# Patient Record
Sex: Female | Born: 1965
Health system: Southern US, Community
[De-identification: ages and names within clinical notes are randomized; demographics above are authoritative.]

## PROBLEM LIST (undated history)

## (undated) DIAGNOSIS — F419 Anxiety disorder, unspecified: Secondary | ICD-10-CM

## (undated) DIAGNOSIS — M7070 Other bursitis of hip, unspecified hip: Secondary | ICD-10-CM

## (undated) DIAGNOSIS — R112 Nausea with vomiting, unspecified: Secondary | ICD-10-CM

## (undated) DIAGNOSIS — R7303 Prediabetes: Secondary | ICD-10-CM

## (undated) DIAGNOSIS — Z973 Presence of spectacles and contact lenses: Secondary | ICD-10-CM

## (undated) DIAGNOSIS — M1990 Unspecified osteoarthritis, unspecified site: Secondary | ICD-10-CM

## (undated) DIAGNOSIS — R87629 Unspecified abnormal cytological findings in specimens from vagina: Secondary | ICD-10-CM

## (undated) DIAGNOSIS — N950 Postmenopausal bleeding: Secondary | ICD-10-CM

## (undated) DIAGNOSIS — Z9889 Other specified postprocedural states: Secondary | ICD-10-CM

## (undated) DIAGNOSIS — F32A Depression, unspecified: Secondary | ICD-10-CM

## (undated) DIAGNOSIS — I8390 Asymptomatic varicose veins of unspecified lower extremity: Secondary | ICD-10-CM

## (undated) DIAGNOSIS — M5441 Lumbago with sciatica, right side: Secondary | ICD-10-CM

## (undated) DIAGNOSIS — F3281 Premenstrual dysphoric disorder: Secondary | ICD-10-CM

## (undated) DIAGNOSIS — R51 Headache: Secondary | ICD-10-CM

## (undated) DIAGNOSIS — Z8742 Personal history of other diseases of the female genital tract: Secondary | ICD-10-CM

## (undated) DIAGNOSIS — Z87442 Personal history of urinary calculi: Secondary | ICD-10-CM

## (undated) DIAGNOSIS — E785 Hyperlipidemia, unspecified: Secondary | ICD-10-CM

## (undated) DIAGNOSIS — M5442 Lumbago with sciatica, left side: Secondary | ICD-10-CM

## (undated) DIAGNOSIS — F329 Major depressive disorder, single episode, unspecified: Secondary | ICD-10-CM

## (undated) HISTORY — DX: Unspecified abnormal cytological findings in specimens from vagina: R87.629

## (undated) HISTORY — PX: BREAST CYST EXCISION: SHX579

## (undated) HISTORY — DX: Premenstrual dysphoric disorder: F32.81

## (undated) HISTORY — PX: INCISE AND DRAIN ABCESS: PRO64

## (undated) HISTORY — DX: Hyperlipidemia, unspecified: E78.5

## (undated) HISTORY — PX: BLADDER SURGERY: SHX569

## (undated) HISTORY — DX: Major depressive disorder, single episode, unspecified: F32.9

## (undated) HISTORY — DX: Unspecified osteoarthritis, unspecified site: M19.90

## (undated) HISTORY — DX: Depression, unspecified: F32.A

## (undated) HISTORY — DX: Anxiety disorder, unspecified: F41.9

---

## 2001-03-18 ENCOUNTER — Encounter: Admission: RE | Admit: 2001-03-18 | Discharge: 2001-03-18 | Payer: Self-pay | Admitting: Obstetrics & Gynecology

## 2001-11-29 ENCOUNTER — Encounter: Admission: RE | Admit: 2001-11-29 | Discharge: 2001-11-29 | Payer: Self-pay | Admitting: *Deleted

## 2002-07-04 ENCOUNTER — Encounter: Admission: RE | Admit: 2002-07-04 | Discharge: 2002-07-04 | Payer: Self-pay | Admitting: *Deleted

## 2002-07-13 HISTORY — PX: TUBAL LIGATION: SHX77

## 2002-08-03 ENCOUNTER — Inpatient Hospital Stay (HOSPITAL_COMMUNITY): Admission: AD | Admit: 2002-08-03 | Discharge: 2002-08-03 | Payer: Self-pay | Admitting: *Deleted

## 2002-10-12 DIAGNOSIS — Z86718 Personal history of other venous thrombosis and embolism: Secondary | ICD-10-CM

## 2002-10-12 HISTORY — DX: Personal history of other venous thrombosis and embolism: Z86.718

## 2002-10-24 ENCOUNTER — Emergency Department (HOSPITAL_COMMUNITY): Admission: EM | Admit: 2002-10-24 | Discharge: 2002-10-24 | Payer: Self-pay

## 2002-10-26 ENCOUNTER — Inpatient Hospital Stay (HOSPITAL_COMMUNITY): Admission: EM | Admit: 2002-10-26 | Discharge: 2002-10-27 | Payer: Self-pay | Admitting: Emergency Medicine

## 2002-11-01 ENCOUNTER — Encounter: Admission: RE | Admit: 2002-11-01 | Discharge: 2002-11-01 | Payer: Self-pay | Admitting: *Deleted

## 2002-11-02 ENCOUNTER — Encounter: Admission: RE | Admit: 2002-11-02 | Discharge: 2002-11-02 | Payer: Self-pay | Admitting: Obstetrics and Gynecology

## 2002-11-07 ENCOUNTER — Encounter: Payer: Self-pay | Admitting: *Deleted

## 2002-11-07 ENCOUNTER — Encounter: Admission: RE | Admit: 2002-11-07 | Discharge: 2002-11-07 | Payer: Self-pay | Admitting: *Deleted

## 2002-11-07 ENCOUNTER — Ambulatory Visit (HOSPITAL_COMMUNITY): Admission: RE | Admit: 2002-11-07 | Discharge: 2002-11-07 | Payer: Self-pay | Admitting: *Deleted

## 2002-11-20 ENCOUNTER — Encounter: Admission: RE | Admit: 2002-11-20 | Discharge: 2002-11-20 | Payer: Self-pay | Admitting: *Deleted

## 2002-11-22 ENCOUNTER — Encounter: Admission: RE | Admit: 2002-11-22 | Discharge: 2002-11-22 | Payer: Self-pay | Admitting: *Deleted

## 2002-11-29 ENCOUNTER — Encounter: Admission: RE | Admit: 2002-11-29 | Discharge: 2002-11-29 | Payer: Self-pay | Admitting: *Deleted

## 2002-12-06 ENCOUNTER — Encounter: Admission: RE | Admit: 2002-12-06 | Discharge: 2002-12-06 | Payer: Self-pay | Admitting: *Deleted

## 2002-12-13 ENCOUNTER — Encounter: Admission: RE | Admit: 2002-12-13 | Discharge: 2002-12-13 | Payer: Self-pay | Admitting: *Deleted

## 2002-12-27 ENCOUNTER — Encounter: Admission: RE | Admit: 2002-12-27 | Discharge: 2002-12-27 | Payer: Self-pay | Admitting: *Deleted

## 2003-01-03 ENCOUNTER — Ambulatory Visit (HOSPITAL_COMMUNITY): Admission: RE | Admit: 2003-01-03 | Discharge: 2003-01-03 | Payer: Self-pay | Admitting: *Deleted

## 2003-01-10 ENCOUNTER — Encounter: Admission: RE | Admit: 2003-01-10 | Discharge: 2003-01-10 | Payer: Self-pay | Admitting: *Deleted

## 2003-01-17 ENCOUNTER — Encounter: Admission: RE | Admit: 2003-01-17 | Discharge: 2003-01-17 | Payer: Self-pay | Admitting: *Deleted

## 2003-01-21 ENCOUNTER — Inpatient Hospital Stay (HOSPITAL_COMMUNITY): Admission: AD | Admit: 2003-01-21 | Discharge: 2003-01-21 | Payer: Self-pay | Admitting: *Deleted

## 2003-01-25 ENCOUNTER — Encounter: Admission: RE | Admit: 2003-01-25 | Discharge: 2003-01-25 | Payer: Self-pay | Admitting: Family Medicine

## 2003-02-01 ENCOUNTER — Encounter: Admission: RE | Admit: 2003-02-01 | Discharge: 2003-02-01 | Payer: Self-pay | Admitting: Family Medicine

## 2003-02-06 ENCOUNTER — Inpatient Hospital Stay (HOSPITAL_COMMUNITY): Admission: AD | Admit: 2003-02-06 | Discharge: 2003-02-06 | Payer: Self-pay | Admitting: Obstetrics and Gynecology

## 2003-02-08 ENCOUNTER — Encounter: Admission: RE | Admit: 2003-02-08 | Discharge: 2003-02-08 | Payer: Self-pay | Admitting: Family Medicine

## 2003-02-09 ENCOUNTER — Ambulatory Visit (HOSPITAL_COMMUNITY): Admission: RE | Admit: 2003-02-09 | Discharge: 2003-02-09 | Payer: Self-pay | Admitting: *Deleted

## 2003-02-15 ENCOUNTER — Encounter: Admission: RE | Admit: 2003-02-15 | Discharge: 2003-02-15 | Payer: Self-pay | Admitting: *Deleted

## 2003-03-01 ENCOUNTER — Encounter: Admission: RE | Admit: 2003-03-01 | Discharge: 2003-03-01 | Payer: Self-pay | Admitting: Family Medicine

## 2003-03-08 ENCOUNTER — Encounter: Admission: RE | Admit: 2003-03-08 | Discharge: 2003-03-08 | Payer: Self-pay | Admitting: *Deleted

## 2003-03-15 ENCOUNTER — Encounter: Admission: RE | Admit: 2003-03-15 | Discharge: 2003-03-15 | Payer: Self-pay | Admitting: Family Medicine

## 2003-03-20 ENCOUNTER — Ambulatory Visit (HOSPITAL_COMMUNITY): Admission: RE | Admit: 2003-03-20 | Discharge: 2003-03-20 | Payer: Self-pay | Admitting: *Deleted

## 2003-03-22 ENCOUNTER — Encounter: Admission: RE | Admit: 2003-03-22 | Discharge: 2003-03-22 | Payer: Self-pay | Admitting: Family Medicine

## 2003-03-29 ENCOUNTER — Encounter: Admission: RE | Admit: 2003-03-29 | Discharge: 2003-03-29 | Payer: Self-pay | Admitting: *Deleted

## 2003-03-31 ENCOUNTER — Inpatient Hospital Stay (HOSPITAL_COMMUNITY): Admission: AD | Admit: 2003-03-31 | Discharge: 2003-03-31 | Payer: Self-pay | Admitting: Obstetrics & Gynecology

## 2003-04-01 ENCOUNTER — Inpatient Hospital Stay (HOSPITAL_COMMUNITY): Admission: AD | Admit: 2003-04-01 | Discharge: 2003-04-05 | Payer: Self-pay

## 2003-04-02 ENCOUNTER — Encounter (INDEPENDENT_AMBULATORY_CARE_PROVIDER_SITE_OTHER): Payer: Self-pay | Admitting: Specialist

## 2003-04-09 ENCOUNTER — Inpatient Hospital Stay (HOSPITAL_COMMUNITY): Admission: AD | Admit: 2003-04-09 | Discharge: 2003-04-09 | Payer: Self-pay | Admitting: Obstetrics and Gynecology

## 2003-04-11 ENCOUNTER — Inpatient Hospital Stay (HOSPITAL_COMMUNITY): Admission: AD | Admit: 2003-04-11 | Discharge: 2003-04-11 | Payer: Self-pay | Admitting: Obstetrics and Gynecology

## 2003-04-12 ENCOUNTER — Encounter: Admission: RE | Admit: 2003-04-12 | Discharge: 2003-04-12 | Payer: Self-pay | Admitting: Internal Medicine

## 2003-04-16 ENCOUNTER — Encounter: Admission: RE | Admit: 2003-04-16 | Discharge: 2003-04-16 | Payer: Self-pay | Admitting: Internal Medicine

## 2003-04-23 ENCOUNTER — Encounter: Admission: RE | Admit: 2003-04-23 | Discharge: 2003-04-23 | Payer: Self-pay | Admitting: Internal Medicine

## 2003-04-30 ENCOUNTER — Encounter: Admission: RE | Admit: 2003-04-30 | Discharge: 2003-04-30 | Payer: Self-pay | Admitting: Internal Medicine

## 2003-05-04 ENCOUNTER — Inpatient Hospital Stay (HOSPITAL_COMMUNITY): Admission: AD | Admit: 2003-05-04 | Discharge: 2003-05-04 | Payer: Self-pay | Admitting: *Deleted

## 2003-05-14 ENCOUNTER — Encounter: Admission: RE | Admit: 2003-05-14 | Discharge: 2003-05-14 | Payer: Self-pay | Admitting: Internal Medicine

## 2003-05-24 ENCOUNTER — Encounter: Admission: RE | Admit: 2003-05-24 | Discharge: 2003-05-24 | Payer: Self-pay | Admitting: Obstetrics and Gynecology

## 2003-05-24 ENCOUNTER — Encounter: Admission: RE | Admit: 2003-05-24 | Discharge: 2003-05-24 | Payer: Self-pay | Admitting: Internal Medicine

## 2003-06-04 ENCOUNTER — Encounter: Admission: RE | Admit: 2003-06-04 | Discharge: 2003-06-04 | Payer: Self-pay | Admitting: Internal Medicine

## 2003-06-14 ENCOUNTER — Ambulatory Visit (HOSPITAL_COMMUNITY): Admission: RE | Admit: 2003-06-14 | Discharge: 2003-06-14 | Payer: Self-pay | Admitting: Internal Medicine

## 2003-06-14 ENCOUNTER — Encounter: Admission: RE | Admit: 2003-06-14 | Discharge: 2003-06-14 | Payer: Self-pay | Admitting: Internal Medicine

## 2003-06-15 ENCOUNTER — Encounter: Admission: RE | Admit: 2003-06-15 | Discharge: 2003-06-15 | Payer: Self-pay | Admitting: Internal Medicine

## 2003-06-18 ENCOUNTER — Ambulatory Visit (HOSPITAL_COMMUNITY): Admission: RE | Admit: 2003-06-18 | Discharge: 2003-06-18 | Payer: Self-pay | Admitting: Internal Medicine

## 2003-06-22 ENCOUNTER — Encounter: Admission: RE | Admit: 2003-06-22 | Discharge: 2003-06-22 | Payer: Self-pay | Admitting: Internal Medicine

## 2003-06-25 ENCOUNTER — Encounter: Admission: RE | Admit: 2003-06-25 | Discharge: 2003-06-25 | Payer: Self-pay | Admitting: Internal Medicine

## 2003-06-29 ENCOUNTER — Encounter: Admission: RE | Admit: 2003-06-29 | Discharge: 2003-06-29 | Payer: Self-pay | Admitting: Internal Medicine

## 2003-08-20 ENCOUNTER — Encounter: Admission: RE | Admit: 2003-08-20 | Discharge: 2003-08-20 | Payer: Self-pay | Admitting: Internal Medicine

## 2003-10-19 ENCOUNTER — Ambulatory Visit (HOSPITAL_COMMUNITY): Admission: RE | Admit: 2003-10-19 | Discharge: 2003-10-19 | Payer: Self-pay | Admitting: Internal Medicine

## 2003-10-19 ENCOUNTER — Encounter: Admission: RE | Admit: 2003-10-19 | Discharge: 2003-10-19 | Payer: Self-pay | Admitting: Internal Medicine

## 2003-11-23 ENCOUNTER — Encounter: Admission: RE | Admit: 2003-11-23 | Discharge: 2003-11-23 | Payer: Self-pay | Admitting: Internal Medicine

## 2003-12-04 ENCOUNTER — Encounter: Admission: RE | Admit: 2003-12-04 | Discharge: 2003-12-04 | Payer: Self-pay | Admitting: Internal Medicine

## 2003-12-05 ENCOUNTER — Emergency Department (HOSPITAL_COMMUNITY): Admission: EM | Admit: 2003-12-05 | Discharge: 2003-12-05 | Payer: Self-pay | Admitting: Family Medicine

## 2003-12-13 ENCOUNTER — Encounter: Admission: RE | Admit: 2003-12-13 | Discharge: 2003-12-13 | Payer: Self-pay | Admitting: Internal Medicine

## 2003-12-20 ENCOUNTER — Encounter: Admission: RE | Admit: 2003-12-20 | Discharge: 2003-12-20 | Payer: Self-pay | Admitting: Internal Medicine

## 2003-12-27 ENCOUNTER — Encounter: Admission: RE | Admit: 2003-12-27 | Discharge: 2003-12-27 | Payer: Self-pay | Admitting: Internal Medicine

## 2004-01-07 ENCOUNTER — Encounter: Admission: RE | Admit: 2004-01-07 | Discharge: 2004-01-07 | Payer: Self-pay | Admitting: Internal Medicine

## 2004-01-28 ENCOUNTER — Encounter: Admission: RE | Admit: 2004-01-28 | Discharge: 2004-01-28 | Payer: Self-pay | Admitting: Internal Medicine

## 2004-02-11 ENCOUNTER — Encounter: Admission: RE | Admit: 2004-02-11 | Discharge: 2004-02-11 | Payer: Self-pay | Admitting: Internal Medicine

## 2004-02-20 ENCOUNTER — Encounter: Admission: RE | Admit: 2004-02-20 | Discharge: 2004-02-20 | Payer: Self-pay | Admitting: Internal Medicine

## 2004-03-10 ENCOUNTER — Encounter: Admission: RE | Admit: 2004-03-10 | Discharge: 2004-03-10 | Payer: Self-pay | Admitting: Internal Medicine

## 2004-03-31 ENCOUNTER — Ambulatory Visit: Payer: Self-pay | Admitting: Internal Medicine

## 2004-04-21 ENCOUNTER — Ambulatory Visit: Payer: Self-pay | Admitting: Internal Medicine

## 2004-05-19 ENCOUNTER — Ambulatory Visit: Payer: Self-pay | Admitting: Internal Medicine

## 2004-05-30 ENCOUNTER — Ambulatory Visit (HOSPITAL_COMMUNITY): Admission: RE | Admit: 2004-05-30 | Discharge: 2004-05-30 | Payer: Self-pay | Admitting: Internal Medicine

## 2004-05-30 ENCOUNTER — Ambulatory Visit: Payer: Self-pay | Admitting: Internal Medicine

## 2004-05-30 ENCOUNTER — Encounter: Payer: Self-pay | Admitting: Family Medicine

## 2004-06-02 ENCOUNTER — Ambulatory Visit: Payer: Self-pay | Admitting: Internal Medicine

## 2004-06-30 ENCOUNTER — Ambulatory Visit: Payer: Self-pay | Admitting: Internal Medicine

## 2004-07-28 ENCOUNTER — Ambulatory Visit: Payer: Self-pay | Admitting: Internal Medicine

## 2004-08-25 ENCOUNTER — Ambulatory Visit: Payer: Self-pay | Admitting: Internal Medicine

## 2004-08-27 ENCOUNTER — Ambulatory Visit: Payer: Self-pay | Admitting: Internal Medicine

## 2004-08-28 ENCOUNTER — Ambulatory Visit: Payer: Self-pay | Admitting: Internal Medicine

## 2004-09-03 ENCOUNTER — Ambulatory Visit: Payer: Self-pay | Admitting: Internal Medicine

## 2004-09-08 ENCOUNTER — Ambulatory Visit: Payer: Self-pay | Admitting: Internal Medicine

## 2004-09-10 ENCOUNTER — Ambulatory Visit (HOSPITAL_COMMUNITY): Admission: RE | Admit: 2004-09-10 | Discharge: 2004-09-10 | Payer: Self-pay | Admitting: Internal Medicine

## 2004-10-06 ENCOUNTER — Ambulatory Visit: Payer: Self-pay | Admitting: Internal Medicine

## 2004-11-03 ENCOUNTER — Ambulatory Visit: Payer: Self-pay | Admitting: Internal Medicine

## 2004-11-11 ENCOUNTER — Ambulatory Visit: Payer: Self-pay | Admitting: Internal Medicine

## 2004-12-01 ENCOUNTER — Ambulatory Visit: Payer: Self-pay | Admitting: Internal Medicine

## 2004-12-02 ENCOUNTER — Ambulatory Visit: Payer: Self-pay | Admitting: Internal Medicine

## 2004-12-09 ENCOUNTER — Other Ambulatory Visit: Admission: RE | Admit: 2004-12-09 | Discharge: 2004-12-09 | Payer: Self-pay | Admitting: Internal Medicine

## 2004-12-09 ENCOUNTER — Encounter (INDEPENDENT_AMBULATORY_CARE_PROVIDER_SITE_OTHER): Payer: Self-pay | Admitting: *Deleted

## 2004-12-09 ENCOUNTER — Ambulatory Visit: Payer: Self-pay | Admitting: Internal Medicine

## 2004-12-11 ENCOUNTER — Ambulatory Visit: Payer: Self-pay | Admitting: Internal Medicine

## 2004-12-15 ENCOUNTER — Ambulatory Visit: Payer: Self-pay | Admitting: Internal Medicine

## 2004-12-16 ENCOUNTER — Ambulatory Visit: Payer: Self-pay | Admitting: Internal Medicine

## 2005-01-05 ENCOUNTER — Ambulatory Visit: Payer: Self-pay | Admitting: Internal Medicine

## 2005-02-02 ENCOUNTER — Ambulatory Visit: Payer: Self-pay | Admitting: Internal Medicine

## 2005-03-02 ENCOUNTER — Inpatient Hospital Stay (HOSPITAL_COMMUNITY): Admission: EM | Admit: 2005-03-02 | Discharge: 2005-03-04 | Payer: Self-pay | Admitting: Emergency Medicine

## 2005-03-02 ENCOUNTER — Ambulatory Visit: Payer: Self-pay | Admitting: Obstetrics & Gynecology

## 2005-03-02 ENCOUNTER — Encounter: Payer: Self-pay | Admitting: Obstetrics and Gynecology

## 2005-03-04 ENCOUNTER — Ambulatory Visit: Payer: Self-pay | Admitting: Oncology

## 2005-03-19 ENCOUNTER — Ambulatory Visit: Payer: Self-pay | Admitting: Family Medicine

## 2005-03-24 ENCOUNTER — Ambulatory Visit (HOSPITAL_COMMUNITY): Admission: RE | Admit: 2005-03-24 | Discharge: 2005-03-24 | Payer: Self-pay | Admitting: Family Medicine

## 2005-03-26 ENCOUNTER — Ambulatory Visit: Payer: Self-pay | Admitting: Internal Medicine

## 2005-04-09 ENCOUNTER — Ambulatory Visit: Payer: Self-pay | Admitting: Internal Medicine

## 2005-10-20 ENCOUNTER — Ambulatory Visit: Payer: Self-pay | Admitting: Hospitalist

## 2005-10-21 ENCOUNTER — Ambulatory Visit: Payer: Self-pay | Admitting: Internal Medicine

## 2006-01-14 ENCOUNTER — Encounter (INDEPENDENT_AMBULATORY_CARE_PROVIDER_SITE_OTHER): Payer: Self-pay | Admitting: Internal Medicine

## 2006-01-14 ENCOUNTER — Ambulatory Visit: Payer: Self-pay | Admitting: Internal Medicine

## 2006-01-21 ENCOUNTER — Encounter: Admission: RE | Admit: 2006-01-21 | Discharge: 2006-01-21 | Payer: Self-pay | Admitting: Internal Medicine

## 2006-01-21 ENCOUNTER — Encounter (INDEPENDENT_AMBULATORY_CARE_PROVIDER_SITE_OTHER): Payer: Self-pay | Admitting: Internal Medicine

## 2006-04-28 DIAGNOSIS — R6882 Decreased libido: Secondary | ICD-10-CM | POA: Insufficient documentation

## 2006-04-28 DIAGNOSIS — F329 Major depressive disorder, single episode, unspecified: Secondary | ICD-10-CM | POA: Insufficient documentation

## 2006-04-28 DIAGNOSIS — N760 Acute vaginitis: Secondary | ICD-10-CM | POA: Insufficient documentation

## 2006-04-28 DIAGNOSIS — N643 Galactorrhea not associated with childbirth: Secondary | ICD-10-CM | POA: Insufficient documentation

## 2006-04-28 DIAGNOSIS — F3289 Other specified depressive episodes: Secondary | ICD-10-CM | POA: Insufficient documentation

## 2008-06-05 ENCOUNTER — Ambulatory Visit: Payer: Self-pay | Admitting: Internal Medicine

## 2008-06-05 ENCOUNTER — Encounter (INDEPENDENT_AMBULATORY_CARE_PROVIDER_SITE_OTHER): Payer: Self-pay | Admitting: Internal Medicine

## 2008-06-05 DIAGNOSIS — M25569 Pain in unspecified knee: Secondary | ICD-10-CM | POA: Insufficient documentation

## 2008-06-06 LAB — CONVERTED CEMR LAB
ALT: 12 units/L (ref 0–35)
AST: 14 units/L (ref 0–37)
Albumin: 3.9 g/dL (ref 3.5–5.2)
Alkaline Phosphatase: 61 units/L (ref 39–117)
BUN: 17 mg/dL (ref 6–23)
CO2: 19 meq/L (ref 19–32)
Calcium: 8.3 mg/dL — ABNORMAL LOW (ref 8.4–10.5)
Chloride: 106 meq/L (ref 96–112)
Cholesterol: 213 mg/dL — ABNORMAL HIGH (ref 0–200)
Creatinine, Ser: 0.7 mg/dL (ref 0.40–1.20)
Free T4: 1.21 ng/dL (ref 0.89–1.80)
Glucose, Bld: 92 mg/dL (ref 70–99)
HCT: 42.9 % (ref 36.0–46.0)
HDL: 45 mg/dL (ref >39)
Hemoglobin: 14.2 g/dL (ref 12.0–15.0)
LDL Cholesterol: 143 mg/dL — ABNORMAL HIGH (ref 0–99)
MCHC: 33.1 g/dL (ref 30.0–36.0)
MCV: 94.3 fL (ref 78.0–100.0)
Platelets: 296 10*3/uL (ref 150–400)
Potassium: 4.4 meq/L (ref 3.5–5.3)
RBC: 4.55 M/uL (ref 3.87–5.11)
RDW: 13.9 % (ref 11.5–15.5)
Sodium: 138 meq/L (ref 135–145)
TSH: 0.488 microintl units/mL (ref 0.350–4.50)
Total Bilirubin: 0.4 mg/dL (ref 0.3–1.2)
Total CHOL/HDL Ratio: 4.7
Total Protein: 6.6 g/dL (ref 6.0–8.3)
Triglycerides: 127 mg/dL (ref <150)
VLDL: 25 mg/dL (ref 0–40)
WBC: 4.3 10*3/uL (ref 4.0–10.5)

## 2008-07-17 ENCOUNTER — Encounter: Payer: Self-pay | Admitting: Gynecology

## 2008-07-17 ENCOUNTER — Other Ambulatory Visit: Admission: RE | Admit: 2008-07-17 | Discharge: 2008-07-17 | Payer: Self-pay | Admitting: Gynecology

## 2008-07-17 ENCOUNTER — Encounter (INDEPENDENT_AMBULATORY_CARE_PROVIDER_SITE_OTHER): Payer: Self-pay | Admitting: Internal Medicine

## 2008-07-17 ENCOUNTER — Ambulatory Visit: Payer: Self-pay | Admitting: Gynecology

## 2008-07-18 ENCOUNTER — Encounter: Admission: RE | Admit: 2008-07-18 | Discharge: 2008-07-18 | Payer: Self-pay | Admitting: Gynecology

## 2008-08-08 ENCOUNTER — Ambulatory Visit: Payer: Self-pay | Admitting: Gynecology

## 2008-08-15 ENCOUNTER — Ambulatory Visit: Payer: Self-pay | Admitting: Gynecology

## 2008-08-29 ENCOUNTER — Encounter: Admission: RE | Admit: 2008-08-29 | Discharge: 2008-08-29 | Payer: Self-pay | Admitting: Family Medicine

## 2009-08-27 ENCOUNTER — Ambulatory Visit: Payer: Self-pay | Admitting: Gynecology

## 2009-08-27 ENCOUNTER — Other Ambulatory Visit: Admission: RE | Admit: 2009-08-27 | Discharge: 2009-08-27 | Payer: Self-pay | Admitting: Gynecology

## 2009-08-28 ENCOUNTER — Encounter: Admission: RE | Admit: 2009-08-28 | Discharge: 2009-08-28 | Payer: Self-pay | Admitting: Gynecology

## 2009-08-29 ENCOUNTER — Ambulatory Visit: Payer: Self-pay | Admitting: Gynecology

## 2009-11-11 ENCOUNTER — Ambulatory Visit: Payer: Self-pay | Admitting: Gynecology

## 2009-12-10 ENCOUNTER — Ambulatory Visit: Payer: Self-pay | Admitting: Gynecology

## 2010-08-03 ENCOUNTER — Encounter: Payer: Self-pay | Admitting: Family Medicine

## 2010-08-12 NOTE — Assessment & Plan Note (Signed)
Summary: RA/COMPL PHYSICAL/NOT SEEN SINCE 07/VS   Vital Signs:  Patient Profile:   45 Years Old Female Height:     61 inches (154.94 cm) Weight:      162.05 pounds (73.66 kg) BMI:     30.73 Temp:     96.8 degrees F (36 degrees C) oral Pulse rate:   53 / minute BP sitting:   94 / 57  (right arm)  Pt. in pain?   no  Vitals Entered By: Angelina Ok RN (June 05, 2008 8:35 AM)              Is Patient Diabetic? No Nutritional Status BMI of > 30 = obese  Have you ever been in a relationship where you felt threatened, hurt or afraid?No   Does patient need assistance? Functional Status Self care Ambulation Normal     Visit Type:  new visit  PCP:  Carlus Pavlov MD  Chief Complaint:  Check up Standing 86/56 P 58.  History of Present Illness: This is a 45 y/o woman with PMH of  VAGINITIS (ICD-616.10) DECREASED LIBIDO (ICD-799.81) TUBAL LIGATION, HX OF (ICD-V26.51) GALACTORRHEA (ICD-611.6) DVT (ICD-451.19) DEPRESSION (ICD-311) lost for f/u since EMR (2007), now presenting for a complete physical.  She was on coumadin for a DVT that she had while she was pregnant in 2004. She is not on it anymore. No more DVTs.  No recent hospitalizations. No more vaginal problems. She still feels depressed but much better. She has been started on a med for depression in the past by [place], but does not remember what med. She is now taking Remotiv (hypericum perforatum, [place], likely [place] John's Wort) from [place]. She started taking them 1 month ago and they work, she says. She has a lot of stress at work.  She also takes Meloxicam for right knee pain. She seen DR. John Rendall (ortho). She is on an exercise program by him.     Depression History:      The patient is having a depressed mood most of the day but denies diminished interest in her usual daily activities.        The patient denies that she feels like life is not worth living, denies that she wishes that she were  dead, and denies that she has thought about ending her life.        Comments:  Feeling better since on medications.  Thinking about  her child.  .     Prior Medications Reviewed Using: Medication Bottles  Updated Prior Medication List: ST JOHNS WORT 300 MG TABS (ST JOHNS WORT) Take 1 tablet by mouth two times a day MELOXICAM 7.5 MG TABS (MELOXICAM) Take 1 tablet by mouth once a day as needed for pain * DIACEREINE-MELOXICAM 50-15 MG take 1 tablet by mouth as needed for pain  Current Allergies: No known allergies    Family History:    Mother: DM2, diagnosed 01/2008    Grandmother: DM2    Father: killed long time ago    Children: 3, healthy: 4,17 and 29 y/o  Social History:    Married, 3 children    Works in a [employer] on [employer]    Never Smoked    Alcohol use-no    Drug use-no    Regular exercise-no    Lives in [place]   Risk Factors:  Tobacco use:  never Drug use:  no Alcohol use:  no Exercise:  no   Review of Systems  She had sore throat 2 weeks ago. She had the flu shot.  No F/C/N/V/D/C/CP/SOB/AP/dysuria/vaginal discharge/blood in stool or urine/black stools She has leg swelling especially when she stands a lot (works in [employer]).   Physical Exam  General:     alert and overweight-appearing.   Eyes:     vision grossly intact, pupils equal, pupils round, and pupils reactive to light.   Mouth:     pharynx pink and moist.   Lungs:     normal respiratory effort, no crackles, and no wheezes.   Heart:     normal rate, regular rhythm, no murmur, and no gallop.   Abdomen:     soft, non-tender, normal bowel sounds, no distention, no guarding, and no rigidity.   Extremities:     no edema varices left calf Psych:     pleasant, normally interactive, nicely dressed    Impression & Recommendations:  Problem # 1:  DEPRESSION (ICD-311) Assessment: Improved Her depression seems to be triggered by the stress at work. St John's  Wort is working well for her and I encouraged her to continue since there are data showing that it is moderately effective in depression and no SEs.  Orders: T-TSH [id number]) T-T4, Free [phone])   Problem # 2:  Preventive Health Care (ICD-V70.0) Assessment: Comment Only Mammogram: had a mammogram last year. She goes to [facility] and gets letters in mail to get them.  PAP: last year in January. She has her period now but agrees to have them done by [place].   Problem # 3:  KNEE PAIN, RIGHT, CHRONIC (ICD-719.46) Assessment: Unchanged Pt has OA with chronic knee pain and sees an orthopedist. He prescribed Meloxicam 7.5 mg daily but pt is also taking a combination tablet that contains meloxicam 15 mg and she takes it two times a day. That mounts to 37.5 mg Meloxicam a day! Max dose is 15! Explained to her that she can take 7.5 two times a day or 15 daily but not more than that. She agrees.  Her updated medication list for this problem includes:    Meloxicam 7.5 Mg Tabs (Meloxicam) .Marland Kitchen... Take 1 tablet by mouth once a day as needed for pain   Complete Medication List: 1)  St Johns Wort 300 Mg Tabs ([address] johns wort) .... Take 1 tablet by mouth two times a day 2)  Meloxicam 7.5 Mg Tabs (Meloxicam) .... Take 1 tablet by mouth once a day as needed for pain 3)  Diacereine-meloxicam 50-15 Mg  .... Take 1 tablet by mouth as needed for pain  Other Orders: T-Comprehensive Metabolic Panel [id number]) T-Lipid Profile ([id number]) T-CBC No Diff ([id number])   Patient Instructions: 1)  Please schedule a follow-up appointment in 3 months for a PAP smear and general check-up.   ]

## 2010-08-13 ENCOUNTER — Other Ambulatory Visit: Payer: Self-pay | Admitting: Gynecology

## 2010-08-13 DIAGNOSIS — Z1239 Encounter for other screening for malignant neoplasm of breast: Secondary | ICD-10-CM

## 2010-09-01 ENCOUNTER — Other Ambulatory Visit (HOSPITAL_COMMUNITY)
Admission: RE | Admit: 2010-09-01 | Discharge: 2010-09-01 | Disposition: A | Discharge status: Home / Self Care | Payer: BC Managed Care – PPO | Source: Ambulatory Visit | Attending: Gynecology | Admitting: Gynecology

## 2010-09-01 ENCOUNTER — Other Ambulatory Visit: Payer: Self-pay | Admitting: Gynecology

## 2010-09-01 ENCOUNTER — Encounter (INDEPENDENT_AMBULATORY_CARE_PROVIDER_SITE_OTHER): Payer: BC Managed Care – PPO | Admitting: Gynecology

## 2010-09-01 DIAGNOSIS — Z833 Family history of diabetes mellitus: Secondary | ICD-10-CM

## 2010-09-01 DIAGNOSIS — B373 Candidiasis of vulva and vagina: Secondary | ICD-10-CM

## 2010-09-01 DIAGNOSIS — Z13220 Encounter for screening for lipoid disorders: Secondary | ICD-10-CM

## 2010-09-01 DIAGNOSIS — Z01419 Encounter for gynecological examination (general) (routine) without abnormal findings: Secondary | ICD-10-CM

## 2010-09-01 DIAGNOSIS — Z124 Encounter for screening for malignant neoplasm of cervix: Secondary | ICD-10-CM | POA: Insufficient documentation

## 2010-09-01 DIAGNOSIS — R635 Abnormal weight gain: Secondary | ICD-10-CM

## 2010-09-05 ENCOUNTER — Institutional Professional Consult (permissible substitution) (INDEPENDENT_AMBULATORY_CARE_PROVIDER_SITE_OTHER): Payer: BC Managed Care – PPO | Admitting: Gynecology

## 2010-09-05 DIAGNOSIS — E7800 Pure hypercholesterolemia, unspecified: Secondary | ICD-10-CM

## 2010-10-06 ENCOUNTER — Ambulatory Visit
Admission: RE | Admit: 2010-10-06 | Discharge: 2010-10-06 | Disposition: A | Discharge status: Home / Self Care | Payer: BC Managed Care – PPO | Source: Ambulatory Visit | Attending: Gynecology | Admitting: Gynecology

## 2010-10-06 DIAGNOSIS — Z1239 Encounter for other screening for malignant neoplasm of breast: Secondary | ICD-10-CM

## 2010-11-28 NOTE — Consult Note (Signed)
NAME:  Taylor Parker     ACCOUNT NO.:  [id number]   MEDICAL RECORD NO.:  [id number]          PATIENT TYPE:  INP   LOCATION:  1621                         FACILITY:  [facility]   PHYSICIAN:  Leighton Roach. Truett Perna, M.D. DATE OF BIRTH:  1966-06-06   DATE OF CONSULTATION:  03/02/2005  DATE OF DISCHARGE:                                   CONSULTATION   REFERRED BY:  Velora Heckler, MD.   PATIENT IDENTIFICATION:  [name] is a 45 year old admitted for evaluation  of possible appendicitis. She is maintained on Coumadin and the PTT is  supratherapeutic.   HISTORY OF PRESENT ILLNESS:  [name] was diagnosed with a deep vein  thrombosis of the left leg while pregnant in April 2004. She was treated  with subcutaneous heparin throughout the pregnancy and started Coumadin  after delivery in September 2004.   She has been maintained on Coumadin for the past 2 years. She is followed at  the [facility].   [name] presented today with a 5-day history of right abdominal pain. She  had no associated symptoms. She was evaluated at [facility] and a CT  scan of the abdomen and pelvis revealed changes consistent with appendicitis  and a hemorrhagic ovarian cyst. She was referred to the [facility]  Emergency Room for further evaluation.   PAST MEDICAL HISTORY:  1.  G3, P3, last menstrual cycle was February 01, 2005.  2.  Deep vein thrombosis 2004 while pregnant.   PAST SURGICAL HISTORY:  1.  Status post bilateral tubal ligation.  2.  Status post cesarean section.   ALLERGIES:  DILAUDID causes shortness of breath.   CURRENT MEDICATIONS:  Coumadin 5 mg daily.   FAMILY HISTORY:  No family history of venous thromboembolic disease.   SOCIAL HISTORY:  She does not use tobacco or alcohol. She does not speak  [place].   REVIEW OF SYSTEMS:  Negative for bleeding, fever and nausea/vomiting.   PHYSICAL EXAMINATION:  VITAL SIGNS:  Blood pressure 93/56, pulse 58,  temperature 97, oxygen saturation 99% on room air.  LUNGS:  Clear.  CARDIAC:  Regular rhythm.  ABDOMEN:  Soft. No hepatosplenomegaly. No mass. Mild tenderness with deep  palpation in the right low abdomen.  EXTREMITIES:  No edema. Varicosities at the left leg below the knee.   LABS:  Hemoglobin 11.7, MCV 80.1, white count 7, platelets 262,000, ANC 4.2,  BUN 8, creatinine 0.7, PTT 65 seconds, PT 43.1 seconds. (PT/INR 4.6).   IMPRESSION:  1.  Abdominal pain -? appendicitis.  2.  Hemorrhagic ovarian cyst.  3.  Elevated PT and PTT.  4.  History of left lower extremity deep vein thrombosis, 2004, maintained      on chronic Coumadin anticoagulation.  5.  Microcytic anemia.   [name] is maintained on long-term Coumadin. She presents with right  lower quadrant pain and CT findings suggestive of appendicitis. We will give  vitamin K, mini-dose , to reverse the Coumadin over the next 12-24 hours.  We can give FFP if urgent surgery is needed for treatment of appendicitis or  the ovarian cyst.   We will contact the  medicine clinic to clarify the indication for long-term  anticoagulation in her case. The increased PT and PTT are both likely due to  an effect of Coumadin.   RECOMMENDATIONS:  1.  Vitamin K.  2.  FFP if urgent reversal of Coumadin is needed for surgery.  3.  Check ferritin level.  4.  Repeat PT/PTT in a.m. August 22.  5.  Early ambulation/Lovenox following surgery with the plan to resume      Coumadin based on a review of records from the medicine clinic.   Thank you for this consultation. We will follow [name] with the surgical  service while in the hospital.           ______________________________  Leighton Roach. Truett Perna, M.D.     GBS/MEDQ  D:  03/02/2005  T:  03/03/2005  Job:  [id number]   cc:   Velora Heckler, MD  [address]  [place]  [place] [address]   Lesly Dukes, M.D.   [facility]

## 2010-11-28 NOTE — Op Note (Signed)
NAME:  Taylor Parker, Taylor Parker                 ACCOUNT NO.:  [id number]   MEDICAL RECORD NO.:  [id number]                   PATIENT TYPE:  INP   LOCATION:  9119                                 FACILITY:  [facility]   PHYSICIAN:  Phil D. Okey Dupre, M.D.                  DATE OF BIRTH:  Oct 11, 1965   DATE OF PROCEDURE:  04/02/2003  DATE OF DISCHARGE:                                 OPERATIVE REPORT   PREOPERATIVE DIAGNOSES:  1. Repeat cesarean section.  2. Voluntary sterilization.   POSTOPERATIVE DIAGNOSES:  1. Repeat cesarean section.  2. Voluntary sterilization.   PROCEDURE:  Low flap cesarean section and bilateral tubal ligation, modified  [place].   SURGEON:  Javier Glazier. Rose, M.D.   ESTIMATED BLOOD LOSS:  700 mL.   ANESTHESIA:  Spinal anesthesia.   DESCRIPTION OF PROCEDURE:  Under satisfactory spinal anesthesia with the  patient in the dorsal supine position, Foley catheter in urinary bladder,  the abdomen was prepped and draped in the usual sterile manner and entered  through a midline incision through a previous scar situated just underneath  the umbilicus and extended to the symphysis pubis.  The abdomen was entered  by layers.  After entering the peritoneal cavity, the visceral peritoneum  and the anterior surface of the uterus opened transversely by sharp  dissection.  Bladder pushed away from the lower uterine segment which was  entered by sharp and blunt dissection and from an LOT presentation.  The  baby was easily delivered.  The cord doubly clamped, divided, baby handed to  pediatrician after mouth had been suctioned with a suction bulb.  Samples of  blood taken from the cord for analysis. The placenta spontaneously removed.  The uterus explored and closed with a continuous running locked 0 Vicryl and  atraumatic needle.  Each fallopian tube was then grasped in the mid portion  with Babcock clamp and an opening made in the avascular portion of meso  beneath the tube with a  hemostat through which was drawn a #1 plain catgut  suture tied around the distal and proximal sides in the tube to form a loop  of 2 cm above the tie.  Then the section of tube above the tie was removed  after the second suture was placed beneath the aforementioned and the ends  of the tube coagulated with hot cautery.  The fascia was then closed with a  double strand PDS 0 with a loop and a running suture.  Skin edges  approximated with skin staples.  Dry sterile dressing applied.  There was a  700 mL blood loss.  The Foley catheter draining clear amber urine at the end  of the procedure.  The patient tolerated the procedure well and was  transferred to the recovery room in satisfactory condition.  Phil D. Okey Dupre, M.D.    PDR/MEDQ  D:  04/02/2003  T:  04/02/2003  Job:  [id number]

## 2010-11-28 NOTE — Discharge Summary (Signed)
NAME:  Taylor Parker, Taylor Parker     ACCOUNT NO.:  [id number]   MEDICAL RECORD NO.:  [id number]          PATIENT TYPE:  INP   LOCATION:  1621                         FACILITY:  [facility]   PHYSICIAN:  Velora Heckler, MD      DATE OF BIRTH:  [birth year]   DATE OF ADMISSION:  03/02/2005  DATE OF DISCHARGE:                                 DISCHARGE SUMMARY   REASON FOR ADMISSION:  Abdominal pain, coagulopathy.   HISTORY OF PRESENT ILLNESS:  The patient is 45 year old Hispanic female who  presented to the emergency department [facility] with 5-day history of  lower abdominal pain. The patient was referred to general surgery at [facility]. The patient had been on Coumadin for distant history deep  venous thrombosis. She was found to be coagulopathic with an INR of 4.6.  General surgery was called to evaluate the patient for possible  appendicitis, hemorrhagic ovarian cyst, and coagulopathy.   HOSPITAL COURSE:  The patient was admitted on the general surgical service.  She was seen in consultation by Dr. Mancel Bale from hematology.  Anticoagulation was stopped. Vitamin K was administered. The patient's  coagulopathy gradually resolved. Clinically the patient demonstrated no  signs or symptoms of acute appendicitis, despite CT scan findings. White  blood cell count was normal. Vital signs were perfectly stable. Abdominal  exam was completely benign. The patient had no fever, no chills. The patient  tolerated clear liquid diet was advanced to a regular diet. She was seen in  follow-up by gynecology. She was followed by Dr. Mancel Bale. Review of  her medical records failed to document conclusive evidence of protein S  deficiency. Therefore a decision was made to stop her Coumadin therapy. The  patient was prepared for discharge home on the third hospital day.   DISCHARGE/PLAN:  The patient is discharged home today August 23,2006, good  condition, tolerating a regular diet,  ambulating independently. Discharge  medications include iron sulfate 324 milligrams twice daily for 1 month. The  patient will be seen back in the gynecology clinic in 2 weeks. The patient  will be seen at the [facility] outpatient medicine clinic in 2 weeks.   FINAL DIAGNOSIS:  1.  Abdominal pain, resolved.  2.  Ovarian cyst.  3.  Coagulopathy.   CONDITION ON DISCHARGE:  Improved.      Velora Heckler, MD  Electronically Signed     TMG/MEDQ  D:  03/04/2005  T:  03/04/2005  Job:  [id number]   cc:   Lesly Dukes, M.D.   Leighton Roach. Truett Perna, M.D.  [address] [facility]  [place]  [place] [address]  Fax: [phone]

## 2010-11-28 NOTE — H&P (Signed)
NAME:  Taylor Parker, Taylor Parker     ACCOUNT NO.:  [id number]   MEDICAL RECORD NO.:  [id number]          PATIENT TYPE:  INP   LOCATION:  0105                         FACILITY:  [facility]   PHYSICIAN:  Velora Heckler, MD      DATE OF BIRTH:  04/21/1966   DATE OF ADMISSION:  03/02/2005  DATE OF DISCHARGE:                                HISTORY & PHYSICAL   REFERRING PHYSICIAN:  Lesly Dukes, M.D.   CHIEF COMPLAINT:  Five day history of abdominal pain.   HISTORY OF PRESENT ILLNESS:  Patient is [name] who  presents to the emergency department at [facility] with a five day  history of lower abdominal pain.  Patient denies nausea or vomiting.  She  denies fevers or chills.  She notes normal bowel movements.  Patient was  seen and evaluated in the emergency department.  She was noted to be  coagulopathic on Coumadin with an INR of 4.6 and an elevated PTT of 65.  She  had been on Coumadin for two years since a deep venous thrombosis was  diagnosed during her last pregnancy.  Patient underwent a CT scan of the  abdomen and pelvis, which documented an apparent ovarian cyst with  hemorrhage.  Appendicitis was also felt to be represented on CT scan.  General surgery was called by gynecology, and the patient was transferred  from [facility] to the [facility] emergency department for  general surgical assessment.  Patient is also being seen by Dr. Mancel Bale due to her coagulopathy and possible history of protein S  deficiency.   HISTORY OF PRESENT ILLNESS:  1.  Cesarean section x3.  2.  History of deep venous thrombosis during her third pregnancy in 2004.   MEDICATIONS:  Coumadin.   ALLERGIES:  DILAUDID, causing shortness of breath.   FAMILY HISTORY:  Unremarkable.   SOCIAL HISTORY:  Patient lives in [place].  She has three children.  She  denies tobacco use.  She denies alcohol use.  She speaks limited [place].   REVIEW OF SYSTEMS:  The  15-system review discussed through an interpreter  without significant other positives.   PHYSICAL EXAMINATION:  VITAL SIGNS:  Temp 99.2, pulse 69, respirations 18,  blood pressure 83/47.  GENERAL:  A 45 year old well-developed and well-nourished Hispanic female in  no acute distress on a stretcher in the emergency department.  HEENT:  Normocephalic and atraumatic.  Sclerae are clear.  Conjunctivae are  clear.  Pupils are equal and reactive.  Dentition fair.  Moist mucous  membranes.  Voice is normal.  NECK:  Supple without mass.  Thyroid normal without nodularity.  There is no  tenderness.  There is no lymphadenopathy.  Supraclavicular fossae are  without mass.  LUNGS:  Clear to auscultation bilaterally without rales, rhonchi or wheezes.  HEART:  Regular rate and rhythm without murmur.  Peripheral pulses are full.  ABDOMEN:  Soft without distention.  There is a well-healed lower midline  surgical wound.  There is no sign of hernia.  There is no  hepatosplenomegaly.  There are no palpable masses.  There is no guarding.  There is no rebound.  EXTREMITIES:  Nontender without edema.  NEUROLOGIC:  Patient is alert and oriented without focal deficit.   LABORATORY STUDIES:  White count 7, hemoglobin 11.7, platelet count 262,000.  Differential is normal.  Electrolytes are normal.  PTT is elevated at 65.  INR is elevated at 4.6.  Urinalysis is benign.   RADIOGRAPHIC STUDIES:  CT scan of the abdomen and pelvis by report showing  hemorrhagic ovarian cyst and possible appendicitis.   IMPRESSION:  1.  Coagulopathy secondary to Coumadin therapy.  2.  Hemorrhagic ovarian cyst.  3.  Rule out appendicitis.   PLAN:  The patient has already been seen by Dr. Mancel Bale from  hematology.  He will be reversing her over-anticoagulation.  He will also  make a determination as to whether she has some kind of underlying genetic  coagulopathy or if her anticoagulation therapy may be discontinued safely  at  the present time.  The patient's CT scan of the abdomen and pelvis will be  reviewed this evening with the radiologist.  Clinically, the patient  exhibits no signs of acute appendicitis.  At this point, no operative  intervention is planned.  Coagulopathy will be reversed, and the patient  will be observed carefully here as an inpatient at [facility].  I will notify the gynecologist of this plan.      Velora Heckler, MD  Electronically Signed     TMG/MEDQ  D:  03/02/2005  T:  03/02/2005  Job:  [id number]   cc:   Lesly Dukes, M.D.   Leighton Roach. Truett Perna, M.D.  [address] [facility]  [place]  [place] [address]  Fax: [phone]

## 2010-11-28 NOTE — Consult Note (Signed)
[place]  Patient:    Taylor Parker, Taylor Parker Visit Number: [id number] MRN: [id number]          Service Type: Attending:  Elinor Dodge, M.D. Dictated by:   Elinor Dodge, M.D.                            Consultation Report  CHIEF COMPLAINT:  Abdominal pain for four days.  HISTORY OF PRESENT ILLNESS:  This is [name], last menstrual period on the 10th of May with a 28-30 day cycle who has para 2-0-0-2, a 24- and a 45 year old by cesarean section, used an IUD between the two children, has used no contraception for the past 10 years and has not gotten pregnant. She has this abdominal pain which makes her feel bloated in midcycle each month lasting for about four days, and bothering her more than the abdominal pain is that of low abdominal pain when she tries to sit. She denies urinary or bowel problems.  PHYSICAL EXAMINATION:  ABDOMEN:  Soft, flat, nontender, no masses, no organomegaly, no guarding, no rebound.  PELVIC:  BUS was within normal limits, the vulva is normal, the vagina is clean and well rugated, the cervix is nulliparous, and the uterus is in first-degree retroversion of normal size, shape, consistency, with normal adnexa, not significantly tender. The cul-de-sac is free and the rectal is confirmatory.  IMPRESSION:  A patient with a normal pelvis, intermittent pelvic pain, probable Mittelschmerz. Would suggest a trial on oral contraceptives to see if this did not relieve the problem. The patient had a wet smear and KOH which was negative and a DNA done. Dictated by:   Elinor Dodge, M.D. Attending:  Elinor Dodge, M.D. DD:  11/29/01 TD:  11/30/01 Job: [id number]

## 2010-11-28 NOTE — Discharge Summary (Signed)
NAME:  Taylor Parker, Taylor Parker                 ACCOUNT NO.:  [id number]   MEDICAL RECORD NO.:  [id number]                   PATIENT TYPE:  INP   LOCATION:  9119                                 [facility]   PHYSICIAN:  Lesly Dukes, M.D.              DATE OF BIRTH:  09/12/1965   DATE OF ADMISSION:  04/01/2003  DATE OF DISCHARGE:  04/05/2003                                 DISCHARGE SUMMARY   ADMISSION DIAGNOSES:  1. Term intrauterine pregnancy.  2. Multiparity desirous of sterilization.  3. History of previous cesarean sections desires a trial of labor.  4. Deep vein thrombosis in early pregnancy.   DISCHARGE DIAGNOSES:  1. Repeat low transverse cesarean section.  2. Bilateral tubal ligation.  3. Viable female infant.  4. Deep vein thrombosis with adequate anticoagulation.  5. Severe anemia.   PRESENT HISTORY:  The patient is a 45 year old G3 P2-0-0-2 presented at 76-  3/7 weeks by good dating criteria.  She was having contractions and bleeding  which she reported as red blood noted in the toilet after voiding.  Her  contractions were increasingly painful over the past day.  She had been seen  12 hours prior to admission for a labor check.  She was also reporting right  upper quadrant and right-sided pain since March 29, 2003.  Her source of  care was at the high risk clinic with onset at 18 weeks.  She had a DVT in  her early pregnancy and was hospitalized at [facility] and fully  anticoagulated on heparin.  She was also diagnosed with H. pylori.  Of note  she was advanced maternal age, she declined an amnio, she had two previous C-  sections and she had baseline anemia with an admission hemoglobin of 8.9.   MEDICATIONS:  Heparin 13,000 units subcu q.8h. (her last dose had been about  7 hours prior to admission).  She was not taking her prenatal vitamins  consistently and was not iron.   ALLERGIES:  She had no allergies.   OBSTETRICAL HISTORY:  She had two  C-sections in [place], one was for  nonreassuring fetal heart rate, the second was a scheduled repeat.  Weights  were both AGA.   GYNECOLOGICAL HISTORY:  Negative for STDs.   MEDICAL HISTORY:  DVT as described above.   SURGICAL HISTORY:  C-section x2 with spinal anesthesia both times.  She also  had orthopedic surgery on her knees.   FAMILY HISTORY:  Mother with diabetes.   HABITS:  Negative for tobacco, alcohol, or drugs.   PRENATAL LABORATORIES:  B positive blood type, antibody screen negative.  Hemoglobin 13.4, platelets 167,000.  Rubella immune.  Hepatitis negative.  Syphilis negative.  HIV nonreactive.  GC negative.  Chlamydia negative.  GBS  negative.  She had a negative ANA and also had PIH labs done as a baseline  that were normal.  Her random glucose was 91.   ADMISSION PHYSICAL EXAMINATION:  VITAL  SIGNS:  Temperature was 98.5, pulse  58, respirations 22, BP 101/39, 110/52.  GENERAL:  She looked somewhat uncomfortable.  HEART:  Regular rate and rhythm with a soft flow murmur.  LUNGS:  Clear to auscultation.  ABDOMEN:  Soft and nontender between contractions, estimated fetal weight  was about 8 pounds.  EXTREMITIES:  Without edema, 1+ DTRs.  She had areas of bruising on lower  extremities and trunk.  CERVIX:  External fingertip, 2 cm long, -3, cephalic.  There was a large  clot at the os which was manually removed.  Baseline fetal heart rate was  128 to 125, moderate variability, reactivity was present, she had no  decelerations, contractions were every 5-6 minutes, mild to moderate.   HOSPITAL COURSE:  The patient was admitted for observation, rest, and IV  fluids and the decision was made to stop her heparin.  Dr. Okey Dupre discussed  with her trial of labor.  On April 02, 2003 the patient did decide that  since she had had contractions and no cervical change that she would like to  have a repeat cesarean section and a tubal ligation.  On April 02, 2003  she did  have low transverse cesarean section and BTL, spinal anesthesia, EBL  was 700, infant's Apgars were 9 and 9, pH was 7.33.  Postoperatively she was  seen in conjunction with the pharmacist who had her on Lovenox and Coumadin  and checked her PT and INR's.  She was to continue her Lovenox for 2 days  with a therapeutic overlap with Coumadin.  On postoperative day #3 she was  doing well, having mild after pain, lochia was tapering, she was tolerating  regular food, ambulatory without orthostatic symptoms, and had no bleeding  of significance.  Her BP was 100/50, pulse 78, and she remained afebrile;  lungs were clear; heart regular rate and rhythm without murmur; abdomen was  soft with appropriate incisional tenderness at the vertical scar site,  staples were intact, incision was clean, dry, and intact; her skin was  indurated; fundus was firm; ________ -1 fingerbreadth; she had trace  dependent edema.  PT was 29.5, INR 3.5, hemoglobin had been 6.2 at its nadir  and on day of discharge it was 7.1.  She was supertherapeutic with her 2-day  overlap of Lovenox and warfarin.  Due to her severe anemia and her  anticoagulation she was offered transfusion but she declined to have it.  She was discharged home to have her staples removed in 4 days in the  maternity admissions unit.  She was given prescriptions for iron 325 mg one  p.o. b.i.d., prenatal vitamin one p.o. once daily, Percocet or Tylenol for  pain.  She was not given ibuprofen.  She was discharged on Coumadin 1 mg  p.o. once daily and her followup anticoagulation was to be per  J. __________, pharmacist at [facility].  She was to report to him for an  appointment Monday, April 09, 2003, and she was also as well as having  her staple removal to report to [facility] for her 6-week postpartum  check.     Deirdre Christy Gentles, C.N.M.                       Lesly Dukes, M.D.   DP/MEDQ  D:  05/14/2003  T:  05/15/2003  Job:  [id number]

## 2010-11-28 NOTE — Group Therapy Note (Signed)
NAME:  Taylor Parker, HADLOCK NO.:  [id number]   MEDICAL RECORD NO.:  [id number]          PATIENT TYPE:  WOC   LOCATION:  [facility]                   FACILITY[facility]   PHYSICIAN:  Tinnie Gens, MD        DATE OF BIRTH:  Mar 16, 1966   DATE OF SERVICE:  03/19/2005                                    CLINIC NOTE   CHIEF COMPLAINT:  Hospital follow-up.   The patient is a 45 year old gravida 3 para 3 who was recently hospitalized  with an abnormal INR and bleeding into her left ovary. At that time it was  thought she maybe had acute appendicitis which she did not have. Her INR was  corrected. It was found that she has been on Coumadin since her last  delivery in September 2004 when she underwent a C-section and tubal  ligation. It is not clear why the patient was on Coumadin for that long. She  is followed up at the outpatient clinic. Her Coumadin has been stopped. Her  pain has subsided on her left ovary and she is feeling much better today.   EXAMINATION:  VITAL SIGNS:  As noted in the chart.  PELVIC:  She has normal external female genitalia. The vagina is pink and  rugated. The cervix is without lesion. The uterus is anteverted and small.  Adnexa:  There is some tenderness on the patient's left side with deep  palpation but otherwise feels relatively normal. There is some fullness on  that side as well.   IMPRESSION:  Hemorrhagic ovarian cyst, probably resolving.   PLAN:  1.  Repeat ultrasound in 4-6 weeks to be sure that has completely resolved      and follow up at the outpatient clinic for her Coumadin and question of      protein S deficiency.  2.  The patient also should discuss her recurrent anxiety and constant worry      with her physician over there. She does report decreased sex drive which      she thinks is related to this.           ______________________________  Tinnie Gens, MD     TP/MEDQ  D:  03/19/2005  T:  03/20/2005  Job:  [id number]   cc:    Outpatient Clinic

## 2010-11-28 NOTE — Group Therapy Note (Signed)
NAME:  Taylor Parker, HAYMAKER NO.:  [id number]   MEDICAL RECORD NO.:  [id number]                   PATIENT TYPE:  OUT   LOCATION:  [facility]                           [facility]  [facility]   PHYSICIAN:  Tinnie Gens, MD                     DATE OF BIRTH:  January 06, 1966   DATE OF SERVICE:  05/24/2003                                    CLINIC NOTE   CHIEF COMPLAINT:  Postpartum check.   HISTORY OF PRESENT ILLNESS:  The patient is a 45 year old gravida 3, para 3-  0-0-3 who is six weeks postpartum from a repeat cesarean section of a baby  that was 9 pounds 12 ounces.  Apparently, she was ________  because she had  a history of DVT and was on heparin intravenously and she is now on  Coumadin.  She reports that she is feeling well.  She has no signs or  symptoms of depression.  She continues to breast-feed.   PHYSICAL EXAMINATION:  VITAL SIGNS:  Blood pressure 95/45, pulse 66, weight  137.8.  GENERAL:  She is a well-developed, well-nourished Hispanic female in no  acute distress.  ABDOMEN:  Soft, nontender, nondistended.  GENITOURINARY:  She has normal external female genitalia.  The vagina is  atrophic.  The uterus is small and involuted.   IMPRESSION:  1. Postpartum check, doing well.  2. History of deep venous thrombosis on Coumadin prophylaxis.   PLAN:  Pap smear obtained today.  Advised about atrophic vaginitis and  treatment for that.  She will follow up as needed.                                               Tinnie Gens, MD    TP/MEDQ  D:  06/14/2003  T:  06/15/2003  Job:  [id number]

## 2010-11-28 NOTE — Discharge Summary (Signed)
NAME:  Taylor Parker, Taylor Parker                 ACCOUNT NO.:  [id number]   MEDICAL RECORD NO.:  [id number]                   PATIENT TYPE:  INP   LOCATION:  5030                                 FACILITY:   PHYSICIAN:  Deirdre Peer. Polite, M.D.              DATE OF BIRTH:  December 28, 1965   DATE OF ADMISSION:  10/25/2002  DATE OF DISCHARGE:  10/27/2002                                 DISCHARGE SUMMARY   DISCHARGE MEDICATIONS:  1. Heparin subcu 500 units/kg/day divided in three doses.  2. Prenatal vitamin one daily.   ALLERGIES:  No known drug allergies.   PROCEDURE:  None.   HISTORY OF PRESENT ILLNESS:  A non-English speaking woman is four months  pregnant presents to the ER with increasing calf pain for the past month.  The patient is able to ambulate, but has increased pain with movement.  She  noticed a prominent vein in her left leg recently and denies chest pain or  shortness of breath.  Doppler ultrasound of her lower extremities revealed  nonocclusive popliteal DVT in the left leg.  Right leg is within normal  limits.  The patient is admitted for treatment of  nonocclusive DVT.   HOSPITAL COURSE:  The patient is admitted to medical bed and placed on  Lovenox for treatment of DVT.  On day of discharge, the patient is free of  leg pain, chest pain, shortness of breath or any other complaints.  Her  vital signs include temperature of 98.5, blood pressure 92/52, pulse 64,  respirations 20.  Room air saturations are 95%.   Because the patient is indigent and is not a legal resident of the [place], high risk OB clinic was contacted regarding the possibility of  obtaining medications for continued treatment of the patient's vascular  condition.  Case was discussed at length with Dr. Gavin Potters by both myself and  Dr. Nehemiah Settle and the best course of action felt to be to place the patient on  subcutaneous heparin three times a day at the dosage noted above and she  will follow up with  Dr. Gavin Potters in the high risk OB clinic on Wednesday 4/21  at 9:45 a.m.  Other health care needs should be addressed at [facility]  once she resumes nonpregnant status.  The patient was provided with teaching  regarding administration of her heparin as well as having a substantial  amount of her heparin prescription  filled through the indigent fund at  [facility] via the care management team.   DISCHARGE LABS:  Sodium 136, potassium 3.4, glucose 90, BUN 13, creatinine  0.6, white blood cell count 5.6, hemoglobin 11.1, hematocrit 32.2, platelet  count 191.  Initial coagulations were PT 12.9, INR 0.9, PTT 28.   CONSULTATIONS:  Dr. Gavin Potters of high risk OB clinic.   CONDITION ON DISCHARGE:  Good.    DISPOSITION:  Discharge to home.   FOLLOW UP:  Dr. Gavin Potters in high risk OB  clinic on 11/01/02 at 9:45 a.m. and  [facility] thereafter.      Ellender Hose. Virl Son. Polite, M.D.    SMD/MEDQ  D:  10/27/2002  T:  10/27/2002  Job:  [id number]   cc:   Conni Elliot, M.D.  [address]  [place]  [place] [address]  Fax: [phone]

## 2011-02-09 ENCOUNTER — Encounter: Payer: Self-pay | Admitting: Anesthesiology

## 2011-02-09 ENCOUNTER — Encounter: Payer: Self-pay | Admitting: *Deleted

## 2011-02-09 ENCOUNTER — Other Ambulatory Visit: Payer: Self-pay | Admitting: Gynecology

## 2011-02-09 ENCOUNTER — Telehealth: Payer: Self-pay | Admitting: Anesthesiology

## 2011-02-09 ENCOUNTER — Encounter: Payer: Self-pay | Admitting: Gynecology

## 2011-02-09 ENCOUNTER — Ambulatory Visit (INDEPENDENT_AMBULATORY_CARE_PROVIDER_SITE_OTHER): Payer: BC Managed Care – PPO | Admitting: Gynecology

## 2011-02-09 ENCOUNTER — Telehealth: Payer: Self-pay | Admitting: *Deleted

## 2011-02-09 DIAGNOSIS — N63 Unspecified lump in unspecified breast: Secondary | ICD-10-CM

## 2011-02-09 DIAGNOSIS — N61 Mastitis without abscess: Secondary | ICD-10-CM | POA: Insufficient documentation

## 2011-02-09 MED ORDER — DICLOXACILLIN SODIUM 250 MG PO CAPS: 500.0000 mg | ORAL_CAPSULE | Freq: Four times a day (QID) | ORAL | Status: AC

## 2011-02-09 MED ORDER — OXYCODONE-ACETAMINOPHEN 5-500 MG PO CAPS: 1.0000 | ORAL_CAPSULE | ORAL | Status: AC | PRN

## 2011-02-09 NOTE — Progress Notes (Signed)
Dr. Lily Peer asked [name] to schedule an appointment for a diagnostic mammogram for Taylor Parker garcia.. And I helped [name] with telling the patient all the information regarding her appointment since the patient is spanish speaking only..I Informed the patient while she was here in the office that her mammogram appt.  will be at the [facility] on august 8th at 8:10am.., and to arrive 15 minutes early.Marland Kitchen

## 2011-02-09 NOTE — Telephone Encounter (Signed)
APPOINTMENT MADE FOR 02/18/11 AT THE BREAST CENTER FOR THE BELOW NOTE. WILL HAVE BLANCA CALL PT TO INFORM OF APPOINTMENT TIME

## 2011-02-09 NOTE — Progress Notes (Signed)
45 year old patient presented to the office today complaining of right breast pain for the past 2 days and worsening last p.m. she had some fever or chills and body aches. She had very minimal nipple discharge and she stated that her temperature was elevated at home although she did not take her temperature with a thermometer. In the office her temperature was 101.2 her blood pressure is 102/68 she had a normal mammogram in February 2011. She denies any breast trauma or injury to her breast recently. Breast exam: Both breasts were symmetrical in appearance there was no skin discoloration of nipple inversion no skin retraction Left breast with no palpable masses or tenderness no supraclavicular or axillary lymphadenopathy. Right breast tender area at the 9 to 10:00 periareolar region with a 1-1/2-2 cm that was exquisitely tender. Assessment: Right breast mastitis possible breast abscess cannot rule out malignancy. All the patient's temperature elevation would make one highly suspicious of an infectious process. She'll be placed on dicloxacillin 500 mg 4 times a day for 10 days. She was also given a prescription for Tylox to take one to 2 tablets every 4-6 hours when necessary pain and to apply a moist warm compresses to the breast. We'll schedule an ultrasound of the right breast and possible diagnostic mammogram within the next 48 hours. Patient with her report to me in 10 days for followup reexamination or sooner depending on the imaging study results. If she does not resolve with antibiotic treatment and there is no evidence of any suspicious malignancy we'll need to consider referral to a general surgeon for an incision and drainage of abrupt possible breast mass. All this was explained to the patient in Spanish in detail and we'll follow accordingly. Of note patient had history of hypercholesterolemia had been placed on Lipitor 10 mg daily but only took it for one month because of a muscle aches. Once this  episode of her breast is resolved we'll repeat her fasting lipid profile and manage accordingly referred to internist.

## 2011-02-09 NOTE — Patient Instructions (Addendum)
Tomat el antibiotico y medicamento para Chief Technology Officer. Adine Madura a llamar con las instrucciones y cita con radiologo hoy o Careers information officer. Haga cita para ver al Dr. Lily Peer en 10 dias

## 2011-02-09 NOTE — Telephone Encounter (Signed)
Message copied by Aura Camps on Mon Feb 09, 2011 12:56 PM ------      Message from: Ok Edwards      Created: Mon Feb 09, 2011 12:31 PM       Taylor Parker please schedule a diagnostic ultrasound of the right breast possible mammogram. Patient with right breast mass at the 9:00 position the periurethral region tender and with fever. She was started on antibiotics today and pain medication. The patient speaks very little [place] and we'll need [place] or [place] to translate.

## 2011-02-09 NOTE — Telephone Encounter (Signed)
Message copied by Toma Aran on Mon Feb 09, 2011  2:12 PM ------      Message from: Aura Camps      Created: Mon Feb 09, 2011 12:48 PM       PLEASE CALL PT AND TELL HER APPOINTMENT TIME & DATE AT THE [facility]AST [facility] FOR THE BELOW IS [facility] 02/22/11 8:00A.M. THANKS.      ----- Message -----         From: Ok Edwards, MD         Sent: 02/09/2011  12:31 PM           To: Eusebio Friendly please schedule a diagnostic ultrasound of the right breast possible mammogram. Patient with right breast mass at the 9:00 position the periurethral region tender and with fever. She was started on antibiotics today and pain medication. The patient speaks very little [place] and we'll need [place] or [place] to translate.

## 2011-02-11 HISTORY — PX: BREAST SURGERY: SHX581

## 2011-02-12 ENCOUNTER — Ambulatory Visit (INDEPENDENT_AMBULATORY_CARE_PROVIDER_SITE_OTHER): Payer: BC Managed Care – PPO | Admitting: Gynecology

## 2011-02-12 VITALS — BP 106/68

## 2011-02-12 DIAGNOSIS — N611 Abscess of the breast and nipple: Secondary | ICD-10-CM

## 2011-02-12 DIAGNOSIS — N61 Mastitis without abscess: Secondary | ICD-10-CM

## 2011-02-12 NOTE — Progress Notes (Signed)
Patient presented to the office for followup as previously instructed. She was seen in the office approximate 4 days ago for suspected right mastitis. She was started on dicloxacillin 500 mg 4 times a day for a 10 day course. She is scheduled for ultrasound for/diagnostic mammogram for next week. Patient returned to the office today with erythematous area from the 9 to 11:00 position although less tender than previous exam a few days ago and she is not report any fever at home. I would refer to the marginal surgery colleagues for consideration of incision and drainage of suspected to be a right breast abscess. There was no supraclavicular or axillary lymphadenopathy on the right breast in the contralateral breast was otherwise unremarkable. She had a slight right nipple discharge which was wiped upon compressing the breast more than likely it's physiological.

## 2011-02-13 ENCOUNTER — Encounter (INDEPENDENT_AMBULATORY_CARE_PROVIDER_SITE_OTHER): Payer: Self-pay | Admitting: General Surgery

## 2011-02-13 ENCOUNTER — Ambulatory Visit (INDEPENDENT_AMBULATORY_CARE_PROVIDER_SITE_OTHER): Payer: BC Managed Care – PPO | Admitting: General Surgery

## 2011-02-13 VITALS — BP 102/68 | HR 80 | Temp 97.4°F

## 2011-02-13 DIAGNOSIS — N63 Unspecified lump in unspecified breast: Secondary | ICD-10-CM | POA: Insufficient documentation

## 2011-02-13 MED ORDER — DOXYCYCLINE HYCLATE 100 MG PO TABS: 100.0000 mg | ORAL_TABLET | Freq: Two times a day (BID) | ORAL | Status: AC

## 2011-02-13 NOTE — Patient Instructions (Signed)
Needs R breast u/s and possible bx or aspiration Continue doxycycline F/U in 1 week

## 2011-02-16 ENCOUNTER — Other Ambulatory Visit: Payer: Self-pay | Admitting: Gynecology

## 2011-02-16 ENCOUNTER — Other Ambulatory Visit: Payer: Self-pay | Admitting: *Deleted

## 2011-02-16 ENCOUNTER — Ambulatory Visit
Admission: RE | Admit: 2011-02-16 | Discharge: 2011-02-16 | Disposition: A | Discharge status: Home / Self Care | Payer: BC Managed Care – PPO | Source: Ambulatory Visit | Attending: Gynecology | Admitting: Gynecology

## 2011-02-16 ENCOUNTER — Other Ambulatory Visit (INDEPENDENT_AMBULATORY_CARE_PROVIDER_SITE_OTHER): Payer: Self-pay | Admitting: General Surgery

## 2011-02-16 DIAGNOSIS — N63 Unspecified lump in unspecified breast: Secondary | ICD-10-CM

## 2011-02-17 NOTE — Progress Notes (Signed)
Subjective:     Patient ID: Taylor Parker, female   DOB: 08-02-1965, 45 y.o.   MRN: [id number]  HPI We are asked to see the patient in consultation by Dr. Lily Peer to evaluate her for a right breast abscess. The patient is [name] who has been having pain in the right breast for about the last 4 months. Pain has been worse over the last 8 days. She's not had any drainage from the area. She denies any fevers or chills at home. She was started on dicloxacillin on Monday. She has not improved since starting antibiotics.  Review of Systems  Constitutional: Negative.   HENT: Negative.   Eyes: Negative.   Respiratory: Negative.   Cardiovascular: Negative.   Gastrointestinal: Negative.   Genitourinary: Negative.   Musculoskeletal: Negative.   Neurological: Negative.   Hematological: Negative.   Psychiatric/Behavioral: Negative.        Objective:   Physical Exam  Constitutional: She is oriented to person, place, and time. She appears well-developed and well-nourished.  HENT:  Head: Normocephalic and atraumatic.  Eyes: Conjunctivae and EOM are normal. Pupils are equal, round, and reactive to light.  Neck: Normal range of motion. Neck supple.  Cardiovascular: Normal rate, regular rhythm and normal heart sounds.   Pulmonary/Chest: Effort normal and breath sounds normal.       The patient has a large mass centrally and laterally in the right breast. It is quite tender. There is actually minimal redness to the skin of the breast. No palpable right axillary adenopathy.  Abdominal: Soft. Bowel sounds are normal.  Musculoskeletal: Normal range of motion.  Neurological: She is alert and oriented to person, place, and time.  Skin: Skin is warm and dry.  Psychiatric: She has a normal mood and affect. Her behavior is normal.       Assessment:     Large painful right breast mass. Although this could be an abscess I would've expected more redness to the breast.  Because of this we will plan to have the breast ultrasounded and possibly biopsied or aspirated by the breast center. A written her a new prescription for doxycycline that I would like her to take.    Plan:     She will continue antibiotics and we will plan to see her back in about a week and review the results of the ultrasound of her breast.

## 2011-02-18 ENCOUNTER — Other Ambulatory Visit: Payer: BC Managed Care – PPO

## 2011-02-19 ENCOUNTER — Ambulatory Visit: Payer: BC Managed Care – PPO | Admitting: Gynecology

## 2011-02-20 ENCOUNTER — Other Ambulatory Visit: Payer: Self-pay | Admitting: Gynecology

## 2011-02-20 ENCOUNTER — Ambulatory Visit
Admission: RE | Admit: 2011-02-20 | Discharge: 2011-02-20 | Disposition: A | Discharge status: Home / Self Care | Payer: BC Managed Care – PPO | Source: Ambulatory Visit | Attending: Gynecology | Admitting: Gynecology

## 2011-02-20 DIAGNOSIS — N63 Unspecified lump in unspecified breast: Secondary | ICD-10-CM

## 2011-02-23 ENCOUNTER — Other Ambulatory Visit: Payer: Self-pay | Admitting: Gynecology

## 2011-02-23 ENCOUNTER — Other Ambulatory Visit: Payer: BC Managed Care – PPO

## 2011-02-23 ENCOUNTER — Ambulatory Visit
Admission: RE | Admit: 2011-02-23 | Discharge: 2011-02-23 | Disposition: A | Discharge status: Home / Self Care | Payer: BC Managed Care – PPO | Source: Ambulatory Visit | Attending: Gynecology | Admitting: Gynecology

## 2011-02-23 DIAGNOSIS — N611 Abscess of the breast and nipple: Secondary | ICD-10-CM

## 2011-02-23 DIAGNOSIS — N63 Unspecified lump in unspecified breast: Secondary | ICD-10-CM

## 2011-03-04 ENCOUNTER — Encounter (INDEPENDENT_AMBULATORY_CARE_PROVIDER_SITE_OTHER): Payer: Self-pay | Admitting: General Surgery

## 2011-03-04 ENCOUNTER — Ambulatory Visit (INDEPENDENT_AMBULATORY_CARE_PROVIDER_SITE_OTHER): Payer: BC Managed Care – PPO | Admitting: General Surgery

## 2011-03-04 ENCOUNTER — Encounter: Payer: Self-pay | Admitting: Gynecology

## 2011-03-04 ENCOUNTER — Ambulatory Visit (INDEPENDENT_AMBULATORY_CARE_PROVIDER_SITE_OTHER): Payer: BC Managed Care – PPO | Admitting: Gynecology

## 2011-03-04 VITALS — BP 92/64 | HR 68 | Temp 97.1°F | Ht 61.0 in | Wt 155.6 lb

## 2011-03-04 VITALS — BP 110/70

## 2011-03-04 DIAGNOSIS — N61 Mastitis without abscess: Secondary | ICD-10-CM

## 2011-03-04 DIAGNOSIS — N611 Abscess of the breast and nipple: Secondary | ICD-10-CM

## 2011-03-04 NOTE — Progress Notes (Signed)
The patient is a 45 year old who was seen in the office on August 6 for a painful right breast a suspected breast abscess was the working diagnosis she was placed on dicloxacillin 500 mg twice a day for 10 day course she was sent for diagnostic mammogram no discernible mass or abnormal calcifications were noted an ultrasound demonstrated a hypoechoic area 1.8 x 2.4 x 3.0 cm and was recommended to have a core biopsy the mass was located at the 9:00 position of the right breast approximately 3 cm in size. She was referred to the general surgeon Dr.Toth who placed her on doxycycline 100 mg twice a day for 2 weeks. She returns to the office today stating that her breasts still tender and red and warm to and her nipple is now inverted and redness on the skin noted. The pathology report from the ultrasound guided core biopsy demonstrated acute and chronic abscess formation. They had recommended a followup ultrasound September 3.   breast exam: Both breasts were examined in sitting and supine position Left breast: No palpable masses or tenderness no nipple inversion no supraclavicular or axillary lymphadenopathy Right breast: Erythematous area noted at the 9:00 position. A 3-1/2 cm tender mass noted at the 9:00 position periareolar region extending laterally. Confirm non-mobile. No supraclavicular or axillary lymphadenopathy slight nipple discharge was noted.  Assessment: Right breast abscess unresponsive to antibiotic we'll referred to my colleague Dr. Carolynne Edouard general surgeon who had seen patient before for consideration of incision and drainage. Patient was scheduled to see him this afternoon. Instructions were provided to the patient Spanish and she'll follow accordingly.

## 2011-03-04 NOTE — Progress Notes (Signed)
Chief Complaint  Patient presents with  . Other    Recurrent breast abscess    HPI Taylor Parker is a 45 y.o. female.   HPI This is a 45 year old female who first was seen on 6 August with a right breast abscess. She was treated with dicloxacillin. She underwent a mammogram and ultrasound which I reviewed with Dr. Jean Rosenthal at this point. Her mammogram shows dense tissue in her ultrasound shows a 1.8 x 2.4 x 3 cm area that appears to be a chronic abscess cavity. This was biopsied and showed acute and chronic abscess formation. She then was seen by Dr. Carolynne Edouard who put her on doxycycline. She really has no improvement over the last month essentially. She was seen again by Dr. Lily Peer and referred back for really nonimprovement of this area. She reports tenderness, warmth, and nipple discharge. She reports no fevers right now although she had some at the beginning.Past Medical History  Diagnosis Date  . Depression   . PMDD (premenstrual dysphoric disorder)   . Hyperlipidemia   . Breast pain     right  . Breast infection     right    Past Surgical History  Procedure Date  . Cesarean section     X 3  . Tubal ligation 2004    No family history on file.  Social History History  Substance Use Topics  . Smoking status: Never Smoker   . Smokeless tobacco: Never Used  . Alcohol Use: No    No Known Allergies  No current outpatient prescriptions on file.    Review of Systems Review of Systems  Constitutional: Positive for fever.  HENT: Negative.   Eyes: Negative.   Respiratory: Negative.   Cardiovascular: Negative.   Gastrointestinal: Negative.   Genitourinary: Negative.   Musculoskeletal: Negative.   Skin: Negative.   Neurological: Negative.   Endo/Heme/Allergies: Negative.   Psychiatric/Behavioral: Negative.     Blood pressure 92/64, pulse 68, temperature 97.1 F (36.2 C), temperature source Temporal, height 5\' 1"  (1.549 m), weight 155 lb 9.6 oz (70.58 kg), last  menstrual period 01/22/2011.  Physical Exam Physical Exam  Constitutional: She appears well-developed and well-nourished.  Neck: Neck supple.  Cardiovascular: Normal rate, regular rhythm and normal heart sounds.   Respiratory: Effort normal and breath sounds normal. She has no wheezes. She has no rales. Right breast exhibits mass, nipple discharge, skin change (erythema) and tenderness. Right breast exhibits no inverted nipple. Left breast exhibits no inverted nipple, no mass, no nipple discharge, no skin change and no tenderness. Breasts are symmetrical.    Lymphadenopathy:    She has no cervical adenopathy.       Assessment    Right breast abscess    Plan    I think this is a chronic right breast abscess. I've reviewed the case with Dr. Jean Rosenthal in radiology. I think she is a failure of medical therapy at this point I don't think there is an area to aspirate would drain at this point either. I think she needs an incision and drainage in the operating room. We discussed that as well as the fact that she'll have an open wound. There is still a small chance there could be a cancer associated with this but I think clinically and radiologically this appears to be an abscess that is not responsive to treatment. I would try to do this tomorrow.       [name] 03/04/2011, 4:04 PM

## 2011-03-05 ENCOUNTER — Other Ambulatory Visit (INDEPENDENT_AMBULATORY_CARE_PROVIDER_SITE_OTHER): Payer: Self-pay | Admitting: General Surgery

## 2011-03-05 ENCOUNTER — Ambulatory Visit (HOSPITAL_BASED_OUTPATIENT_CLINIC_OR_DEPARTMENT_OTHER)
Admission: RE | Admit: 2011-03-05 | Discharge: 2011-03-05 | Disposition: A | Discharge status: Home / Self Care | Payer: BC Managed Care – PPO | Source: Ambulatory Visit | Attending: General Surgery | Admitting: General Surgery

## 2011-03-05 DIAGNOSIS — N61 Mastitis without abscess: Secondary | ICD-10-CM

## 2011-03-05 DIAGNOSIS — Z01812 Encounter for preprocedural laboratory examination: Secondary | ICD-10-CM | POA: Insufficient documentation

## 2011-03-05 HISTORY — PX: INCISE AND DRAIN ABCESS: PRO64

## 2011-03-05 LAB — POCT HEMOGLOBIN-HEMACUE: Hemoglobin: 12.7 g/dL (ref 12.0–15.0)

## 2011-03-08 LAB — CULTURE, ROUTINE-ABSCESS: Culture: NO GROWTH

## 2011-03-09 ENCOUNTER — Encounter (INDEPENDENT_AMBULATORY_CARE_PROVIDER_SITE_OTHER): Payer: BC Managed Care – PPO

## 2011-03-09 NOTE — Op Note (Signed)
NAME:  MACKENZEE, BECVAR NO.:  [id number]  MEDICAL RECORD NO.:  [id number]  LOCATION:                                 FACILITY:  PHYSICIAN:  Juanetta Gosling, MDDATE OF BIRTH:  1965-12-01  DATE OF PROCEDURE:  03/05/2011 DATE OF DISCHARGE:                              OPERATIVE REPORT   PREOPERATIVE DIAGNOSIS:  Chronic right breast abscess.  POSTOPERATIVE DIAGNOSIS:  Chronic right breast abscess.  PROCEDURE: 1. Right breast chronic abscess, incision and drainage. 2. Incisional biopsy, right breast.  SURGEON:  Juanetta Gosling, MD  ASSISTANT:  None.  ANESTHESIA:  General.  SPECIMEN: 1. Cultures to microbiology. 2. Right breast tissue to pathology.  ESTIMATED BLOOD LOSS:  Minimal.  COMPLICATIONS:  None.  DRAINS:  None.  DISPOSITION:  To recovery room in stable condition.  INDICATIONS:  This is a 45 year old female first seen on August 6 with a right breast abscess.  She had been treated with dicloxacillin as well as doxycycline.  She had been seen by Dr. Carolynne Edouard in our practice and was maintained on antibiotics.  She underwent a mammogram ultrasound that showed a 1.8 x 2.4 x 3.3 cm that appeared to be a chronic cavity.  I reviewed this with Dr. Anselmo Pickler.  She had a biopsy showed acute and chronic abscess formation.  She was seen by Dr. Lily Peer 2 days ago now with no really no improvement in this area.  I saw her in Urgent office and scheduled her for the following day for an incision and drainage as this does appear to be a chronic abscess both clinically and radiologically that is not resolving.  PROCEDURE:  After informed consent was obtained, the patient was taken to the operating room.  She was administered 1 gram of intravenous cefazolin.  Sequential compression devices were placed on lower extremities prior to induction with anesthesia.  She was then placed under general anesthesia without complication.  Her right breast  was prepped and draped in standard sterile surgical fashion.  A surgical time-out was then performed.  I made a radial incision overlying the mass at the 9 o'clock position of her breast.  I carried this down to a very hard thickened area.  I entered this and this was a chronic multiloculated abscess cavity with purulent fluid in multiple different cavities.  I cleaned this out as much as to make sure that it was all evacuated.  I did take cultures of some of the purulence.  Hemostasis was obtained.  I did then irrigate copiously.  I removed 2 small portions of very thickened what appeared to be just cavity wall but I wanted to make sure that there is not a breast cancer associated with this, although it certainly does appear that it is a chronic abscess.  She even had purulent drainage from her nipple associated with this as well.  This was then irrigated.  I packed this Iodoform and placed a dressing overlying this.  She tolerated this well, was extubated in the operating room, transferred to recovery room in stable condition.  I am going to have her leave this dressing in place and have her come back on Monday for change.     Juanetta Gosling, MD  MCW/MEDQ  D:  03/05/2011  T:  03/05/2011  Job:  [id number]  cc:   Gaetano Hawthorne. Lily Peer, M.D.  Electronically Signed by Emelia Loron MD on 03/09/2011 08:32:55 AM

## 2011-03-10 LAB — ANAEROBIC CULTURE

## 2011-03-12 ENCOUNTER — Ambulatory Visit (INDEPENDENT_AMBULATORY_CARE_PROVIDER_SITE_OTHER): Payer: BC Managed Care – PPO | Admitting: General Surgery

## 2011-03-12 DIAGNOSIS — Z09 Encounter for follow-up examination after completed treatment for conditions other than malignant neoplasm: Secondary | ICD-10-CM

## 2011-03-12 NOTE — Progress Notes (Signed)
Subjective:     Patient ID: Taylor Parker, female   DOB: [birth year], 45 y.o.   MRN: [id number]  HPI This is a 45 year old female who I saw recently for a chronic right breast abscess. I took her to the operating room recently and drained this as well as did an incisional biopsy. This is chronic abscess there is no cancer involved with this. She presents today for another dressing change. She's doing well without any complaints. She denies any fevers.  Review of Systems     Objective:   Physical Exam Open right breast incision with some serous drainage, no erythema    Assessment:     Chronic right breast abscess s/p drainage    Plan:        This is likely going to take several weeks to heal. She does not need any more antibiotics when she completes the current course. She is however going to need dressing changes 2-3 times a week for the foreseeable future.

## 2011-03-17 ENCOUNTER — Ambulatory Visit (INDEPENDENT_AMBULATORY_CARE_PROVIDER_SITE_OTHER): Payer: BC Managed Care – PPO

## 2011-03-17 ENCOUNTER — Other Ambulatory Visit: Payer: BC Managed Care – PPO

## 2011-03-17 DIAGNOSIS — Z5189 Encounter for other specified aftercare: Secondary | ICD-10-CM

## 2011-03-17 NOTE — Progress Notes (Signed)
Pt came in for dressing change to her rt br abscess. The packing was removed and repacked with 1/4 in. Iodoform packing. The area was redressed. The pt has questions about next appt,how long to keep packing it,when est. Time to return to work,and when can she get her vein surgery rescheduled. I advised pt that I would discuss all this with him at our next office tomorrow./ [facility]

## 2011-03-19 ENCOUNTER — Ambulatory Visit (INDEPENDENT_AMBULATORY_CARE_PROVIDER_SITE_OTHER): Payer: BC Managed Care – PPO

## 2011-03-19 DIAGNOSIS — Z5189 Encounter for other specified aftercare: Secondary | ICD-10-CM

## 2011-03-19 NOTE — Progress Notes (Signed)
Pt came in for wound breast check to have packing removed. The area looked great and packing removed. I repacked the breast wound and showed the pt how to repack area herself. I advised the pt to change the packing every other day per Dr Dwain Sarna. I made an appt for pt to be rechecked by Dr Dwain Sarna on 04-02-11. I advised the pt if she has any problems with packing the wound to call Elane Fritz b/c pt speaks spanish to get her back in for nurse only visits.Hulda Humphrey

## 2011-03-30 ENCOUNTER — Telehealth (INDEPENDENT_AMBULATORY_CARE_PROVIDER_SITE_OTHER): Payer: Self-pay

## 2011-03-30 NOTE — Telephone Encounter (Signed)
Olegario Messier calling from [facility] to see about if pt will need her repeat breast ultrasound that they recommend 10-14 days after they saw her last. The pt had a ultrasound scheduled for 03-17-11 but the pt canceled it b/c she told the [facility] that you said it was ok to cancel the ultrasound. The [facility] is just following up on this so they can chart a note in her chart if you are definitely ok with pt not repeating the ultrasound. Just let me know so I can call Olegario Messier at the [facility]./ [facility]

## 2011-03-30 NOTE — Telephone Encounter (Signed)
Yes can be cancelled for now.  She may need to at some point get redone but not now.

## 2011-03-30 NOTE — Telephone Encounter (Signed)
LMOM for Taylor Parker to notify her that Dr Dwain Sarna still agrees with the pt waiting on getting a repeat breast ultrasound for now. We will contact the [facility] when it is time for the ultrasound.Hulda Humphrey

## 2011-04-02 ENCOUNTER — Ambulatory Visit (INDEPENDENT_AMBULATORY_CARE_PROVIDER_SITE_OTHER): Payer: BC Managed Care – PPO | Admitting: General Surgery

## 2011-04-02 VITALS — BP 100/60 | HR 72 | Temp 98.6°F | Resp 16 | Ht 64.0 in | Wt 157.0 lb

## 2011-04-02 DIAGNOSIS — N61 Mastitis without abscess: Secondary | ICD-10-CM

## 2011-04-02 DIAGNOSIS — N611 Abscess of the breast and nipple: Secondary | ICD-10-CM

## 2011-04-02 NOTE — Progress Notes (Signed)
Subjective:     Patient ID: Taylor Parker, female   DOB: [birth year], 45 y.o.   MRN: [id number]  HPI This is a 45 year old female who I took to the operating room for a chronic right breast abscess. This has been healing now by secondary intention. Her pathology from an incisional biopsy showed this was an abscess. It is now nearly healed. She's been packing it but really can't get much packing it all anymore. The drainage is decreased to minimal amount. She has no fevers and is not tender.  Review of Systems     Objective:   Physical Exam Right breast with small opening with no real drainage, difficult to pack, no infection, scar tissue present    Assessment:     S/p right breast abscess    Plan:     Place band-aid over wound No need for packing anymore Will see back in 3 weeks May return to work and no restrictions

## 2011-04-10 ENCOUNTER — Telehealth (INDEPENDENT_AMBULATORY_CARE_PROVIDER_SITE_OTHER): Payer: Self-pay

## 2011-04-10 NOTE — Telephone Encounter (Signed)
Pt called and states she scratched the area that was operated on and it bled a little and some pus drained.  I asked her to make sure she is showering and keeping it clean and dry and cover with a clean gauze.  She will call us Monday and let us know if no better.

## 2011-04-13 ENCOUNTER — Ambulatory Visit (INDEPENDENT_AMBULATORY_CARE_PROVIDER_SITE_OTHER): Payer: BC Managed Care – PPO | Admitting: General Surgery

## 2011-04-13 VITALS — BP 110/72 | HR 70 | Temp 97.7°F | Resp 16

## 2011-04-13 DIAGNOSIS — Z09 Encounter for follow-up examination after completed treatment for conditions other than malignant neoplasm: Secondary | ICD-10-CM

## 2011-04-13 NOTE — Progress Notes (Signed)
Subjective:     Patient ID: Taylor Parker, female   DOB: [birth year], 45 y.o.   MRN: [id number]  HPI This is a 45 year old  female who had a very significant right breast abscess that was chronic in nature. We debrided this and I did a biopsy that was consistent with that as well. This is slowly healed and she's been doing very well until this weekend she was she began developing some pain as well as a pins and needles sensation and some swelling in the right breast. She denies any fevers but she did notice a little bit of drainage from what is only a partially open wound at this point. She wanted this evaluated today just to ensure there was no other infection present. Review of Systems     Objective:   Physical Exam Right breast incision nearly healed, it is still partially open, no erythema, hardened scar tissue present but I do not identify any infection today    Assessment:     S/p right breast abscess I and D    Plan:     I think most of this is just postoperative in nature I don't think she has a recurrence of her abscess. I however am going to send her for a repeat ultrasound of this area and will have her followup after that.

## 2011-04-14 ENCOUNTER — Telehealth (INDEPENDENT_AMBULATORY_CARE_PROVIDER_SITE_OTHER): Payer: Self-pay

## 2011-04-14 NOTE — Telephone Encounter (Signed)
Called Kara @ [facility] to see about them just doing a breast ultrasound instead of ordering another diagnostic mgm. Diannia Ruder said she could problaly just order the ultrasound since we were just rechecking the same area. Diannia Ruder will call the pt./[facility]HS

## 2011-04-15 ENCOUNTER — Ambulatory Visit
Admission: RE | Admit: 2011-04-15 | Discharge: 2011-04-15 | Disposition: A | Discharge status: Home / Self Care | Payer: BC Managed Care – PPO | Source: Ambulatory Visit | Attending: General Surgery | Admitting: General Surgery

## 2011-04-15 ENCOUNTER — Other Ambulatory Visit (INDEPENDENT_AMBULATORY_CARE_PROVIDER_SITE_OTHER): Payer: Self-pay | Admitting: General Surgery

## 2011-04-15 DIAGNOSIS — Z872 Personal history of diseases of the skin and subcutaneous tissue: Secondary | ICD-10-CM

## 2011-04-15 DIAGNOSIS — Z09 Encounter for follow-up examination after completed treatment for conditions other than malignant neoplasm: Secondary | ICD-10-CM

## 2011-04-15 DIAGNOSIS — N611 Abscess of the breast and nipple: Secondary | ICD-10-CM

## 2011-04-15 MED ORDER — DOXYCYCLINE HYCLATE 100 MG PO TABS: 100.0000 mg | ORAL_TABLET | Freq: Two times a day (BID) | ORAL | Status: AC

## 2011-04-21 ENCOUNTER — Encounter (INDEPENDENT_AMBULATORY_CARE_PROVIDER_SITE_OTHER): Payer: Self-pay | Admitting: General Surgery

## 2011-04-21 ENCOUNTER — Ambulatory Visit (INDEPENDENT_AMBULATORY_CARE_PROVIDER_SITE_OTHER): Payer: BC Managed Care – PPO | Admitting: General Surgery

## 2011-04-21 VITALS — BP 108/78 | HR 72 | Temp 97.8°F | Resp 20 | Ht 63.0 in | Wt 153.1 lb

## 2011-04-21 DIAGNOSIS — Z09 Encounter for follow-up examination after completed treatment for conditions other than malignant neoplasm: Secondary | ICD-10-CM

## 2011-04-21 NOTE — Progress Notes (Signed)
Subjective:     Patient ID: Taylor Parker, female   DOB: 05/31/1966, 45 y.o.   MRN: [id number]  HPI This is a 45 year old female who had a very significant right breast abscess that was chronic in nature. We debrided this and I did a biopsy that was consistent with that as well. This is slowly healed and she's been doing very well.  She has repeat u/s without any further abscess. She is going to complete her antibiotics and come see me in six weeks as she is without any problems now  Review of Systems     Objective:   Physical Exam Right breast incision nearly healed with small amount granulation tissue    Assessment:     Right breast abscess s/p i and d    Plan:     Return in 6 weeks.  I think this is nearly healed now.

## 2011-05-21 ENCOUNTER — Ambulatory Visit (INDEPENDENT_AMBULATORY_CARE_PROVIDER_SITE_OTHER): Payer: BC Managed Care – PPO | Admitting: General Surgery

## 2011-05-21 ENCOUNTER — Encounter (INDEPENDENT_AMBULATORY_CARE_PROVIDER_SITE_OTHER): Payer: Self-pay | Admitting: General Surgery

## 2011-05-21 VITALS — BP 112/74 | HR 60 | Temp 98.1°F | Resp 12 | Ht 62.0 in | Wt 148.0 lb

## 2011-05-21 DIAGNOSIS — N61 Mastitis without abscess: Secondary | ICD-10-CM

## 2011-05-21 DIAGNOSIS — N611 Abscess of the breast and nipple: Secondary | ICD-10-CM

## 2011-05-21 MED ORDER — DOXYCYCLINE HYCLATE 100 MG PO TABS: 100.0000 mg | ORAL_TABLET | Freq: Two times a day (BID) | ORAL | Status: DC

## 2011-05-21 NOTE — Progress Notes (Signed)
Subjective:     Patient ID: Taylor Parker, female   DOB: January 05, 1966, 45 y.o.   MRN: [id number]  HPI Patient is a history of right breast abscess on the lateral aspect of her area n the past. This was incised and drained. Recently she developed a small red area at the medial aspect of her right breast area left. She's had no drainage from the skin she does not localize pain Review of Systems     Objective:   Physical Exam Right breast reveals a well-healed incision and drainage site on the lateral aspect of the areola. The medial aspect however there is a 1 cm indurated erythematous region consistent with a tiny localized abscess. There is no drainage. Procedure note: The area was prepped in the sterile fashion it was anesthetized with 1% lidocaine. A small radial incision was made. A small material material was obtained there was no large amount of pus there was a small pocket inside that was evacuated a dressing was applied. Patient tolerated this well.    Assessment:     Small right breast abscess    Plan:     Placement doxycycline. This was incised and drained as above. Wound care instructions were given and discussed in Spanish. Check her back in the office.

## 2011-05-21 NOTE — Patient Instructions (Signed)
The antibiotic is at your pharmacy. Keep area covered until it closes

## 2011-05-26 ENCOUNTER — Ambulatory Visit (INDEPENDENT_AMBULATORY_CARE_PROVIDER_SITE_OTHER): Payer: BC Managed Care – PPO | Admitting: General Surgery

## 2011-05-26 ENCOUNTER — Encounter (INDEPENDENT_AMBULATORY_CARE_PROVIDER_SITE_OTHER): Payer: Self-pay | Admitting: General Surgery

## 2011-05-26 VITALS — BP 112/70 | HR 56 | Temp 98.4°F | Resp 12 | Ht 63.0 in | Wt 149.2 lb

## 2011-05-26 DIAGNOSIS — N61 Mastitis without abscess: Secondary | ICD-10-CM

## 2011-05-26 DIAGNOSIS — N611 Abscess of the breast and nipple: Secondary | ICD-10-CM

## 2011-05-26 MED ORDER — DOXYCYCLINE HYCLATE 100 MG PO TABS: 100.0000 mg | ORAL_TABLET | Freq: Two times a day (BID) | ORAL | Status: AC

## 2011-05-26 NOTE — Progress Notes (Signed)
Subjective:     Patient ID: Taylor Parker, female   DOB: 01-19-1966, 45 y.o.   MRN: [id number]  HPI  This is a 45 year old female who I know well after having a very large right breast abscess. This area been attempted to be treated medically for I saw her. This clearly had not been successful and I took her to the operating room shortly thereafter. She had a very large cavity of chronic infection. I did biopsy a couple of areas that have all been consistent with an abscess. This area to heal by secondary intention. Since then she has been doing fairly well although she was seen last week by one of my partners and had a small area on the other side of her areola that was drained and a small amount of fluid was removed from there. There was a small pocket but no real pus was identified. She has a followup ultrasound does not show real any other abnormalities at all. She comes back in today on doxycycline with continued complaints of some redness around her nipple. I have biopsied as before and do not think currently that this is a tumor that is the source of this. Review of Systems     Objective:   Physical Exam  Pulmonary/Chest: Right breast exhibits no inverted nipple, no mass, no nipple discharge and no tenderness.         Assessment:     Chronic right breast infection s/p drainage    Plan:     I don't think this is related to her cancer. If this erythema persists I will talk to her about doing a punch biopsy of her skin. I think done everything I can at this point to rule out a cancer. I would plan on treating her with doxycycline for another couple weeks and then following up. If this gets worse she will come back sooner. I do think that this is cellulitis and hopefully should be amenable to antibiotics.

## 2011-06-02 ENCOUNTER — Other Ambulatory Visit (INDEPENDENT_AMBULATORY_CARE_PROVIDER_SITE_OTHER): Payer: Self-pay | Admitting: Surgery

## 2011-06-02 ENCOUNTER — Ambulatory Visit (INDEPENDENT_AMBULATORY_CARE_PROVIDER_SITE_OTHER): Payer: BC Managed Care – PPO | Admitting: Surgery

## 2011-06-02 ENCOUNTER — Encounter (INDEPENDENT_AMBULATORY_CARE_PROVIDER_SITE_OTHER): Payer: Self-pay | Admitting: Surgery

## 2011-06-02 VITALS — BP 102/74 | HR 60 | Temp 98.4°F | Resp 16 | Ht 62.0 in | Wt 148.1 lb

## 2011-06-02 DIAGNOSIS — N61 Mastitis without abscess: Secondary | ICD-10-CM

## 2011-06-02 DIAGNOSIS — N611 Abscess of the breast and nipple: Secondary | ICD-10-CM

## 2011-06-02 NOTE — Progress Notes (Signed)
[facility]  Ovidio Kin, MD,  FACS [address]  [address] [place], [place]    [address] Phone:  [phone] FAX:  [phone]   Re:   Taylor Parker DOB:   September 12, 1965 MRN:   [id number]  Urgent Office  ASSESSMENT AND PLAN: 1.  Chronic right breast infection s/p drainage  Original I&D by Dr. Dwain Sarna - 03/05/2011 - This was at the 9 o'clock position of right breast.  Last seen by Dwain Sarna - 05/26/2011.  This appears healed.  2.  Right breast abscess at 3 o'clock position.  I&D in office.  Approx 20 cc of pus.  Cultures obtained.  She'll start local wound care with 2-3 dressing changes per day.  Given Vicodin - #30 - 1 refill.  She has Doxycycline.  She has appointment with Dwain Sarna on 06/17/2011.  To call earlier if problem.  HISTORY OF PRESENT ILLNESS: Chief Complaint  Patient presents with  . Routine Post Op    recheck right breast abscess pt is having swelling and redness and feels like there is a knot     Taylor Parker is a 45 y.o. (DOB: 1966-06-27)  hispanic female who is a patient of Ok Edwards, MD, MD and comes to me today for a recurrent right breast infection/abscess.    The patient is accompanied with daughter, Taylor Parker (sp). Her daughter acted as Nurse, learning disability.  She has had recurrent problems with her right breast and recurrent infections.  She was seen by Dr. Dwain Sarna just last week, but now has increasing right breast pain and swelling and comes to urgent clinic.  She had an excision of a chronic right breast infection at the 9 o'clock postion on 03/05/2011.  She now has pain, swelling, and redness at the 3 o'clock position at the edge of the areola.  She said this has been going on about 1 week.  PHYSICAL EXAM: BP 102/74  Pulse 60  Temp(Src) 98.4 F (36.9 C) (Temporal)  Resp 16  Ht 5\' 2"  (1.575 m)  Wt 148 lb 2 oz (67.189 kg)  BMI 27.09 kg/m2  Breasts:  Left breast - neg  Right breast - scar at 9  o'clock - okay, but has new abscess at 3 o'clock position at edge of areola.  Approx 3 cm in diameter.  Procedure:  Right breast prepped with betadine.  Infiltrated with 8 cc of 1% xylocaine.  I did I&D and got out about 20 cc of pus.  Because of recurrent nature of this abscess, cultures were obtained.  (last culture in computer I can find was 03/05/2011)  The wound was packed with saline gauze.    DATA REVIEWED: Old office/hospital records   Ovidio Kin, MD, FACS Office:  [phone]

## 2011-06-02 NOTE — Patient Instructions (Signed)
1.  Continue local wound care.  Soak two to three times per day.  Pack with saline gauze.  2.  You have an appointment to see Dr. Dwain Sarna.

## 2011-06-05 LAB — WOUND CULTURE
Gram Stain: NONE SEEN
Gram Stain: NONE SEEN
Organism ID, Bacteria: NO GROWTH

## 2011-06-17 ENCOUNTER — Ambulatory Visit (INDEPENDENT_AMBULATORY_CARE_PROVIDER_SITE_OTHER): Payer: BC Managed Care – PPO | Admitting: General Surgery

## 2011-06-17 ENCOUNTER — Encounter (INDEPENDENT_AMBULATORY_CARE_PROVIDER_SITE_OTHER): Payer: Self-pay | Admitting: General Surgery

## 2011-06-17 VITALS — BP 104/70 | HR 60 | Temp 97.6°F | Resp 16 | Ht 63.0 in | Wt 149.4 lb

## 2011-06-17 DIAGNOSIS — N61 Mastitis without abscess: Secondary | ICD-10-CM

## 2011-06-17 DIAGNOSIS — N611 Abscess of the breast and nipple: Secondary | ICD-10-CM

## 2011-06-17 NOTE — Progress Notes (Signed)
Patient ID: Taylor Parker, female   DOB: 12/29/1965, 45 y.o.   MRN: [id number]  Chief Complaint  Patient presents with  . Routine Post Op    recheck right breast abscess it is draining and pt feels a small knot     HPI Taylor Parker is a 45 y.o. female.   HPI This is a 45-year-old female being presented to me after being seen Y. limb up orders previously with a worsening chronic right breast abscess. She underwent an extensive I&D as well as a debridement of this year in August 23. At that time I also did a biopsy of a fair amount of this tissue that was consistent with an abscess. In followup she healed by secondary intention. This side has drained some purulence that is mostly resolved but the other side on the left of her nipple has begun to drain as well. This has been seen in our office by Dr. Newman with a recurrent infection requiring drainage. She still continues to have some drainage from both of these primarily the left side at this point. She comes back in a discussed her options. She is still having swelling, redness and feels like there is a knot at the left side of her nipple. Her culture from last excision is no growth.  Past Medical History  Diagnosis Date  . Depression   . PMDD (premenstrual dysphoric disorder)   . Hyperlipidemia   . Breast abscess     right side    Past Surgical History  Procedure Date  . Cesarean section     X 3  . Tubal ligation 2004  . Incise and drain abcess     right breast    History reviewed. No pertinent family history.  Social History History  Substance Use Topics  . Smoking status: Never Smoker   . Smokeless tobacco: Never Used  . Alcohol Use: No    No Known Allergies  No current outpatient prescriptions on file.    Review of Systems Review of Systems  Constitutional: Negative for fever, chills and unexpected weight change.  HENT: Negative for hearing loss, congestion, sore throat, trouble swallowing and  voice change.   Eyes: Negative for visual disturbance.  Respiratory: Negative for cough and wheezing.   Cardiovascular: Negative for chest pain, palpitations and leg swelling.  Gastrointestinal: Negative for nausea, vomiting, abdominal pain, diarrhea, constipation, blood in stool, abdominal distention and anal bleeding.  Genitourinary: Negative for hematuria, vaginal bleeding and difficulty urinating.  Musculoskeletal: Negative for arthralgias.  Skin: Negative for rash and wound.  Neurological: Negative for seizures, syncope and headaches.  Hematological: Negative for adenopathy. Does not bruise/bleed easily.  Psychiatric/Behavioral: Negative for confusion.    Blood pressure 104/70, pulse 60, temperature 97.6 F (36.4 C), temperature source Temporal, resp. rate 16, height 5' 3" (1.6 m), weight 149 lb 6 oz (67.756 kg).  Physical Exam Physical Exam  Constitutional: She appears well-developed and well-nourished.  Neck: Neck supple.  Cardiovascular: Normal rate, regular rhythm and normal heart sounds.   Pulmonary/Chest: Effort normal and breath sounds normal. She has no wheezes. She has no rales. Right breast exhibits skin change. Right breast exhibits no inverted nipple, no mass, no nipple discharge and no tenderness.    Lymphadenopathy:    She has no cervical adenopathy.    Data Reviewed Culture ngtd last i and d  Assessment    Chronic breast abscess    Plan   I think when I first saw her the abscess was very   significant going on for a long time. I debrided this as much as possible at that time and try to leave it open but she continues to have problems. She is at another area shoulder and the other side now. I think we've reached the extent of conservative therapy with drainage and antibiotics at this point. I discussed with her today going back to the operating room and it really excising all retroareolar ductal system that includes these abscess cavities. We discussed the risks  of recurrence, open wound, changing the cosmetic appearance of her breast. She is agreeable to this and I will try to do this this week.       Taylor Parker 06/17/2011, 11:42 AM    

## 2011-06-18 ENCOUNTER — Encounter (HOSPITAL_BASED_OUTPATIENT_CLINIC_OR_DEPARTMENT_OTHER): Payer: Self-pay | Admitting: *Deleted

## 2011-06-19 ENCOUNTER — Ambulatory Visit (HOSPITAL_BASED_OUTPATIENT_CLINIC_OR_DEPARTMENT_OTHER): Payer: BC Managed Care – PPO | Admitting: Certified Registered Nurse Anesthetist

## 2011-06-19 ENCOUNTER — Other Ambulatory Visit (INDEPENDENT_AMBULATORY_CARE_PROVIDER_SITE_OTHER): Payer: Self-pay | Admitting: General Surgery

## 2011-06-19 ENCOUNTER — Encounter (HOSPITAL_BASED_OUTPATIENT_CLINIC_OR_DEPARTMENT_OTHER): Payer: Self-pay | Admitting: Certified Registered Nurse Anesthetist

## 2011-06-19 ENCOUNTER — Encounter (INDEPENDENT_AMBULATORY_CARE_PROVIDER_SITE_OTHER): Payer: Self-pay | Admitting: General Surgery

## 2011-06-19 ENCOUNTER — Ambulatory Visit (HOSPITAL_BASED_OUTPATIENT_CLINIC_OR_DEPARTMENT_OTHER)
Admission: RE | Admit: 2011-06-19 | Discharge: 2011-06-19 | Disposition: A | Discharge status: Home / Self Care | Payer: BC Managed Care – PPO | Source: Ambulatory Visit | Attending: General Surgery | Admitting: General Surgery

## 2011-06-19 ENCOUNTER — Encounter (HOSPITAL_BASED_OUTPATIENT_CLINIC_OR_DEPARTMENT_OTHER): Payer: Self-pay | Admitting: *Deleted

## 2011-06-19 ENCOUNTER — Encounter (HOSPITAL_BASED_OUTPATIENT_CLINIC_OR_DEPARTMENT_OTHER)
Admission: RE | Disposition: A | Discharge status: Home / Self Care | Payer: Self-pay | Source: Ambulatory Visit | Attending: General Surgery

## 2011-06-19 DIAGNOSIS — N61 Mastitis without abscess: Secondary | ICD-10-CM

## 2011-06-19 DIAGNOSIS — D249 Benign neoplasm of unspecified breast: Secondary | ICD-10-CM

## 2011-06-19 HISTORY — PX: BREAST BIOPSY: SHX20

## 2011-06-19 LAB — POCT HEMOGLOBIN-HEMACUE: Hemoglobin: 13 g/dL (ref 12.0–15.0)

## 2011-06-19 SURGERY — BREAST BIOPSY
Anesthesia: General | Site: Breast | Laterality: Right | Wound class: Dirty or Infected

## 2011-06-19 MED ORDER — FENTANYL CITRATE 0.05 MG/ML IJ SOLN
INTRAMUSCULAR | Status: DC | PRN
  Administered 2011-06-19: 50 ug via INTRAVENOUS
  Administered 2011-06-19: 25 ug via INTRAVENOUS
  Administered 2011-06-19 (×2): 50 ug via INTRAVENOUS

## 2011-06-19 MED ORDER — BUPIVACAINE HCL (PF) 0.25 % IJ SOLN
INTRAMUSCULAR | Status: DC | PRN
  Administered 2011-06-19: 10 mL

## 2011-06-19 MED ORDER — CEFAZOLIN SODIUM 1-5 GM-% IV SOLN
1.0000 g | INTRAVENOUS | Status: AC
  Administered 2011-06-19: 1 g via INTRAVENOUS

## 2011-06-19 MED ORDER — HYDROMORPHONE HCL PF 1 MG/ML IJ SOLN
0.2500 mg | INTRAMUSCULAR | Status: DC | PRN
  Administered 2011-06-19 (×2): 0.25 mg via INTRAVENOUS

## 2011-06-19 MED ORDER — ONDANSETRON HCL 4 MG/2ML IJ SOLN
INTRAMUSCULAR | Status: DC | PRN
  Administered 2011-06-19: 4 mg via INTRAVENOUS

## 2011-06-19 MED ORDER — DEXAMETHASONE SODIUM PHOSPHATE 4 MG/ML IJ SOLN
INTRAMUSCULAR | Status: DC | PRN
  Administered 2011-06-19: 10 mg via INTRAVENOUS

## 2011-06-19 MED ORDER — MIDAZOLAM HCL 5 MG/5ML IJ SOLN
INTRAMUSCULAR | Status: DC | PRN
  Administered 2011-06-19: 2 mg via INTRAVENOUS

## 2011-06-19 MED ORDER — LACTATED RINGERS IV SOLN
INTRAVENOUS | Status: DC
  Administered 2011-06-19: 20 mL/h via INTRAVENOUS
  Administered 2011-06-19 (×2): via INTRAVENOUS

## 2011-06-19 MED ORDER — PROMETHAZINE HCL 25 MG/ML IJ SOLN: 6.2500 mg | INTRAMUSCULAR | Status: DC | PRN

## 2011-06-19 MED ORDER — LIDOCAINE HCL (CARDIAC) 20 MG/ML IV SOLN
INTRAVENOUS | Status: DC | PRN
  Administered 2011-06-19: 40 mg via INTRAVENOUS

## 2011-06-19 MED ORDER — OXYCODONE-ACETAMINOPHEN 5-325 MG PO TABS: 1.0000 | ORAL_TABLET | Freq: Four times a day (QID) | ORAL | Status: DC | PRN

## 2011-06-19 MED ORDER — PROPOFOL 10 MG/ML IV EMUL
INTRAVENOUS | Status: DC | PRN
  Administered 2011-06-19: 180 mg via INTRAVENOUS

## 2011-06-19 SURGICAL SUPPLY — 55 items
ADH SKN CLS APL DERMABOND .7 (GAUZE/BANDAGES/DRESSINGS)
APL SKNCLS STERI-STRIP NONHPOA (GAUZE/BANDAGES/DRESSINGS) ×1
APPLIER CLIP 9.375 MED OPEN (MISCELLANEOUS)
APR CLP MED 9.3 20 MLT OPN (MISCELLANEOUS)
BENZOIN TINCTURE PRP APPL 2/3 (GAUZE/BANDAGES/DRESSINGS) ×2 IMPLANT
BINDER BREAST LRG (GAUZE/BANDAGES/DRESSINGS) ×1 IMPLANT
BINDER BREAST MEDIUM (GAUZE/BANDAGES/DRESSINGS) IMPLANT
BINDER BREAST XLRG (GAUZE/BANDAGES/DRESSINGS) IMPLANT
BINDER BREAST XXLRG (GAUZE/BANDAGES/DRESSINGS) IMPLANT
BLADE SURG 15 STRL LF DISP TIS (BLADE) ×1 IMPLANT
BLADE SURG 15 STRL SS (BLADE) ×2
CANISTER SUCTION 1200CC (MISCELLANEOUS) ×1 IMPLANT
CHLORAPREP W/TINT 26ML (MISCELLANEOUS) ×2 IMPLANT
CLIP APPLIE 9.375 MED OPEN (MISCELLANEOUS) IMPLANT
CLOTH BEACON ORANGE TIMEOUT ST (SAFETY) ×2 IMPLANT
COVER MAYO STAND STRL (DRAPES) ×2 IMPLANT
COVER TABLE BACK 60X90 (DRAPES) ×2 IMPLANT
DECANTER SPIKE VIAL GLASS SM (MISCELLANEOUS) IMPLANT
DERMABOND ADVANCED (GAUZE/BANDAGES/DRESSINGS)
DERMABOND ADVANCED .7 DNX12 (GAUZE/BANDAGES/DRESSINGS) IMPLANT
DEVICE DUBIN W/COMP PLATE 8390 (MISCELLANEOUS) IMPLANT
DRAPE PED LAPAROTOMY (DRAPES) ×2 IMPLANT
DRSG TEGADERM 4X4.75 (GAUZE/BANDAGES/DRESSINGS) ×3 IMPLANT
ELECT COATED BLADE 2.86 ST (ELECTRODE) ×2 IMPLANT
ELECT REM PT RETURN 9FT ADLT (ELECTROSURGICAL) ×2
ELECTRODE REM PT RTRN 9FT ADLT (ELECTROSURGICAL) ×1 IMPLANT
GAUZE PACKING IODOFORM 1/2 (PACKING) ×1 IMPLANT
GAUZE SPONGE 4X4 12PLY STRL LF (GAUZE/BANDAGES/DRESSINGS) ×2 IMPLANT
GLOVE BIO SURGEON STRL SZ7 (GLOVE) ×2 IMPLANT
GLOVE BIOGEL PI IND STRL 7.5 (GLOVE) ×1 IMPLANT
GLOVE BIOGEL PI INDICATOR 7.5 (GLOVE) ×1
GLOVE ECLIPSE 6.5 STRL STRAW (GLOVE) ×1 IMPLANT
GOWN PREVENTION PLUS XLARGE (GOWN DISPOSABLE) ×3 IMPLANT
NDL HYPO 25X1 1.5 SAFETY (NEEDLE) ×1 IMPLANT
NEEDLE HYPO 25X1 1.5 SAFETY (NEEDLE) ×2 IMPLANT
NS IRRIG 1000ML POUR BTL (IV SOLUTION) ×1 IMPLANT
PACK BASIN DAY SURGERY FS (CUSTOM PROCEDURE TRAY) ×2 IMPLANT
PENCIL BUTTON HOLSTER BLD 10FT (ELECTRODE) ×2 IMPLANT
SLEEVE SCD COMPRESS KNEE MED (MISCELLANEOUS) ×2 IMPLANT
SPONGE GAUZE 4X4 12PLY (GAUZE/BANDAGES/DRESSINGS) ×1 IMPLANT
SPONGE LAP 4X18 X RAY DECT (DISPOSABLE) ×2 IMPLANT
STRIP CLOSURE SKIN 1/2X4 (GAUZE/BANDAGES/DRESSINGS) ×2 IMPLANT
SUT MNCRL AB 4-0 PS2 18 (SUTURE) ×2 IMPLANT
SUT SILK 2 0 SH (SUTURE) ×2 IMPLANT
SUT VIC AB 2-0 SH 27 (SUTURE) ×2
SUT VIC AB 2-0 SH 27XBRD (SUTURE) ×1 IMPLANT
SUT VIC AB 3-0 SH 27 (SUTURE) ×2
SUT VIC AB 3-0 SH 27X BRD (SUTURE) ×1 IMPLANT
SUT VICRYL AB 3 0 TIES (SUTURE) IMPLANT
SYR CONTROL 10ML LL (SYRINGE) ×2 IMPLANT
TOWEL OR 17X24 6PK STRL BLUE (TOWEL DISPOSABLE) ×2 IMPLANT
TOWEL OR NON WOVEN STRL DISP B (DISPOSABLE) ×2 IMPLANT
TUBE CONNECTING 20X1/4 (TUBING) ×1 IMPLANT
WATER STERILE IRR 1000ML POUR (IV SOLUTION) ×1 IMPLANT
YANKAUER SUCT BULB TIP NO VENT (SUCTIONS) ×1 IMPLANT

## 2011-06-19 NOTE — Transfer of Care (Signed)
Immediate Anesthesia Transfer of Care Note  Patient: Taylor Parker  Procedure(s) Performed:  BREAST BIOPSY - Right breast chronic abscess drainage and debridement  Patient Location: PACU  Anesthesia Type: General  Level of Consciousness: awake, alert , oriented and patient cooperative  Airway & Oxygen Therapy: Patient Spontanous Breathing and Patient connected to face mask oxygen  Post-op Assessment: Report given to PACU RN and Post -op Vital signs reviewed and stable  Post vital signs: Reviewed and stable  Complications: No apparent anesthesia complications

## 2011-06-19 NOTE — Anesthesia Postprocedure Evaluation (Signed)
  Anesthesia Post-op Note  Patient: Taylor Parker  Procedure(s) Performed:  BREAST BIOPSY - Right breast chronic abscess drainage and debridement  Patient Location: PACU  Anesthesia Type: General  Level of Consciousness: awake and alert   Airway and Oxygen Therapy: Patient Spontanous Breathing  Post-op Pain: mild  Post-op Assessment: Post-op Vital signs reviewed, Patient's Cardiovascular Status Stable, Respiratory Function Stable, Patent Airway, No signs of Nausea or vomiting and Pain level controlled  Post-op Vital Signs: Reviewed and stable  Complications: No apparent anesthesia complications

## 2011-06-19 NOTE — Op Note (Signed)
Preoperative diagnosis: Chronic right breast abscess, recurrent Postoperative diagnosis: Same as above  Procedure: Excision and drainage of chronic right breast abscess with central duct excision Surgeon: Dr. Harden Mo Anesthesia: Gen. With LMA Estimated blood loss: Minimal Drains: Wound left open and packed with iodoform gauze Complications: None Specimens: Right breast tissue marked short stitch superior, long stitch lateral, double stitch deep Disposition the patient to recovery room in stable condition Sponge and needle count was correct x2 at completion of operation  Indications: This is a 45 year old female is otherwise healthy who had a chronic right breast abscess when seen her previously. Her to the operating room at that point incised and drained a very large right breast abscess. I took a biopsy at that point of what appeared to be chronic abscess cavity in the pathology was consistent with fat. There is been no evidence of any gross cancer associated with this. The both clinically and pathologically it appeared to be an abscess. She's continue to have some problems the only other side of her nipple with an abscess. This has been attempted to be treated conservatively but this has not been successful. I discussed with her returning to the operating room and excising her central ducts as well as been draining and debriding this chronic abscess cavity.  Procedure: After informed consent was obtained the patient was taken to the operating room. She was administered 1 g of intravenous cefazolin. Sequential compression devices were placed on the legs prior to beginning the operation. She was in place a general anesthesia with an LMA. Her right breast was prepped and draped in the standard sterile surgical fashion. A surgical timeout was performed.  I made a periareolar incision on the left side of her nipple areolar complex her right breast. I carried this down to the central ductal  system this was all scarred in and there was a fair amount of chronic infection in this region. I excised this in its entirety. Once I had done this I noted her old incision as well on the right side of her nipple areolar complex I entered into this and drained a small amount of purulent fluid as well and widely opened this. This was then irrigated copiously. There was no further evidence of any abscess cavity or infection upon completion. I loosely approximated the subcutaneous tissue my incision. I then packed both wounds with iodoform gauze. She tolerated this well was extubated and transferred to the recovery room in stable condition.

## 2011-06-19 NOTE — Anesthesia Preprocedure Evaluation (Addendum)
Anesthesia Evaluation  Patient identified by MRN, date of birth, ID band Patient awake    Reviewed: Allergy & Precautions, H&P , NPO status , Patient's Chart, lab work & pertinent test results  Airway Mallampati: I TM Distance: >3 FB Neck ROM: Full    Dental No notable dental hx. (+) Teeth Intact and Dental Advisory Given   Pulmonary neg pulmonary ROS,  clear to auscultation  Pulmonary exam normal       Cardiovascular neg cardio ROS Regular Normal    Neuro/Psych Negative Neurological ROS     GI/Hepatic negative GI ROS, Neg liver ROS,   Endo/Other  Negative Endocrine ROS  Renal/GU negative Renal ROS  Genitourinary negative   Musculoskeletal   Abdominal   Peds  Hematology negative hematology ROS (+)   Anesthesia Other Findings   Reproductive/Obstetrics LMP 3 weeks ago                          Anesthesia Physical Anesthesia Plan  ASA: I  Anesthesia Plan: General   Post-op Pain Management:    Induction: Intravenous  Airway Management Planned: LMA  Additional Equipment:   Intra-op Plan:   Post-operative Plan:   Informed Consent: I have reviewed the patients History and Physical, chart, labs and discussed the procedure including the risks, benefits and alternatives for the proposed anesthesia with the patient or authorized representative who has indicated his/her understanding and acceptance.   Dental advisory given  Plan Discussed with: CRNA and Surgeon  Anesthesia Plan Comments: (Plan routine monitors GA- LMA OK)        Anesthesia Quick Evaluation

## 2011-06-19 NOTE — Progress Notes (Signed)
Pt dressing changed, placed new guaze and tegaderm to site.  Paper tape roll given to pt.  Since she states regular tape bothers her skin thru her interpeter at bedside.  Pink breast binder in place on discharge

## 2011-06-19 NOTE — H&P (View-Only) (Signed)
Patient ID: Taylor Parker, female   DOB: 1966/02/09, 45 y.o.   MRN: [id number]  Chief Complaint  Patient presents with  . Routine Post Op    recheck right breast abscess it is draining and pt feels a small knot     HPI Taylor Parker is a 45 y.o. female.   HPI This is a 45 year old female being presented to me after being seen Y. limb up orders previously with a worsening chronic right breast abscess. She underwent an extensive I&D as well as a debridement of this year in August 23. At that time I also did a biopsy of a fair amount of this tissue that was consistent with an abscess. In followup she healed by secondary intention. This side has drained some purulence that is mostly resolved but the other side on the left of her nipple has begun to drain as well. This has been seen in our office by Dr. Ezzard Parker with a recurrent infection requiring drainage. She still continues to have some drainage from both of these primarily the left side at this point. She comes back in a discussed her options. She is still having swelling, redness and feels like there is a knot at the left side of her nipple. Her culture from last excision is no growth.  Past Medical History  Diagnosis Date  . Depression   . PMDD (premenstrual dysphoric disorder)   . Hyperlipidemia   . Breast abscess     right side    Past Surgical History  Procedure Date  . Cesarean section     X 3  . Tubal ligation 2004  . Incise and drain abcess     right breast    History reviewed. No pertinent family history.  Social History History  Substance Use Topics  . Smoking status: Never Smoker   . Smokeless tobacco: Never Used  . Alcohol Use: No    No Known Allergies  No current outpatient prescriptions on file.    Review of Systems Review of Systems  Constitutional: Negative for fever, chills and unexpected weight change.  HENT: Negative for hearing loss, congestion, sore throat, trouble swallowing and  voice change.   Eyes: Negative for visual disturbance.  Respiratory: Negative for cough and wheezing.   Cardiovascular: Negative for chest pain, palpitations and leg swelling.  Gastrointestinal: Negative for nausea, vomiting, abdominal pain, diarrhea, constipation, blood in stool, abdominal distention and anal bleeding.  Genitourinary: Negative for hematuria, vaginal bleeding and difficulty urinating.  Musculoskeletal: Negative for arthralgias.  Skin: Negative for rash and wound.  Neurological: Negative for seizures, syncope and headaches.  Hematological: Negative for adenopathy. Does not bruise/bleed easily.  Psychiatric/Behavioral: Negative for confusion.    Blood pressure 104/70, pulse 60, temperature 97.6 F (36.4 C), temperature source Temporal, resp. rate 16, height 5\' 3"  (1.6 m), weight 149 lb 6 oz (67.756 kg).  Physical Exam Physical Exam  Constitutional: She appears well-developed and well-nourished.  Neck: Neck supple.  Cardiovascular: Normal rate, regular rhythm and normal heart sounds.   Pulmonary/Chest: Effort normal and breath sounds normal. She has no wheezes. She has no rales. Right breast exhibits skin change. Right breast exhibits no inverted nipple, no mass, no nipple discharge and no tenderness.    Lymphadenopathy:    She has no cervical adenopathy.    Data Reviewed Culture ngtd last i and d  Assessment    Chronic breast abscess    Plan   I think when I first saw her the abscess was very  significant going on for a long time. I debrided this as much as possible at that time and try to leave it open but she continues to have problems. She is at another area shoulder and the other side now. I think we've reached the extent of conservative therapy with drainage and antibiotics at this point. I discussed with her today going back to the operating room and it really excising all retroareolar ductal system that includes these abscess cavities. We discussed the risks  of recurrence, open wound, changing the cosmetic appearance of her breast. She is agreeable to this and I will try to do this this week.       Taylor Parker 06/17/2011, 11:42 AM

## 2011-06-19 NOTE — Anesthesia Procedure Notes (Signed)
Procedure Name: LMA Insertion Date/Time: 06/19/2011 7:36 AM Performed by: Lehua Flores D Pre-anesthesia Checklist: Patient identified, Emergency Drugs available, Suction available and Patient being monitored Patient Re-evaluated:Patient Re-evaluated prior to inductionOxygen Delivery Method: Circle System Utilized Preoxygenation: Pre-oxygenation with 100% oxygen Intubation Type: IV induction Ventilation: Mask ventilation without difficulty LMA: LMA inserted LMA Size: 4.0 Number of attempts: 1 Placement Confirmation: positive ETCO2 Tube secured with: Tape Dental Injury: Teeth and Oropharynx as per pre-operative assessment

## 2011-06-19 NOTE — Interval H&P Note (Signed)
History and Physical Interval Note:  06/19/2011 7:17 AM  Taylor Parker  has presented today for surgery, with the diagnosis of Right breast chronic abscess  The various methods of treatment have been discussed with the patient and family. After consideration of risks, benefits and other options for treatment, the patient has consented to  Procedure(s): Right breast duct excision and abscess drainage as a surgical intervention .  The patients' history has been reviewed, patient examined, no change in status, stable for surgery.  I have reviewed the patients' chart and labs.  Questions were answered to the patient's satisfaction.     [name]

## 2011-06-22 ENCOUNTER — Telehealth (INDEPENDENT_AMBULATORY_CARE_PROVIDER_SITE_OTHER): Payer: Self-pay

## 2011-06-22 NOTE — Telephone Encounter (Signed)
Pt notified that her breast tissue that was removed last week shows no cancer per Dr Rolan Bucco

## 2011-06-23 ENCOUNTER — Ambulatory Visit (INDEPENDENT_AMBULATORY_CARE_PROVIDER_SITE_OTHER): Payer: BC Managed Care – PPO | Admitting: General Surgery

## 2011-06-23 ENCOUNTER — Encounter (INDEPENDENT_AMBULATORY_CARE_PROVIDER_SITE_OTHER): Payer: Self-pay | Admitting: General Surgery

## 2011-06-23 VITALS — BP 110/72 | HR 60 | Temp 97.2°F | Resp 16 | Ht 62.0 in | Wt 146.1 lb

## 2011-06-23 DIAGNOSIS — Z09 Encounter for follow-up examination after completed treatment for conditions other than malignant neoplasm: Secondary | ICD-10-CM

## 2011-06-23 NOTE — Progress Notes (Signed)
Subjective:     Patient ID: Taylor Parker, female   DOB: August 25, 1965, 45 y.o.   MRN: [id number]  HPI This is a 45 year old female with a chronic right breast abscess. I took her back to the operating room and did a right breast abscess I&D. I did another biopsy this was an intraductal papilloma with an abscess. There is still no evidence of malignancy. I basically removed all of her central ducts at this point as I think that's where this was coming from. She returns today doing well without complaints. She is a pack and placed on the operating room.  Review of Systems     Objective:   Physical Exam Right breast with 2 open wounds, no infection now, packing in place    Assessment:     Chronic right breast abscess    Plan:     She is going to do daily dressing changes on these now. I told her she can begin to shower. She can work. Hopefully this will take care of the problem and prevent any other further abscess from forming. I see her back in one week.

## 2011-06-23 NOTE — Patient Instructions (Signed)
Change dressing once daily.  May shower

## 2011-06-30 ENCOUNTER — Ambulatory Visit (INDEPENDENT_AMBULATORY_CARE_PROVIDER_SITE_OTHER): Payer: BC Managed Care – PPO | Admitting: General Surgery

## 2011-06-30 ENCOUNTER — Encounter (INDEPENDENT_AMBULATORY_CARE_PROVIDER_SITE_OTHER): Payer: Self-pay | Admitting: General Surgery

## 2011-06-30 VITALS — BP 98/62 | HR 68 | Temp 97.6°F | Resp 16 | Ht 62.0 in | Wt 145.4 lb

## 2011-06-30 DIAGNOSIS — Z09 Encounter for follow-up examination after completed treatment for conditions other than malignant neoplasm: Secondary | ICD-10-CM

## 2011-06-30 NOTE — Progress Notes (Signed)
Subjective:     Patient ID: Taylor Parker, female   DOB: [birth year], 45 y.o.   MRN: [id number]  HPI This is a 45 year old female returns today for a wound check after drainage of a chronic right breast abscess. She is doing well. She do her dressing changes. She has no complaints.  Review of Systems     Objective:   Physical Exam    right breast with lateral wound healed, medial wound with pink base repacked today Assessment:     Chronic right breast abscess s/p i and d    Plan:     Continue packing Will see again 2 weeks.

## 2011-07-14 HISTORY — PX: VARICOSE VEIN SURGERY: SHX832

## 2011-07-16 ENCOUNTER — Ambulatory Visit (INDEPENDENT_AMBULATORY_CARE_PROVIDER_SITE_OTHER): Payer: BC Managed Care – PPO | Admitting: General Surgery

## 2011-07-16 ENCOUNTER — Encounter (INDEPENDENT_AMBULATORY_CARE_PROVIDER_SITE_OTHER): Payer: Self-pay | Admitting: General Surgery

## 2011-07-16 VITALS — BP 98/72 | HR 64 | Temp 98.2°F | Resp 12 | Ht 62.0 in | Wt 149.0 lb

## 2011-07-16 DIAGNOSIS — Z09 Encounter for follow-up examination after completed treatment for conditions other than malignant neoplasm: Secondary | ICD-10-CM

## 2011-07-16 NOTE — Progress Notes (Signed)
Subjective:     Patient ID: Taylor Parker, female   DOB: December 12, 1965, 46 y.o.   MRN: [id number]  HPI This is a 46 year old female with a chronic right breast abscess. I took her back to the operating room and did a right breast abscess I&D. I did another biopsy this was an intraductal papilloma with an abscess. She returns today without complaint.  The wounds have healed.   Review of Systems     Objective:   Physical Exam Healed right breast wounds with no evidence of infection    Assessment:     Chronic right breast abscess    Plan:     Return to full activity Will return in six months for re-exam MMG in March

## 2011-07-28 ENCOUNTER — Encounter (INDEPENDENT_AMBULATORY_CARE_PROVIDER_SITE_OTHER): Payer: Self-pay | Admitting: General Surgery

## 2011-07-28 ENCOUNTER — Ambulatory Visit (INDEPENDENT_AMBULATORY_CARE_PROVIDER_SITE_OTHER): Payer: BC Managed Care – PPO | Admitting: General Surgery

## 2011-07-28 VITALS — BP 100/64 | HR 68 | Temp 98.2°F | Resp 16 | Ht 62.0 in | Wt 147.0 lb

## 2011-07-28 DIAGNOSIS — Z09 Encounter for follow-up examination after completed treatment for conditions other than malignant neoplasm: Secondary | ICD-10-CM

## 2011-07-28 NOTE — Progress Notes (Signed)
Subjective:     Patient ID: Taylor Parker, female   DOB: 1965/12/31, 46 y.o.   MRN: [id number]  HPI This is a 46 year old female under well from a chronic right breast abscess. To the operating room for a second time and basically did a retroareolar duct excision. This is healed now. She has some pain at the lateral portion of her old incision as well as a stitch coming out from the medial incision. I clipped the stitch today. She denies any fevers. She denies any drainage. I think she is still concern that there may be a breast cancer as a source of this and she still feels a fair amount of abnormality that I think is mostly her chronic breast abscess. I have done a biopsy of this that is consistent with the clinical appearance of a chronic breast abscess also.  Review of Systems     Objective:   Physical Exam Healed medial right breast incision with small knot protruding that I clipped Lateral incision mildly tender but no evidence of infection today    Assessment:     Chronic right breast abscess    Plan:     I reassured her today the abdomen everything possible to try to prove that she does not have breast cancer. She is due to get a repeat mammogram coming up soon in March. I will plan on seeing her after that. I really identify any infection. I am concerned however given her history and asked her  to call if she notes any other changes.

## 2011-08-10 ENCOUNTER — Encounter (INDEPENDENT_AMBULATORY_CARE_PROVIDER_SITE_OTHER): Payer: Self-pay | Admitting: General Surgery

## 2011-08-10 ENCOUNTER — Ambulatory Visit (INDEPENDENT_AMBULATORY_CARE_PROVIDER_SITE_OTHER): Payer: BC Managed Care – PPO | Admitting: General Surgery

## 2011-08-10 VITALS — BP 116/62 | HR 80 | Temp 97.4°F | Resp 16 | Ht 62.0 in | Wt 145.4 lb

## 2011-08-10 DIAGNOSIS — N61 Mastitis without abscess: Secondary | ICD-10-CM

## 2011-08-10 DIAGNOSIS — N611 Abscess of the breast and nipple: Secondary | ICD-10-CM

## 2011-08-10 MED ORDER — DOXYCYCLINE HYCLATE 100 MG PO TABS: 100.0000 mg | ORAL_TABLET | Freq: Two times a day (BID) | ORAL | Status: DC

## 2011-08-10 NOTE — Progress Notes (Signed)
Subjective:     Patient ID: Taylor Parker, female   DOB: 1965/09/11, 46 y.o.   MRN: [id number]  HPI This is a 46 year old female that I know well from a large chronic right breast abscess. I've seen her a number of times and today she comes back in with the area on the lateral portion of her right breast near her nipple that has been increasingly painful and has been draining. She reports no fevers and no other complaints.  Review of Systems     Objective:   Physical Exam  Pulmonary/Chest:         Assessment:     Recurrent right breast abscess    Plan:     Clinically there is a small recurrent abscess at the lateral portion of her NAC. At the medial aspect where I did the larger abscess drainage as well as a biopsy this is all normal. I discussed an I&D of this area with her. I cleansed this area. I infiltrated local anesthetic and  I made an incision with return of fluid. I spread this pocket open and packed this with iodoform gauze. She's want to come back on Thursday I will remove this packing.

## 2011-08-13 ENCOUNTER — Telehealth (INDEPENDENT_AMBULATORY_CARE_PROVIDER_SITE_OTHER): Payer: Self-pay

## 2011-08-13 ENCOUNTER — Encounter (INDEPENDENT_AMBULATORY_CARE_PROVIDER_SITE_OTHER): Payer: Self-pay | Admitting: General Surgery

## 2011-08-13 ENCOUNTER — Ambulatory Visit (INDEPENDENT_AMBULATORY_CARE_PROVIDER_SITE_OTHER): Payer: BC Managed Care – PPO | Admitting: General Surgery

## 2011-08-13 VITALS — BP 98/62 | HR 70 | Resp 16 | Ht 62.0 in | Wt 145.0 lb

## 2011-08-13 DIAGNOSIS — N611 Abscess of the breast and nipple: Secondary | ICD-10-CM

## 2011-08-13 DIAGNOSIS — N61 Mastitis without abscess: Secondary | ICD-10-CM

## 2011-08-13 NOTE — Telephone Encounter (Signed)
Called [facility] to notify them that we will schedule her mgm once she gets the clearance from Dr Dwain Sarna.

## 2011-08-13 NOTE — Progress Notes (Signed)
Subjective:     Patient ID: Taylor Parker, female   DOB: 1965/11/09, 46 y.o.   MRN: [id number]  HPI This is a 46 year old female who I know well from a very large right breast abscess. She had a small area that I drained several days ago. I returned today to take out her packing. She feels much better and denies any fevers.  Review of Systems     Objective:   Physical Exam Right breast infection now cleared, small open wound I removed packing today    Assessment:     Right breast abscess    Plan:     I removed packing today and repacked, she can remove Saturday I will  See back in one week

## 2011-08-19 ENCOUNTER — Encounter (INDEPENDENT_AMBULATORY_CARE_PROVIDER_SITE_OTHER): Payer: Self-pay | Admitting: General Surgery

## 2011-08-19 ENCOUNTER — Other Ambulatory Visit (INDEPENDENT_AMBULATORY_CARE_PROVIDER_SITE_OTHER): Payer: Self-pay | Admitting: Surgery

## 2011-08-19 ENCOUNTER — Ambulatory Visit (INDEPENDENT_AMBULATORY_CARE_PROVIDER_SITE_OTHER): Payer: BC Managed Care – PPO | Admitting: General Surgery

## 2011-08-19 VITALS — BP 96/64 | HR 62 | Temp 97.0°F | Resp 16 | Ht 62.0 in | Wt 145.2 lb

## 2011-08-19 DIAGNOSIS — N61 Mastitis without abscess: Secondary | ICD-10-CM

## 2011-08-19 DIAGNOSIS — N611 Abscess of the breast and nipple: Secondary | ICD-10-CM

## 2011-08-19 MED ORDER — DOXYCYCLINE HYCLATE 100 MG PO TABS: 100.0000 mg | ORAL_TABLET | Freq: Two times a day (BID) | ORAL | Status: AC

## 2011-08-19 NOTE — Progress Notes (Signed)
Subjective:     Patient ID: Taylor Parker, female   DOB: [birth year], 46 y.o.   MRN: [id number]  HPI 12 yof with large right breast abscess that has been difficult to treat.  I drained a new area last week that has healed just fine but today she returns with lower inner quadrant tender mass that is red.  She has no fever and no drainage.  She is still concerned there is cancer.  Review of Systems     Objective:   Physical Exam  Pulmonary/Chest: Right breast exhibits mass, skin change and tenderness. Right breast exhibits no inverted nipple and no nipple discharge.         Assessment:     Breast abscess    Plan:     I drained this today with return of purulent fluid.  I packed with iodoform.  This is a new area from all the others we have taken care of.  I will see her back in 2 days.  Will start longer course of abx and await cultures.  I have done all I can to ensure this is not cancer with biopsy etc.  I think she just continues to have recurrent infections and is disheartening there is another one in an entirely new place

## 2011-08-21 ENCOUNTER — Encounter (INDEPENDENT_AMBULATORY_CARE_PROVIDER_SITE_OTHER): Payer: Self-pay | Admitting: General Surgery

## 2011-08-21 ENCOUNTER — Ambulatory Visit (INDEPENDENT_AMBULATORY_CARE_PROVIDER_SITE_OTHER): Payer: BC Managed Care – PPO | Admitting: General Surgery

## 2011-08-21 VITALS — BP 98/62 | HR 60 | Temp 97.3°F | Resp 16 | Ht 62.0 in | Wt 147.8 lb

## 2011-08-21 DIAGNOSIS — Z09 Encounter for follow-up examination after completed treatment for conditions other than malignant neoplasm: Secondary | ICD-10-CM

## 2011-08-21 NOTE — Progress Notes (Signed)
Subjective:     Patient ID: Taylor Parker, female   DOB: 11-16-1965, 46 y.o.   MRN: [id number]  HPI 46 year old female with recurrent chronic right breast abscess. ID other areas healed and she developed a new one in the lower inner quadrant of her right breast. I drained this a couple days ago she returns today for follow up now.  Review of Systems     Objective:   Physical Exam Open incision with packing in place, minimal erythema small knot at medial aspect, no purulent drainage and base is clean    Assessment:     Recurrent right breast abscess    Plan:     Her right breast abscesses have been very difficult to clear. I had the first area  cleared and she was doing very well. The area that now has an infection did not have an abscess before. This has been drained now. I repacked this today she can return on Monday to have this packed I will plan on removing her packing next week and hopefully letting this area heal. I am going to keep her on a prolonged course of antibiotics now as well. Her cultures are still pending. I do not think and reiterated to her today that there is any cancer as the source of this. I've done everything including a surgical biopsy of this area multiple radiologic test to ensure that this is the case in this acts like an infection. I think hopefully we can get this to clear and then we'll plan on getting her routine screening mammogram at some point once this heals.

## 2011-08-22 LAB — WOUND CULTURE
Gram Stain: NONE SEEN
Organism ID, Bacteria: NO GROWTH

## 2011-08-24 ENCOUNTER — Telehealth (INDEPENDENT_AMBULATORY_CARE_PROVIDER_SITE_OTHER): Payer: Self-pay | Admitting: General Surgery

## 2011-08-24 ENCOUNTER — Encounter (INDEPENDENT_AMBULATORY_CARE_PROVIDER_SITE_OTHER): Payer: BC Managed Care – PPO

## 2011-08-24 NOTE — Telephone Encounter (Signed)
Patient will be coming in a little after 1:30pm

## 2011-08-28 ENCOUNTER — Encounter (INDEPENDENT_AMBULATORY_CARE_PROVIDER_SITE_OTHER): Payer: BC Managed Care – PPO | Admitting: General Surgery

## 2011-08-28 ENCOUNTER — Encounter (INDEPENDENT_AMBULATORY_CARE_PROVIDER_SITE_OTHER): Payer: Self-pay | Admitting: General Surgery

## 2011-08-28 ENCOUNTER — Ambulatory Visit (INDEPENDENT_AMBULATORY_CARE_PROVIDER_SITE_OTHER): Payer: BC Managed Care – PPO | Admitting: General Surgery

## 2011-08-28 VITALS — BP 98/66 | HR 56 | Temp 96.8°F | Resp 12 | Ht 62.0 in | Wt 146.2 lb

## 2011-08-28 DIAGNOSIS — N611 Abscess of the breast and nipple: Secondary | ICD-10-CM

## 2011-08-28 DIAGNOSIS — N61 Mastitis without abscess: Secondary | ICD-10-CM

## 2011-08-28 NOTE — Progress Notes (Signed)
Subjective:     Patient ID: Taylor Parker, female   DOB: 1965/11/21, 46 y.o.   MRN: [id number]  HPI This is a 46 year old female with recurrent right breast abscesses. She returns today after having her packing removed earlier this week. She has no complaints today.  Review of Systems     Objective:   Physical Exam Right breast with multiple healed incisions with no evidence infection currently    Assessment:     Recurrent right breast abscesses    Plan:     Will complete prolonged course antibiotics Return in 2 weeks hopefully we will have cleared this up Will send for mmg at that time

## 2011-09-03 ENCOUNTER — Encounter: Payer: Self-pay | Admitting: Gynecology

## 2011-09-03 ENCOUNTER — Ambulatory Visit (INDEPENDENT_AMBULATORY_CARE_PROVIDER_SITE_OTHER): Payer: BC Managed Care – PPO | Admitting: Gynecology

## 2011-09-03 VITALS — BP 118/72 | Ht 61.5 in | Wt 143.0 lb

## 2011-09-03 DIAGNOSIS — Z01419 Encounter for gynecological examination (general) (routine) without abnormal findings: Secondary | ICD-10-CM

## 2011-09-03 DIAGNOSIS — E785 Hyperlipidemia, unspecified: Secondary | ICD-10-CM

## 2011-09-03 DIAGNOSIS — Z0000 Encounter for general adult medical examination without abnormal findings: Secondary | ICD-10-CM

## 2011-09-03 DIAGNOSIS — R635 Abnormal weight gain: Secondary | ICD-10-CM

## 2011-09-03 LAB — CBC WITH DIFFERENTIAL/PLATELET
Basophils Absolute: 0 10*3/uL (ref 0.0–0.1)
Basophils Relative: 1 % (ref 0–1)
Eosinophils Absolute: 0.1 10*3/uL (ref 0.0–0.7)
Eosinophils Relative: 2 % (ref 0–5)
HCT: 39.7 % (ref 36.0–46.0)
Hemoglobin: 13.2 g/dL (ref 12.0–15.0)
Lymphocytes Relative: 43 % (ref 12–46)
Lymphs Abs: 2.2 10*3/uL (ref 0.7–4.0)
MCH: 28.8 pg (ref 26.0–34.0)
MCHC: 33.2 g/dL (ref 30.0–36.0)
MCV: 86.7 fL (ref 78.0–100.0)
Monocytes Absolute: 0.4 10*3/uL (ref 0.1–1.0)
Monocytes Relative: 8 % (ref 3–12)
Neutro Abs: 2.3 10*3/uL (ref 1.7–7.7)
Neutrophils Relative %: 47 % (ref 43–77)
Platelets: 228 10*3/uL (ref 150–400)
RBC: 4.58 MIL/uL (ref 3.87–5.11)
RDW: 15.4 % (ref 11.5–15.5)
WBC: 5 10*3/uL (ref 4.0–10.5)

## 2011-09-03 LAB — LIPID PANEL
Cholesterol: 194 mg/dL (ref 0–200)
HDL: 24 mg/dL — ABNORMAL LOW (ref >39)
Total CHOL/HDL Ratio: 8.1 Ratio
Triglycerides: 1113 mg/dL — ABNORMAL HIGH (ref <150)

## 2011-09-03 LAB — GLUCOSE, RANDOM: Glucose, Bld: 86 mg/dL (ref 70–99)

## 2011-09-03 LAB — TSH: TSH: 0.837 u[IU]/mL (ref 0.350–4.500)

## 2011-09-03 LAB — ALT: ALT: 10 U/L (ref 0–35)

## 2011-09-03 LAB — AST: AST: 16 U/L (ref 0–37)

## 2011-09-03 MED ORDER — ATORVASTATIN CALCIUM 10 MG PO TABS: 10.0000 mg | ORAL_TABLET | Freq: Every day | ORAL | Status: DC

## 2011-09-03 NOTE — Patient Instructions (Addendum)
Mamografia le toca en Agosto                                                    Control del colesterol  Los niveles de colesterol en el organismo estn determinados significativamente por su dieta. Los niveles de colesterol tambin se relacionan con la enfermedad cardaca. El material que sigue ayuda a Software engineer relacin y a Chiropractor qu puede hacer para mantener su corazn sano. No todo el colesterol es [place]. Las lipoprotenas de baja densidad (LDL) forman el colesterol "malo". El colesterol malo puede ocasionar depsitos de grasa que se acumulan en el interior de las arterias. Las lipoprotenas de alta densidad (HDL) es el colesterol "bueno". Ayuda a remover el colesterol LDL "malo" de la [place]. El colesterol es un factor de riesgo muy importante para la enfermedad cardaca. Otros factores de riesgo son la hipertensin arterial, el hbito de fumar, el estrs, la herencia y [address].  El msculo cardaco obtiene el suministro de sangre a travs de las arterias coronarias. Si su colesterol LDL ("malo") est elevado y el HDL ("bueno") es bajo, tiene un factor de riesgo para que se formen depsitos de Holiday representative en las arterias coronarias (los vasos sanguneos que suministran sangre al corazn). Esto hace que haya menos lugar para que la sangre circule. Sin la suficiente sangre y oxgeno, el msculo cardaco no puede funcionar correctamente, y usted podr sentir dolores en el pecho (angina pectoris). Cuando una arteria coronaria se cierra completamente, una parte del msculo cardaco puede morir (infarto de miocardio). CONTROL DEL COLESTEROL Cuando el profesional que lo asiste enva la sangre al laboratorio para Artist nivel de colesterol, puede realizarle tambin un perfil completo de los lpidos. Con esta prueba, se puede determinar la cantidad total de colesterol, as como los niveles de LDL y HDL. Los triglicridos son un tipo de grasa que circula en la sangre y que tambin puede utilizarse para  determinar el riesgo de enfermedad cardaca. En la siguiente tabla se establecen los nmeros ideales: Prueba: Colesterol total  Menos de 200 mg/dl.  Prueba: LDL "colesterol malo"  Menos de 100 mg/dl.   Menos de 70 mg/dl si tiene riesgo muy elevado de sufrir un ataque cardaco o muerte cardaca sbita.  Prueba: HDL "colesterol bueno"  Mujeres: Ms de 50 mg/dl.   Hombres: Ms de 40 mg/dl.  Prueba: Trigliceridos  Menos de 150 mg/dl.  CONTROL DEL COLESTEROL CON DIETA Aunque factores como el ejercicio y el estilo de vida son importantes, la "primera lnea de ataque" es la dieta. Esto se debe a que se sabe que ciertos alimentos hacen subir el colesterol y otros lo [place]. El objetivo debe ser [employer] alimentos, de modo que tengan un efecto sobre el colesterol y, an ms importante, Microbiologist las grasas saturadas y trans con otros tipos de grasas, como las monoinsaturadas y las poliinsaturadas y cidos grasos omega-3 . En promedio, una persona no debe consumir ms de 15 a 17 g de grasas saturadas por [employer]. Las grasas saturadas y trans se consideran grasas "malas", ya que elevan el colesterol LDL. Las grasas saturadas se encuentran principalmente en productos animales como carne, [place] y crema. Pero esto no significa que usted Marketing executive todas sus comidas favoritas. Actualmente, como lo muestra el cuadro que figura al final de este documento, hay sustitutos de buen  sabor, bajos en grasas y en colesterol, para la mayora de los alimentos que a usted Musician. Elija aquellos alimentos alternativos que sean bajos en grasas o sin grasas. Elija cortes de carne del cuarto trasero o lomo ya que estos cortes son los que tienen menor cantidad de grasa y Oncologist. El pollo (sin piel), el pescado, la carne de ternera, y la [place] de [place] molida son excelentes opciones. Elimine las carnes [employer] o el salami. Los [employer] o nada de grasas saturadas. Cuando consuma  carne [place], carne de aves de corral, o pescado, hgalo en porciones de 85 gramos (3 onzas). Las grasas trans tambin se llaman "aceites parcialmente hidrogenados". Son aceites manipulados cientficamente de Short que son slidos a Publishing rights manager, tienen una larga vida y Glass blower/designer sabor y la textura de los alimentos a los que se Scientist, clinical (histocompatibility and immunogenetics). Las grasas trans se encuentran en la [place], [place], crackers y alimentos horneados.  Para hornear y cocinar, el aceite es un excelente sustituto para la [place]. Los aceites monoinsaturados tienen un beneficio particular, ya que se cree que disminuyen el colesterol LDL (colesterol malo) y elevan el HDL. Deber evitar los aceites tropicales saturados como el de coco y el de [place].  Recuerde, adems, que puede comer sin restricciones los grupos de alimentos que son naturalmente libres de grasas saturadas y Neurosurgeon trans, entre los que se incluyen el pescado, las frutas (excepto el [place]), verduras, frijoles, cereales (cebada, arroz, [place], trigo) y las pastas (sin salsas con crema)  IDENTIFIQUE LOS ALIMENTOS QUE DISMINUYEN EL COLESTEROL  Pueden disminuir el colesterol las fibras solubles que estn en las frutas, como las [place], en los vegetales como el brcoli, las patatas y las zanahorias; en las legumbres como frijoles, guisantes y Therapist, occupational; y en los cereales como la cebada. Los alimentos fortificados con fitosteroles tambin Engineer, production. Debe consumir al menos 2 g de estos alimentos a diario para Financial planner de disminucin de [place].  En el supermercado, lea las etiquetas de los envases para identificar los alimentos bajos en grasas saturadas, libres de grasas trans y bajos en [place], . Elija quesos que tengan solo de 2 a 3 g de grasa saturada por onza (28,35 g). Use una margarina que no dae el corazn, [place] de grasas trans o aceite parcialmente hidrogenado. Al comprar alimentos horneados (galletitas dulces y Gaff[place]) evite  el aceite parcialmente hidrogenado. Los panes y bollos debern ser de granos enteros (harina de maz o de avena entera, en lugar de "harina" o "harina enriquecida"). Compre sopas en lata que no sean cremosas, con bajo contenido de sal y sin grasas adicionadas.  TCNICAS DE PREPARACIN DE LOS ALIMENTOS  Nunca fra los alimentos en aceite abundante. Si debe frer, hgalo en poco aceite y removiendo [place], porque as se utilizan muy pocas grasas, o utilice un spray antiadherente. Cuando le sea posible, hierva, hornee o ase las carnes y cocine los vegetales al vapor. En vez de [employer] con mantequilla o [place], utilice limn y hierbas, pur de Psychologist, educational y canela (para las calabazas y batatas), yogurt y salsa descremados y aderezos para ensaladas bajos en contenido graso.  BAJO EN GRASAS SATURADAS / SUSTITUTOS BAJOS EN GRASA  Carnes / Grasas saturadas (g)  Evite: Bife, corte graso (3 oz/85 g) / 11 g   Elija: Bife, corte magro (3 oz/85 g) / 4 g   Evite: Hamburguesa (3 oz/85 g) / 7 g   Elija:  [place] magra (  3 oz/85 g) / 5 g   Evite: Jamn (3 oz/85 g) / 6 g   Elija:  Jamn magro (3 oz/85 g) / 2.4 g   Evite: Pollo, con piel (3 oz/85 g), Carne oscura / 4 g   Elija:  Pollo, sin piel (3 oz/85 g), Carne oscura / 2 g   Evite: Pollo, con piel (3 oz/85 g), Carne magra / 2.5 g   Elija: Pollo, sin piel (3 oz/85 g), Carne magra / 1 g  Lcteos / Grasas saturadas (g)  Evite: Leche entera (1 taza) / 5 g   Elija: Leche con bajo contenido de grasa, 2% (1 taza) / 3 g   Elija: Leche con bajo contenido de grasa, 1% (1 taza) / 1.5 g   Elija: Leche descremada (1 taza) / 0.3 g   Evite: Queso duro (1 oz/28 g) / 6 g   Elija: Queso descremado (1 oz/28 g) / 2-3 g   Evite: Queso cottage, 4% grasa (1 taza)/ 6.5 g   Elija: Queso cottage con bajo contenido de grasa, 1% grasa (1 taza)/ 1.5 g   Evite: Helado (1 taza) / 9 g   Elija: Sorbete (1 taza) / 2.5 g   Elija: Yogurt helado  sin contenido de grasa (1 taza) / 0.3 g   Elija: Barras de fruta congeladas / vestigios   Evite: Crema batida (1 cucharada) / 3.5 g   Elija: Batidos glac sin lcteos (1 cucharada) / 1 g  Condimentos / Grasas saturadas (g)  Evite: Mayonesa (1 cucharada) / 2 g   Elija: Mayonesa con bajo contenido de grasa (1 cucharada) / 1 g   Evite: [place] (1 cucharada) / 7 g   Elija: Margarina extra light (1 cucharada) / 1 g   Evite: Aceite de coco (1 cucharada) / 11.8 g   Elija: Aceite de oliva (1 cucharada) / 1.8 g   Elija: Aceite de maz (1 cucharada) / 1.7 g   Elija: Aceite de crtamo (1 cucharada) / 1.2 g   Elija: Aceite de girasol (1 cucharada) / 1.4 g   Elija: Aceite de soja (1 cucharada) / 2.4 g   Elija: Aceite de canola (1 cucharada) / 1 g  Document Released: 06/29/2005 Document Revised: 03/11/2011 [facility] Patient Information 2012 [place], [place].

## 2011-09-03 NOTE — Progress Notes (Signed)
Taylor Parker 05-09-1966 [id number]   History:    46 y.o.  for annual exam with no major gynecological complaints. Patient has a prior tubal sterilization procedure. Patient is having normal menstrual cycles. Patient's mammogram August 2012 suspicious for right breast abscess. Patient has been followed by Dr. Dwain Sarna who has done 2 incision and drainage of her right breast abscess for which patient's currently on doxycycline. Review of her record indicated that she has hyperlipidemia and had been placed on Lipitor 10 mg daily but she had discontinued because she did not come for refill and it was during the time that she was getting evaluated and treated for her breast abscess. Her last Pap smear was in 2012 which was normal. Patient also has issues with weight.  Past medical history,surgical history, family history and social history were all reviewed and documented in the EPIC chart.  Gynecologic History Patient's last menstrual period was 08/10/2011. Contraception: tubal ligation Last Pap: 2012. Results were: normal Last mammogram: 2012. Results were: Right breast abscess  Obstetric History OB History    Grav Para Term Preterm Abortions TAB SAB Ect Mult Living   3 3 3       3      # Outc Date GA Lbr Len/2nd Wgt Sex Del Anes PTL Lv   1 TRM     F CS  No Yes   2 TRM     M CS  No Yes   3 TRM     M CS  No Yes       ROS:  Was performed and pertinent positives and negatives are included in the history.  Exam: chaperone present  BP 118/72  Ht 5' 1.5" (1.562 m)  Wt 143 lb (64.864 kg)  BMI 26.58 kg/m2  LMP 08/10/2011  Body mass index is 26.58 kg/(m^2).  General appearance : Well developed well nourished female. No acute distress HEENT: Neck supple, trachea midline, no carotid bruits, no thyroidmegaly Lungs: Clear to auscultation, no rhonchi or wheezes, or rib retractions  Heart: Regular rate and rhythm, no murmurs or gallops Breast:Examined in sitting and supine position  were symmetrical in appearance, no palpable masses or tenderness,  no skin retraction, no nipple inversion, no nipple discharge, no skin discoloration, no axillary or supraclavicular lymphadenopathy Abdomen: no palpable masses or tenderness, no rebound or guarding Extremities: no edema or skin discoloration or tenderness  Pelvic:  Bartholin, Urethra, Skene Glands: Within normal limits             Vagina: No gross lesions or discharge  Cervix: No gross lesions or discharge  Uterus  anteverted, normal size, shape and consistency, non-tender and mobile  Adnexa  Without masses or tenderness  Anus and perineum  normal   Rectovaginal  normal sphincter tone without palpated masses or tenderness             Hemoccult not done     Assessment/Plan:  46 y.o. female for annual exam we discussed importance of her adhering to continue her statin as a result of her hyper cholesterolemia. We're going to check her fasting lipid profile today and if normal she will not need to continue medication. We'll also get a baseline SGOT SGPT today along with her TSH CBC fasting lipid profile in a fasting blood sugar. New screening guidelines her Pap smear discussed. No Pap smear done today next Pap smear June 2 years. All the above was explained to the patient Spanish all questions are answered and we'll follow accordingly.  Ok Edwards MD, 2:20 PM 09/03/2011

## 2011-09-04 ENCOUNTER — Encounter: Payer: BC Managed Care – PPO | Admitting: Gynecology

## 2011-09-04 LAB — URINALYSIS W MICROSCOPIC + REFLEX CULTURE
Bacteria, UA: NONE SEEN
Bilirubin Urine: NEGATIVE
Casts: NONE SEEN
Crystals: NONE SEEN
Glucose, UA: NEGATIVE mg/dL
Hgb urine dipstick: NEGATIVE
Ketones, ur: NEGATIVE mg/dL
Leukocytes, UA: NEGATIVE
Nitrite: NEGATIVE
Protein, ur: NEGATIVE mg/dL
Specific Gravity, Urine: 1.023 (ref 1.005–1.030)
Squamous Epithelial / LPF: NONE SEEN
Urobilinogen, UA: 0.2 mg/dL (ref 0.0–1.0)
pH: 6 (ref 5.0–8.0)

## 2011-09-10 ENCOUNTER — Other Ambulatory Visit: Payer: Self-pay | Admitting: Anesthesiology

## 2011-09-10 DIAGNOSIS — E782 Mixed hyperlipidemia: Secondary | ICD-10-CM

## 2011-09-11 ENCOUNTER — Other Ambulatory Visit: Payer: BC Managed Care – PPO

## 2011-09-11 DIAGNOSIS — E782 Mixed hyperlipidemia: Secondary | ICD-10-CM

## 2011-09-11 LAB — LIPID PANEL
Cholesterol: 153 mg/dL (ref 0–200)
HDL: 36 mg/dL — ABNORMAL LOW (ref >39)
LDL Cholesterol: 57 mg/dL (ref 0–99)
Total CHOL/HDL Ratio: 4.3 Ratio
Triglycerides: 302 mg/dL — ABNORMAL HIGH (ref <150)
VLDL: 60 mg/dL — ABNORMAL HIGH (ref 0–40)

## 2011-09-14 ENCOUNTER — Other Ambulatory Visit: Payer: Self-pay | Admitting: Anesthesiology

## 2011-09-14 DIAGNOSIS — E7800 Pure hypercholesterolemia, unspecified: Secondary | ICD-10-CM

## 2011-09-14 DIAGNOSIS — Z79899 Other long term (current) drug therapy: Secondary | ICD-10-CM

## 2011-09-15 ENCOUNTER — Encounter (INDEPENDENT_AMBULATORY_CARE_PROVIDER_SITE_OTHER): Payer: Self-pay | Admitting: General Surgery

## 2011-09-15 ENCOUNTER — Ambulatory Visit (INDEPENDENT_AMBULATORY_CARE_PROVIDER_SITE_OTHER): Payer: BC Managed Care – PPO | Admitting: General Surgery

## 2011-09-15 VITALS — BP 112/60 | HR 78 | Resp 16 | Ht 62.0 in | Wt 148.0 lb

## 2011-09-15 DIAGNOSIS — N611 Abscess of the breast and nipple: Secondary | ICD-10-CM

## 2011-09-15 DIAGNOSIS — N61 Mastitis without abscess: Secondary | ICD-10-CM

## 2011-09-15 MED ORDER — DOXYCYCLINE HYCLATE 100 MG PO TABS: 100.0000 mg | ORAL_TABLET | Freq: Two times a day (BID) | ORAL | Status: AC

## 2011-09-15 NOTE — Progress Notes (Signed)
Subjective:     Patient ID: Taylor Parker, female   DOB: 1966/04/17, 46 y.o.   MRN: [id number]  HPI This is a 46 year old female well known to me from recurrent right breast abscesses that have been very difficult to clear. I drained the last one and put her on a prolonged course of antibiotics for about 3 weeks. She did very well until about 3 days after she completed her antibiotics and now she's had some tenderness at the site of where the drainages as well as what was some drainage from one of the other sites. I'm not sure if this was purulent drainage her wishes drainage from her incisions are healing. She denies any other systemic complaints.  Review of Systems     Objective:   Physical Exam  Pulmonary/Chest:         Assessment:    Recurrent right breast abscess    Plan:     I will see anything to drink today but she certainly has symptoms again. I wanted was recommended hot packs on the upper arm a prolonged course of antibiotics as this appeared to pop right knee she completed the prior course. I will plan on seeing her back in 2 weeks or sooner if something comes up between now.

## 2011-09-22 ENCOUNTER — Ambulatory Visit (INDEPENDENT_AMBULATORY_CARE_PROVIDER_SITE_OTHER): Payer: BC Managed Care – PPO | Admitting: General Surgery

## 2011-09-22 ENCOUNTER — Encounter (INDEPENDENT_AMBULATORY_CARE_PROVIDER_SITE_OTHER): Payer: Self-pay | Admitting: General Surgery

## 2011-09-22 VITALS — BP 92/58 | HR 56 | Temp 98.0°F | Ht 62.0 in | Wt 147.6 lb

## 2011-09-22 DIAGNOSIS — N611 Abscess of the breast and nipple: Secondary | ICD-10-CM

## 2011-09-22 DIAGNOSIS — N61 Mastitis without abscess: Secondary | ICD-10-CM

## 2011-09-22 NOTE — Progress Notes (Signed)
Subjective:     Patient ID: Taylor Parker, female   DOB: 1966/01/26, 46 y.o.   MRN: [id number]  HPI 76 yof with recurrent multiple right breast abscesses.  She had erythema after stopping abx and I put her back on long course of doxy last time I saw her.  She had one area drain since I last saw her but otherwise returns doing well without complaint.  Review of Systems     Objective:   Physical Exam Right breast medial aspect of nipple areola with shallow area and pink granulation tissue, no infection present today    Assessment:     Recurrent right breast abscesses    Plan:     Continue doxy until done Will return four weeks and I will send for repeat mmg

## 2011-10-19 ENCOUNTER — Ambulatory Visit (INDEPENDENT_AMBULATORY_CARE_PROVIDER_SITE_OTHER): Payer: BC Managed Care – PPO | Admitting: General Surgery

## 2011-10-19 ENCOUNTER — Other Ambulatory Visit (INDEPENDENT_AMBULATORY_CARE_PROVIDER_SITE_OTHER): Payer: Self-pay | Admitting: General Surgery

## 2011-10-19 ENCOUNTER — Encounter (INDEPENDENT_AMBULATORY_CARE_PROVIDER_SITE_OTHER): Payer: Self-pay | Admitting: General Surgery

## 2011-10-19 VITALS — BP 116/68 | HR 64 | Temp 97.6°F | Resp 18 | Ht 62.0 in | Wt 144.2 lb

## 2011-10-19 DIAGNOSIS — N63 Unspecified lump in unspecified breast: Secondary | ICD-10-CM

## 2011-10-19 DIAGNOSIS — N61 Mastitis without abscess: Secondary | ICD-10-CM

## 2011-10-19 DIAGNOSIS — N611 Abscess of the breast and nipple: Secondary | ICD-10-CM

## 2011-10-19 NOTE — Progress Notes (Signed)
Subjective:     Patient ID: Taylor Parker, female   DOB: 03/27/1966, 46 y.o.   MRN: [id number]  HPI  This is a 46 year old  female I know well from recurrent right breast abscess. She has done well for the last month on a prolonged course of antibiotics and returns today without any complaints at all. Review of Systems     Objective:   Physical Exam Right breast with multiple well healed incisions without infection    Assessment:     Recurrent right breast abscess    Plan:     Will send to get mmg now F/U after mmg

## 2011-10-21 ENCOUNTER — Telehealth (INDEPENDENT_AMBULATORY_CARE_PROVIDER_SITE_OTHER): Payer: Self-pay | Admitting: General Surgery

## 2011-10-21 DIAGNOSIS — N63 Unspecified lump in unspecified breast: Secondary | ICD-10-CM

## 2011-10-21 NOTE — Telephone Encounter (Signed)
This came to by basket, looks like a [facility] patient. Came from [facility]. FYI.

## 2011-10-21 NOTE — Telephone Encounter (Signed)
[facility] calling to ask about ordering the bilateral mgm. I fixed the order in epic for pt to get diag. Bilateral mgm.

## 2011-10-22 ENCOUNTER — Ambulatory Visit
Admission: RE | Admit: 2011-10-22 | Discharge: 2011-10-22 | Disposition: A | Discharge status: Home / Self Care | Payer: BC Managed Care – PPO | Source: Ambulatory Visit | Attending: General Surgery | Admitting: General Surgery

## 2011-10-22 DIAGNOSIS — N63 Unspecified lump in unspecified breast: Secondary | ICD-10-CM

## 2011-10-22 NOTE — Progress Notes (Signed)
Interpreter Wyvonnia Dusky for [employer]

## 2011-11-20 ENCOUNTER — Encounter (INDEPENDENT_AMBULATORY_CARE_PROVIDER_SITE_OTHER): Payer: BC Managed Care – PPO | Admitting: General Surgery

## 2011-11-30 ENCOUNTER — Encounter (INDEPENDENT_AMBULATORY_CARE_PROVIDER_SITE_OTHER): Payer: Self-pay | Admitting: General Surgery

## 2011-12-14 ENCOUNTER — Encounter (INDEPENDENT_AMBULATORY_CARE_PROVIDER_SITE_OTHER): Payer: Self-pay | Admitting: General Surgery

## 2012-01-19 ENCOUNTER — Ambulatory Visit (INDEPENDENT_AMBULATORY_CARE_PROVIDER_SITE_OTHER): Payer: BC Managed Care – PPO | Admitting: General Surgery

## 2012-01-19 ENCOUNTER — Encounter (INDEPENDENT_AMBULATORY_CARE_PROVIDER_SITE_OTHER): Payer: Self-pay | Admitting: General Surgery

## 2012-01-19 VITALS — BP 102/64 | HR 57 | Temp 97.4°F | Ht 62.0 in | Wt 144.4 lb

## 2012-01-19 DIAGNOSIS — N611 Abscess of the breast and nipple: Secondary | ICD-10-CM

## 2012-01-19 DIAGNOSIS — N61 Mastitis without abscess: Secondary | ICD-10-CM

## 2012-01-19 NOTE — Patient Instructions (Signed)
Auto examen de mama (Breast Self-Exam) El auto examen puede ayudarla a encontrar modificaciones o trastornos en la mama cuando todava son pequeos. Haga el auto examen de la mama:  [employer].   Una semana despus de su perodo (ciclo menstrual o periodo menstrual).   El Social worker de cada mes, si ya no tiene el perodo.  Debe estar atenta a:  Cambios en el color, tamao o forma de la mama.   Hoyuelos en los senos.   Modificaciones en los pezones o la piel.   Sequedad en la piel de los senos o los pezones.   Secreciones acuosas o sanguinolentas en los pezones.  Trate de sentir la presencia de:  Bultos.   Durezas.   Cualquier otro cambio.  CUIDADOS EN EL HOGAR  Hay 3 formas de hacer el examen autoexamen de mama: Prese frente a un espejo.  Levante los brazos por arriba de la cabeza y gire de un lado al otro.   Coloque las [employer] caderas e inclnese hacia abajo y luego gire de un lado al otro.   Inclnese hacia delante y gire de un lado al otro.  En la ducha.  Con las manos enjabonadas, revise los [employer]. Luego controle por [place] y por debajo de la clavcula y las [place].   Pase los dedos por la zona superior e inferior de la clavcula hasta debajo del seno, y desde el centro del pecho hasta el borde exterior de Management consultant. Controle si hay bultos o zonas duras.   Utilizando las yemas de tres dedos del medio revise todo su seno presionando la mano sobre el [place], haciendo crculos o movimientos hacia arriba y [place].  Acostada.  Acustese sobre su cama.   Coloque una almohada pequea debajo de la mama que va a Chief Operating Officer. En esa misma posicin, ponga la mano detrs de la cabeza.   Con la Yahoo, use los 3 dedos del medio para Public house manager.   Mueva los dedos en un crculo alrededor de la mama. Presione firmemente sobre todas las partes de la mama para Engineer, manufacturing bultos.  SOLICITE AYUDA DE INMEDIATO SI: Encuentra algn cambio para que puedan  realizarle un estudio. Document Released: 08/01/2010 Document Revised: 06/18/2011 [facility] Patient Information 2012 [place], [place].

## 2012-01-19 NOTE — Progress Notes (Signed)
Subjective:     Patient ID: Taylor Parker, female   DOB: [birth year], 46 y.o.   MRN: [id number]  HPI 24 yof with multiple recurrent right breast abscesses which finally have resolved. She has not had any complaints since our last visit.  No infection, no drainage.  Her last mmg is below.  I did biopsy at time of surgery as well and I do no think this is related to anything but an infection.   Review of Systems DIGITAL DIAGNOSTIC BILATERAL MAMMOGRAM WITH CAD  Comparison: 04/15/2011, 02/20/2011, 10/06/2010, 08/18/2009.  Findings: The breast parenchyma is heterogeneously dense  bilaterally. The right breast is smaller than the left and there  are postsurgical changes in the subareolar right breast. A ribbon  shaped biopsy clip is present the slightly outer subareolar right  breast. No mass, nonsurgical distortion, or suspicious  microcalcification is identified in either breast to suggest  malignancy.  Mammographic images were processed with CAD.  IMPRESSION:  No evidence of malignancy in either breast. Postsurgical post-  biopsy changes of the right breast. Bilateral screening mammogram  in 1 year is recommended.     Objective:   Physical Exam Right breast with healed scars and no masses    Assessment:     Recurrent right breast abscesses now resolved    Plan:     These have finally resolved and I think I can see her as needed We discussed routine screening for cancer including mmg annually, clinical exam annually and monthly sbe.

## 2012-02-22 ENCOUNTER — Encounter (INDEPENDENT_AMBULATORY_CARE_PROVIDER_SITE_OTHER): Payer: Self-pay | Admitting: General Surgery

## 2012-02-22 ENCOUNTER — Ambulatory Visit (INDEPENDENT_AMBULATORY_CARE_PROVIDER_SITE_OTHER): Payer: BC Managed Care – PPO | Admitting: General Surgery

## 2012-02-22 DIAGNOSIS — N61 Mastitis without abscess: Secondary | ICD-10-CM

## 2012-02-22 DIAGNOSIS — N6452 Nipple discharge: Secondary | ICD-10-CM

## 2012-02-22 MED ORDER — DOXYCYCLINE HYCLATE 100 MG PO TABS: 100.0000 mg | ORAL_TABLET | Freq: Two times a day (BID) | ORAL | Status: DC

## 2012-02-22 NOTE — Progress Notes (Signed)
Subjective:     Patient ID: Taylor Parker, female   DOB: 1965-08-17, 46 y.o.   MRN: [id number]  HPI 23 yof who had chronic left breast abscesses mostly subareolar in nature that were eventually cleared up with multiple rounds of abx, multiple i/d and a ductal excision.  She underwent normal mmg in April.  This all healed.  She returns today having recently developed bilateral whitish nipple discharge that has been spontaneous. The left is more clear and the right appears more milky or purulent.  She denies fevers or any other complaints.  She speaks spanish and a family member translated today.  Review of Systems     Objective:   Physical Exam Bilateral nipple discharge with right side appearing infectious possibly or milky from a single duct, the left side has single central duct clear discharge.  There are no masses bilaterally.  There is no erythema.    Assessment:     Bilateral nipple discharge, possible infection right breast infection    Plan:     I am going to try and treat with a round of doxy.  I will see again next week.  She does not have a breast cancer in the past and I don't think this is what is going on.  If she continues to have discharge or issues then will send for subareolar u/s next week.

## 2012-02-29 ENCOUNTER — Encounter (INDEPENDENT_AMBULATORY_CARE_PROVIDER_SITE_OTHER): Payer: BC Managed Care – PPO | Admitting: General Surgery

## 2012-02-29 ENCOUNTER — Telehealth (INDEPENDENT_AMBULATORY_CARE_PROVIDER_SITE_OTHER): Payer: Self-pay

## 2012-02-29 ENCOUNTER — Encounter (INDEPENDENT_AMBULATORY_CARE_PROVIDER_SITE_OTHER): Payer: Self-pay | Admitting: General Surgery

## 2012-02-29 ENCOUNTER — Ambulatory Visit (INDEPENDENT_AMBULATORY_CARE_PROVIDER_SITE_OTHER): Payer: BC Managed Care – PPO | Admitting: General Surgery

## 2012-02-29 VITALS — BP 100/62 | HR 74 | Resp 16 | Ht 62.0 in | Wt 144.0 lb

## 2012-02-29 DIAGNOSIS — N6452 Nipple discharge: Secondary | ICD-10-CM

## 2012-02-29 DIAGNOSIS — N6459 Other signs and symptoms in breast: Secondary | ICD-10-CM

## 2012-02-29 NOTE — Telephone Encounter (Signed)
LMOM for pt giving her the appt with [facility] on 03/04/12 arrive at 8:15 for 8:30. I adv. Pt to bring an interpreter with her to the appt.

## 2012-02-29 NOTE — Progress Notes (Signed)
Subjective:     Patient ID: Taylor Parker, female   DOB: 1965/11/17, 46 y.o.   MRN: [id number]  HPI 46 year old female who I know well from prior right breast abscesses. I saw her last week with bilateral spontaneous nipple discharge. The right side looked to possibly be infectious. A shorter on antibiotics and she returns today doing much better. She really has minimal discharge of the right side and this is not nearly as tender. She has a baseline tenderness of her nipple at bedside after prior surgeries. On the left side she still has some occasional spontaneous clear to yellowish discharge.  Review of Systems     Objective:   Physical Exam Her right breast is well-healed scars. This is nontender today. I cannot express any discharge and there is no evidence of any active infection. On the left side she still has some clear discharge coming from a central duct. There is no mass.    Assessment:     Bilateral nipple discharge    Plan:     She is much better since our last visit. I think the right side may have been an infectious etiology and decide really has no discharge now. It is still somewhat tender though. The left side still has the discharge. Due to the fact that she has new symptoms I am going to send her for a new diagnostic mammogram as well as to try to do a subareolar ultrasound on both sides. I will see her back in 2 weeks.

## 2012-03-01 ENCOUNTER — Other Ambulatory Visit: Payer: BC Managed Care – PPO

## 2012-03-04 ENCOUNTER — Other Ambulatory Visit (INDEPENDENT_AMBULATORY_CARE_PROVIDER_SITE_OTHER): Payer: Self-pay | Admitting: General Surgery

## 2012-03-04 ENCOUNTER — Ambulatory Visit
Admission: RE | Admit: 2012-03-04 | Discharge: 2012-03-04 | Disposition: A | Discharge status: Home / Self Care | Payer: BC Managed Care – PPO | Source: Ambulatory Visit | Attending: General Surgery | Admitting: General Surgery

## 2012-03-04 DIAGNOSIS — N6452 Nipple discharge: Secondary | ICD-10-CM

## 2012-03-17 ENCOUNTER — Ambulatory Visit (INDEPENDENT_AMBULATORY_CARE_PROVIDER_SITE_OTHER): Payer: BC Managed Care – PPO | Admitting: General Surgery

## 2012-03-17 ENCOUNTER — Encounter (INDEPENDENT_AMBULATORY_CARE_PROVIDER_SITE_OTHER): Payer: Self-pay | Admitting: General Surgery

## 2012-03-17 VITALS — BP 100/52 | HR 64 | Temp 97.8°F | Resp 16 | Ht 62.0 in | Wt 140.2 lb

## 2012-03-17 DIAGNOSIS — N6452 Nipple discharge: Secondary | ICD-10-CM

## 2012-03-17 DIAGNOSIS — N6459 Other signs and symptoms in breast: Secondary | ICD-10-CM

## 2012-03-17 NOTE — Progress Notes (Signed)
Subjective:     Patient ID: Taylor Parker, female   DOB: 09-10-1965, 46 y.o.   MRN: [id number]  HPI This is a 46 year old female who I know very well who presents after having some discharge from both nipples. One of these was possibly infection. I treated her with some antibiotics and she returns today with absolutely no symptoms. She has no discharge, tenderness, or masses. She underwent an ultrasound and a mammogram recently which showed what looked to be some debris on the left side and she is due to get a followup left breast ultrasound in 6 weeks.  Review of Systems DIGITAL DIAGNOSTIC BILATERAL MAMMOGRAM WITH CAD AND BILATERAL  BREAST ULTRASOUND:  Comparison: 10/22/2011 and earlier  Findings: Breast parenchyma is heterogeneously dense. No  suspicious mass, distortion, or microcalcifications are identified  to suggest presence of malignancy. Postoperative changes are seen  in the central portion of the right breast.  Mammographic images were processed with CAD.  On physical exam, I palpate no circumareolar retroareolar mass in  either breast. On the left, I am able to express a small amount of  clear nipple discharge from multiple ducts.  Ultrasound is performed, showing no dilated ducts or solid mass in  the retroareolar region of the right breast. Note is made of a 7  mm simple cyst in the 7 o'clock location 1 cm from the nipple.  On the left, there is mild duct ectasia in the 6 o'clock location.  Multiple ducts containing hypoechoic material, favored to represent  debris rather than masses as nine views show blood flow on Doppler  evaluation. The patient denies spontaneous nipple discharge.  IMPRESSION:  1.  1. No evidence for malignancy.  2. Intraductal filling defects on the left are favored to  represent debris rather than masses such as intraductal papillomas.  3. Clinically, the patient reports improvement following  initiation of antibiotic therapy.    RECOMMENDATION:  Follow-up left breast ultrasound to evaluate intraductal filling  defects/debris in 6 weeks.     Objective:   Physical Exam Right breast with well-healed incision, no masses, no infection, no discharge Left breast with no masses, no discharge    Assessment:     History of right breast infection and bilateral nipple discharge    Plan:     I think right now I will  just continue to follow her. There is no evidence that it is related any breast cancer. She will get a followup ultrasound in 6 weeks is recommended I will see her back after that. This was all discussed through the interpreter today.

## 2012-03-18 ENCOUNTER — Encounter (INDEPENDENT_AMBULATORY_CARE_PROVIDER_SITE_OTHER): Payer: BC Managed Care – PPO | Admitting: General Surgery

## 2012-04-15 ENCOUNTER — Inpatient Hospital Stay: Admission: RE | Admit: 2012-04-15 | Payer: BC Managed Care – PPO | Source: Ambulatory Visit

## 2012-05-16 ENCOUNTER — Encounter (INDEPENDENT_AMBULATORY_CARE_PROVIDER_SITE_OTHER): Payer: BC Managed Care – PPO | Admitting: General Surgery

## 2012-06-13 ENCOUNTER — Ambulatory Visit (INDEPENDENT_AMBULATORY_CARE_PROVIDER_SITE_OTHER): Payer: BC Managed Care – PPO | Admitting: General Surgery

## 2012-06-13 ENCOUNTER — Encounter (INDEPENDENT_AMBULATORY_CARE_PROVIDER_SITE_OTHER): Payer: Self-pay | Admitting: General Surgery

## 2012-06-13 VITALS — BP 98/66 | HR 72 | Temp 99.3°F | Resp 16 | Ht 63.0 in | Wt 144.4 lb

## 2012-06-13 DIAGNOSIS — N6452 Nipple discharge: Secondary | ICD-10-CM

## 2012-06-13 DIAGNOSIS — N6459 Other signs and symptoms in breast: Secondary | ICD-10-CM

## 2012-06-13 NOTE — Progress Notes (Signed)
Subjective:     Patient ID: Taylor Parker, female   DOB: 26-Feb-1966, 46 y.o.   MRN: [id number]  HPI This is a 4 yof I know well from right breast abscesses that have been difficult to treat.  She is actually doing very well now and has no complaints with either breast.  She has no more discharge from the left either.  She was due to get follow up u/s as listed below but did not get this done.    Review of Systems DIGITAL DIAGNOSTIC BILATERAL MAMMOGRAM WITH CAD AND BILATERAL  BREAST ULTRASOUND:  Comparison: 10/22/2011 and earlier  Findings: Breast parenchyma is heterogeneously dense. No  suspicious mass, distortion, or microcalcifications are identified  to suggest presence of malignancy. Postoperative changes are seen  in the central portion of the right breast.  Mammographic images were processed with CAD.  On physical exam, I palpate no circumareolar retroareolar mass in  either breast. On the left, I am able to express a small amount of  clear nipple discharge from multiple ducts.  Ultrasound is performed, showing no dilated ducts or solid mass in  the retroareolar region of the right breast. Note is made of a 7  mm simple cyst in the 7 o'clock location 1 cm from the nipple.  On the left, there is mild duct ectasia in the 6 o'clock location.  Multiple ducts containing hypoechoic material, favored to represent  debris rather than masses as nine views show blood flow on Doppler  evaluation. The patient denies spontaneous nipple discharge.  IMPRESSION:  1.  1. No evidence for malignancy.  2. Intraductal filling defects on the left are favored to  represent debris rather than masses such as intraductal papillomas.  3. Clinically, the patient reports improvement following  initiation of antibiotic therapy.  RECOMMENDATION:  Follow-up left breast ultrasound to evaluate intraductal filling  defects/debris in 6 weeks.  BI-RADS CATEGORY 3: Probably benign finding(s) - short  interval  follow-up suggested.     Objective:   Physical Exam  Vitals reviewed. Constitutional: She appears well-developed and well-nourished.  Pulmonary/Chest: Right breast exhibits no inverted nipple, no mass, no nipple discharge and no tenderness. Left breast exhibits no inverted nipple, no mass, no nipple discharge and no tenderness.        fibrocystic changes bilateral breasts Assessment:     History breast abscess, nipple discharge    Plan:     I think she is well now.  She does need follow up u/s as recommended but I don't think there is anything else needed after that except return to normal breast cancer screening.  She has a difficult exam now due to scarring on right but I have done biopsies, multiple radiologic studies with no evidence of cancer just consistent with infection.  I will follow up with her after u/s

## 2012-06-15 ENCOUNTER — Ambulatory Visit
Admission: RE | Admit: 2012-06-15 | Discharge: 2012-06-15 | Disposition: A | Discharge status: Home / Self Care | Payer: BC Managed Care – PPO | Source: Ambulatory Visit | Attending: General Surgery | Admitting: General Surgery

## 2012-06-15 DIAGNOSIS — N6452 Nipple discharge: Secondary | ICD-10-CM

## 2012-06-23 ENCOUNTER — Telehealth (INDEPENDENT_AMBULATORY_CARE_PROVIDER_SITE_OTHER): Payer: Self-pay | Admitting: General Surgery

## 2012-06-23 NOTE — Telephone Encounter (Signed)
Message copied by Liliana Cline on Thu Jun 23, 2012  4:32 PM ------      Message from: [place], [place]      Created: Thu Jun 23, 2012 10:36 AM       This is fine.  I would be happy to see her if she wants after mmg in august 2014 or sooner if needed but as long as all is well she could just see her gyn      ----- Message -----         From: Rad Results In Interface         Sent: 06/15/2012   4:31 PM           To: Emelia Loron, MD

## 2012-06-23 NOTE — Telephone Encounter (Signed)
Patient made aware. She will call us if she would like to follow up with [place].

## 2013-03-01 ENCOUNTER — Other Ambulatory Visit: Payer: Self-pay

## 2013-03-01 DIAGNOSIS — Z1231 Encounter for screening mammogram for malignant neoplasm of breast: Secondary | ICD-10-CM

## 2013-03-29 ENCOUNTER — Ambulatory Visit
Admission: RE | Admit: 2013-03-29 | Discharge: 2013-03-29 | Disposition: A | Discharge status: Home / Self Care | Payer: Self-pay | Source: Ambulatory Visit

## 2013-03-29 DIAGNOSIS — Z1231 Encounter for screening mammogram for malignant neoplasm of breast: Secondary | ICD-10-CM

## 2013-05-08 ENCOUNTER — Ambulatory Visit (INDEPENDENT_AMBULATORY_CARE_PROVIDER_SITE_OTHER): Payer: BC Managed Care – PPO | Admitting: Gynecology

## 2013-05-08 ENCOUNTER — Encounter: Payer: Self-pay | Admitting: Gynecology

## 2013-05-08 VITALS — BP 118/76 | Ht 61.5 in | Wt 146.0 lb

## 2013-05-08 DIAGNOSIS — Z23 Encounter for immunization: Secondary | ICD-10-CM

## 2013-05-08 DIAGNOSIS — F32A Depression, unspecified: Secondary | ICD-10-CM

## 2013-05-08 DIAGNOSIS — F329 Major depressive disorder, single episode, unspecified: Secondary | ICD-10-CM

## 2013-05-08 DIAGNOSIS — Z01419 Encounter for gynecological examination (general) (routine) without abnormal findings: Secondary | ICD-10-CM

## 2013-05-08 DIAGNOSIS — R635 Abnormal weight gain: Secondary | ICD-10-CM

## 2013-05-08 DIAGNOSIS — R32 Unspecified urinary incontinence: Secondary | ICD-10-CM | POA: Insufficient documentation

## 2013-05-08 DIAGNOSIS — Z1159 Encounter for screening for other viral diseases: Secondary | ICD-10-CM

## 2013-05-08 LAB — CBC WITH DIFFERENTIAL/PLATELET
Basophils Absolute: 0.1 10*3/uL (ref 0.0–0.1)
Basophils Relative: 1 % (ref 0–1)
Eosinophils Absolute: 0 10*3/uL (ref 0.0–0.7)
Eosinophils Relative: 1 % (ref 0–5)
HCT: 38.6 % (ref 36.0–46.0)
Hemoglobin: 13.3 g/dL (ref 12.0–15.0)
Lymphocytes Relative: 34 % (ref 12–46)
Lymphs Abs: 1.6 10*3/uL (ref 0.7–4.0)
MCH: 29.5 pg (ref 26.0–34.0)
MCHC: 34.5 g/dL (ref 30.0–36.0)
MCV: 85.6 fL (ref 78.0–100.0)
Monocytes Absolute: 0.4 10*3/uL (ref 0.1–1.0)
Monocytes Relative: 9 % (ref 3–12)
Neutro Abs: 2.5 10*3/uL (ref 1.7–7.7)
Neutrophils Relative %: 55 % (ref 43–77)
Platelets: 253 10*3/uL (ref 150–400)
RBC: 4.51 MIL/uL (ref 3.87–5.11)
RDW: 15.4 % (ref 11.5–15.5)
WBC: 4.6 10*3/uL (ref 4.0–10.5)

## 2013-05-08 MED ORDER — DESVENLAFAXINE SUCCINATE ER 50 MG PO TB24: 50.0000 mg | ORAL_TABLET | Freq: Every day | ORAL | Status: DC

## 2013-05-08 MED ORDER — NYSTATIN-TRIAMCINOLONE 100000-0.1 UNIT/GM-% EX CREA: TOPICAL_CREAM | Freq: Three times a day (TID) | CUTANEOUS | Status: DC

## 2013-05-08 NOTE — Patient Instructions (Addendum)
Depresin en el adulto  (Depression, Adult) La depresin es un sentimiento de tristeza, decaimiento, sufrimiento espiritual o vaco. Hay dos tipos de depresin:  1. La depresin que todos experimentamos de tanto en tanto debido a experiencias de la vida inquietantes, como la prdida del [place] o el final de una relacin (tristeza normal o duelo normal). Este tipo de depresin se considera normal, es de corta duracin y se Furniture conservator/restorer unos [address] y 2 semanas. (La depresin que se experimenta tras la prdida de un ser querido se llama duelo. El duelo en general dura ms de 2 semanas, pero normalmente mejora con el tiempo). 2. La depresin clnica, es la que dura ms que la tristeza o duelo normal o la que interfiere con su capacidad de Counselling psychologist, en el trabajo y en la escuela. Tambin interfiere en las relaciones personales. Afecta casi todos los aspectos de la vida. La depresin clnica es una enfermedad. Los sntomas de depresin pueden tener su origen en otras afecciones que no sean la tristeza y el duelo o la depresin clnica. Ejemplos de estas afecciones son:   Lilia Argue fsicas: Algunas enfermedades fsicas, incluyendo poca actividad de la glndula tiroides (hipotiroidismo), anemia grave, ciertos tipos de cncer, diabetes, convulsiones incontrolables, problemas cardacos y pulmonares, ictus y Chief Technology Officer crnico se asocian con sntomas de depresin.  Efectos secundarios de algn medicamento recetado: En International aid/development worker, ciertos tipos de medicamentos recetados pueden causar sntomas de depresin.  Abuso de sustancias: El abuso de alcohol y drogas puede causar sntomas de depresin. SNTOMAS Los sntomas de tristeza y duelo normal son:   Lolita Lenz o llorar durante perodos cortos de [place].  Falta de preocupacin por todo (apata).  Dificultad para dormir o dormir demasiado.  No poder disfrutar de las cosas que antes disfrutaba.  El deseo de estar solo todo el  tiempo (aislamiento social).  Falta de energa o motivacin.  Dificultad para concentrarse o recordar.  Cambios en el apetito o en el peso.  Inquietud o agitacin. Los sntomas de la depresin [place] son los mismos de la tristeza o duelo normal y tambin Baxter International siguientes sntomas:   Sentirse triste o llorar todo el [place].  Sentimientos de culpa o inutilidad.  Sentimientos de desesperanza o desamparo.  Pensamientos de suicidio o el deseo de daarse a s mismo (ideas suicidas).  Prdida de contacto con la realidad (sntomas psicticos). Ver o escuchar cosas que no son reales (alucinaciones)o tener creencias falsas acerca de su vida o de las personas que lo rodean (delirios y paranoia). DIAGNSTICO  El diagnstico de la depresin clnica se basa en la gravedad y duracin de los sntomas. El [employer]ot har preguntas sobre su historia clnica y [place] de sustancias para determinar si una enfermedad fsica, el uso de medicamentos recetados, o el abuso de sustancias es la causa de su depresin. Su mdico tambin puede indicar anlisis de [place].  TRATAMIENTO  Por lo general, la tristeza y el duelo normal no requieren tratamiento. Pero a veces se recetan antidepresivos durante el duelo para [employer]ak sntomas de depresin hasta que se resuelven.  El tratamiento para la depresin clnica depende de la gravedad de los sntomas, pero suele incluir antidepresivos, psicoterapia con un profesional de la salud mental o una combinacin de [place]. El [employer]ot ayudar a Chief Strategy Officer qu tratamiento es el mejor para usted.  La depresin causada por una enfermedad fsica generalmente desaparece con tratamiento mdico adecuado de la enfermedad. Si un medicamento recetado [employer]  depresin, hable con su mdico acerca de suspenderlo, disminuir la dosis o sustituirlo por otro medicamento.  La depresin causada por el abuso de alcohol o abuso de drogas ilcitas se va con la abstinencia de estas  sustancias. Algunos adultos necesitan ayuda profesional con el fin de dejar de beber o usar drogas.  SOLICITE ATENCIN MDICA DE INMEDIATO SI:   Tiene pensamientos acerca de lastimarse o daar a Economist.  Pierde el contacto con la realidad (tiene sntomas psicticos).  Est tomando medicamentos para la depresin y tiene Hospital doctor graves. Irven Shelling MS INFORMACIN National Alliance on Mental Illness: www.nami.AK Steel Holding Corporation of Mental Health: http://www.maynard.net/  Document Released: 06/29/2005 Document Revised: 12/29/2011 [facility] Patient Information 2014 [place], [place]. Vacuna antigripal (vacuna antigripal inactivada) 2013 2014, Lo que debe saber  (Influenza Vaccine [Flu Vaccine, Inactivated] 2013 2014, What You Need to Know) PORQU VACUNARSE?   La influenza ("gripe") es una enfermedad contagiosa que se propaga por los [place] en invierno, por lo general entre octubre y [place].  La causa de la gripe es el virus de la influenza, y se puede contagiar por la tos, al estornudar y por el contacto cercano.  Cualquier persona puede Writer gripe, Biomedical engineer el riesgo es mayor entre los nios. Los sntomas aparecen rpidamente y pueden durar [address]. Pueden ser:  Grant Ruts o escalofros.  Dolor de Advertising copywriter.  Dolores musculares.  La fatiga.  Tos.  Dolor de [place].  Secrecin o congestin nasal. La gripe puede hacer que algunas personas se enfermen ms que otros. Entre [employer]ney se incluyen a los nios pequeos, las Smith International de 65 aos, las mujeres embarazadas y las personas con Runner, broadcasting/film/video, como enfermedades cardacas, pulmonares o renales, o que tienen un sistema inmunolgico debilitado. La vacuna contra la gripe es especialmente importante para estas personas y para todos los que estn en estrecho contacto con ellos.  La gripe tambin puede causar neumona y Theme park manager las afecciones existentes. En los nios, puede provocar diarrea y  convulsiones.  Cada ao miles de [employer]ocker [place] debido a la gripe y muchos ms deben ser hospitalizados.  La vacuna contra la gripe es la mejor proteccin que existe contra la gripe y sus complicaciones. La vacuna contra la gripe tambin ayuda a prevenir la propagacin de la gripe de [name] persona a Educational psychologist.  VACUNA INACTIVADA CONTRA LA GRIPE  Hay dos tipos de vacunas contra la gripe:   Usted recibir la vacuna de la gripe inactivada, que no contiene virus vivo. Se administra en forma de inyeccin con Marella Bile y se llama la "vacuna antigripal".  Otro tipo de vacuna con virus vivos, atenuados (debilitados), se aplica en forma de aerosol en las fosas nasales. Esta vacuna se describe en el apartado Informacin sobre las vacunas. Se recomienda aplicarse la vacuna contra la gripe todos los [place]. Los nios [employer]ger 6 meses y los 8 aos de edad deben recibir 2 dosis Dispensing optician que se vacunen.  Los virus de la gripe [place] constantemente. Cada ao, la vacuna contra la gripe se actualiza para proteger contra los virus que tienen ms probabilidades de causar la enfermedad ese ao. Aunque la vacuna no puede prevenir todos los casos de gripe, es nuestra mejor defensa contra la enfermedad. Vacuna contra la gripe inactivada protege contra 3 o 4 virus diferentes.  Se tarda aproximadamente 2 semanas para desarrollar la proteccin despus de la vacunacin y la proteccin dura entre algunos meses y un ao.  Muchas  veces se confunden con la gripe algunas enfermedades que no son causadas por el virus de la gripe. La vacuna contra la gripe no previene estas enfermedades. Slo se puede prevenir la gripe.  Para las personas de ms de 65 aos, se dispone de una vacuna contra la gripe de "dosis elevada". La persona que aplica la vacuna puede darle ms informacin al respecto.  Algunas de las vacunas contra la gripe inactivada contienen una cantidad muy pequea de un conservante a base de mercurio  llamado timerosal. Algunos estudios han demostrado que el timerosal en las vacunas no es perjudicial, pero se dispone de vacunas contra la gripe que no contienen el conservante.  ALGUNAS PERSONAS NO DEBEN RECIBIR ESTA VACUNA Informe a la persona que le aplica la vacuna:   Si sufre alguna alergia grave (que pone en peligro la vida). Si alguna vez tuvo una reaccin alrgica potencialmente mortal despus de [name] dosis de la vacuna contra la gripe, o tuvo una alergia grave a cualquiera de los componentes de [place], es posible que se le recomiende no recibir una dosis. La Harley[employer]Davidson de las vacunas contra la gripe, aunque no todas, contienen una pequea cantidad de [place].  Si alguna vez ha sufrido el sndrome de Pension scheme manager (una enfermedad paralizante grave tambin llamada GBS). Algunas personas con antecedentes de GBS no deben recibir esta vacuna. Debe comentarlo con su mdico.  Si no se siente bien. Podran sugerirle que espere hasta sentirse mejor. Pero debe volver. RIESGOS DE UNA REACCIN A LA VACUNA Con la vacuna, como cualquier medicamento, existe la posibilidad de sufrir efectos secundarios. Suelen ser leves y desaparecen por s solos.  Los efectos secundarios graves son [place], pero son Lynnae Sandhoff raros. Vacuna de la gripe inactivada no contiene el virus vivo de la gripe, la gripe por lo tanto enfermarse por recibir la vacuna no es posible.  Episodios de desmayo leves y sntomas relacionados (tales como sacudidas) pueden presentarse despus de cualquier procedimiento mdico, incluyendo la vacunacin. Si permanece sentado o recostado durante 15 minutos despus de la vacunacin puede ayudar a Lub[employer]ol Corporation y las lesiones causadas por las cadas. Informe al mdico si se siente mareado o aturdido, tiene Allied Waste Industries visin o zumbidos en los odos.  Problemas leves luego de recibir la vacuna de la gripe inactivada:   Barista, enrojecimiento o Paramedic en el que le  aplicaron la vacuna.  Ronquera; dolor, inflamacin o picazn en los ojos o tos.  Grant Ruts.  Dolores.  Dolor de [place].  Picazn.  Fatiga. Si estos problemas ocurren, en general comienzan poco despus de vacunarse y duran 1  2 das.  Problemas moderados luego de recibir la vacuna de la gripe inactivada:   Los nios que reciben la vacuna contra la gripe inactivada y Research scientist (medical) antineumoccica (PCV13) al mismo tiempo, pueden tener un mayor riesgo de sufrir convulsiones causadas por fiebre. Consulte a su mdico para obtener ms informacin. Informe a su mdico si un nio que est recibiendo la vacuna contra la gripe ha tenido una convulsin. Problemas graves luego de recibir la vacuna inactivada contra la gripe:   [name] reaccin alrgica grave puede ocurrir despus de la administracin de cualquier vacuna (se estima en menos de 1 en un milln de dosis).  Hay una pequea posibilidad de que la vacuna de la gripe inactivada est asociada con el sndrome de Guillain-Barr (GBS), no ms de 1 o 2 casos por milln de personas vacunadas. Es [employer]GI Corporation  riesgo de sufrir complicaciones graves por la gripe, que puede prevenirse con la vacunacin. Se controla permanentemente la seguridad de las vacunas. Para obtener ms informacin, consulte [email or web address]/  QU PASA SI HAY UNA REACCIN GRAVE?  Qu signos debo buscar?   Observe todo lo que le preocupe, como signos de una reaccin alrgica grave, fiebre muy alta o cambios en el comportamiento. Los signos de Runner, broadcasting/film/video grave pueden incluir urticaria, hinchazn de la cara y la garganta, dificultad para respirar, ritmo cardaco acelerado, mareos y debilidad. Pueden comenzar entre unos pocos minutos y algunas horas despus de la vacunacin.  Qu debo hacer?   Si usted piensa que se trata de una reaccin alrgica grave o de otra emergencia que no puede esperar, llame al 911 o lleve a la persona al hospital ms cercano. De lo  contrario, llame a su mdico.  Despus, la reaccin debe informarse a la "Vaccine Adverse Event Reporting System" (Sistema de informacin sobre efectos adversos de las vacunas -VAERS). Su mdico puede presentar este informe, o puede hacerlo usted mismo a travs del sitio web de VAERS, en www.vaers.LAgents.no, o llamando al 7470636177. VAERS es slo para informar reacciones. No brindan consejo mdico.  PROGRAMA NACIONAL DE COMPENSACIN DE DAOS POR VACUNAS  El National Vaccine Injury Compensation Program (VICP) es un programa federal que fue creado para compensar a las personas que puedan haber sufrido daos al recibir ciertas vacunas.  Aquellas personas que consideren que han sufrido un dao como consecuencia de una vacuna y quieren saber ms acerca del programa y como presentar Roslynn Amble, [place] llamar al [phone] o visitar su sitio web en [email or web address].  CMO PUEDO OBTENER MS INFORMACIN?   Consulte a su mdico.  Comunquese con el servicio de salud de su localidad o [address].  Comunquese con los Centros para el control y la prevencin de Child psychotherapist for Disease Control and Prevention , CDC).  Llame al 903-636-9585 (1-800-CDC-INFO) o  Visite la pgina web de los CDC en BiotechRoom.com.cy. CDC Inactivated Influenza Vaccine Interim VIS (02/05/12)  Document Released: 09/25/2008 Document Revised: 03/23/2012 ExitCare Patient Information 2014 [place], [place]. Vejiga neurognica  (Neurogenic Bladder)  La vejiga neurognica es la prdida del control normal de la funcin de la vejiga. La causa es un dao en los nervios, que puede ser consecuencia de distintas lesiones y [place].  Los msculos y los nervios del sistema urinario trabajan juntos para Academic librarian la orina y Customer service manager en el momento [place]. Los nervios transmiten mensajes desde la vejiga al cerebro y del cerebro a los msculos de la vejiga. En la vejiga neurognica, los nervios que llevan  estos mensajes no funcionan adecuadamente. Hay dos tipos de vejiga neurognica:   Hiperactiva.  La vejiga no puede controlar cundo o qu cantidad ConocoPhillips. Incluso cuando slo hay una pequea cantidad de [place] en la vejiga, puede haber necesidad de orinar que no puede ser Animal nutritionist. Esto puede causar en accidentes (incontinencia de urgencia).  Poco activa.  La vejiga contiene mucho ms orina de lo normal. Pequeas cantidades de orina se escapan cuando la presin de la vejiga aumenta porque no hay sensacin de que la vejiga est llena. Tambin esto puede causar accidentes. CAUSAS  Las causas de la vegina neurognica pueden ser varias. Ellas son:   Ictus.  Esclerosis mltiple.  Infecciones.  Traumatismos y lesiones en la columna vertebral que dependen del nivel de la lesin en la espalda.  Diabetes.  Enfermedad de Parkinson.  Tumores en el cerebro y la columna.  Defectos congnitos que afectan el cerebro o la mdula espinal.  Sndrome de [place].  Complicaciones de la ciruga que involucran la columna vertebral, la mdula espinal o la pelvis. SNTOMAS  Los siguientes son los problemas y los sntomas de la vejiga neurognica ms frecuentes. Sin embargo, cada individuo puede experimentarlos de Industrial/product designer.   Sntomas de infeccin del tracto urinario.  Dolor o ardor al Geographical information systems officer.  Dolor de espalda o en un flanco.  Mason Jim turbia o con sangre.  Grant Ruts.  Sntomas de clculos renales. Los sntomas de clculos renales pueden ser:  Escalofros.  Temblores.  Malestar estomacal (nuseas) o vmitos.  Grant Ruts.  Prdida del control vesical (incontinencia urinaria).  Volumen urinario pequeo al vaciar la vejiga (miccin).  Frecuencia y [place] urinaria.  Goteo de [place].  Prdida de la sensacin de tener la vejiga llena.  Insuficiencia renal.  Infeccin en el torrente sanguneo (sepsis). DIAGNSTICO  Cuando se sospecha una vejiga neurognica, se examinan tanto  el sistema nervioso como la vejiga. Adems de revisar su historial mdico completo y de Education officer, environmental un examen fsico, los procedimientos para diagnosticar la vejiga neurognica pueden incluir:   Radiografas de la columna vertebral.  Diagnstico por imgenes  de los riones, los urteres y Engineer, site. Estos estudios pueden ser:  IRM.  Tomografa computada.  Ecografas.  Urodinmica o cistometrograma  (CMG). Con este estudio se Dover Corporation nervios y los msculos de la vejiga. El estudio muestra la capacidad de la vejiga y si se vaca por completo.  EMG del esfnter. Esta es la medida de la actividad elctrica del msculo del esfnter y ayuda a Chief Strategy Officer cundo Programmer, multimedia se contrae o aprieta hacia abajo.  Estudios de video  de la vejiga y del Producer, television/film/video.  Cistoscopia. Observacin del interior de la vejiga con un instrumento similar a un telescopio. TRATAMIENTO  El tratamiento especfico para la vejiga neurognica ser determinado por el mdico basndose en:   La edad, el estado general de salud y la historia mdica.  La gravedad de los sntomas.  Las causas del dao al nervio.  El tipo de problema presente en la vejiga.  La tolerancia a determinados medicamentos, procedimientos o terapias.  Expectativas del curso de la enfermedad.  Su opinin o preferencia. El tratamiento puede incluir:   Antibiticos.  Medicamentos.  La insercin de una sonda flexible (catter) para vaciar la vejiga.  Ciruga para crear un esfnter artificial para prevenir prdidas de [place].  Estimulacin del Immunologist (SNS) para ayudar a Risk manager los nervios con el fin de vaciar la vejiga.  Ciruga para colocar un cabestrilo para contener el cuello de la vejiga y la uretra en la posicin correcta para evitar prdidas.  Aumento de la vejiga.  Ciruga del asa ileal para conectar una seccin del intestino a los urteres y Immunologist en una abertura en el abdomen. La [place] sale y se  Web designer en una bolsa de plstico.  Almohadillas perineales se pueden Chemical engineer para recoger la orina. INSTRUCCIONES PARA EL CUIDADO EN EL HOGAR   Tome los medicamentos que le indic el mdico.  Revise los medicamentos que toma (tanto los recetados como los de [place]) con su mdico para estar seguro de que no lo perjudican.  Podr ser Temple[employer]Inland haga anlisis de [place], de [place] y estudios de diagnstico por imgenes de Wellsite geologist peridica. Siga el consejo del mdico con respecto al momento en que Engineer, building services.  Siga las instrucciones para el uso del catter, si tiene [place].  Use ropa interior desechable si tiene debilidad de la vejiga. SOLICITE ATENCIN MDICA SI:   Aumenta la fatiga o debilidad.  Siente que hay cada vez menos control del flujo de [place] a pesar del [place].  Comienza a sentir prdida de apetito o nuseas.  Tiene dificultad para insertar el catter.  Su orina es oscura, turbia o tiene un olor inusual. SOLICITE ATENCIN MDICA DE INMEDIATO SI:   Siente que el dolor abdominal o de espalda empeoran.  No puede orinar.  La orina es sanguinolenta.  Siente mucho ardor al ConocoPhillips.  Tiene fiebre.  Sigue vomitando.  Tiene sangrado al insertar el catter. Document Released: 06/15/2012 [facility] Patient Information 2014 [place], [place].

## 2013-05-08 NOTE — Progress Notes (Addendum)
Taylor Parker 14-Dec-1965 [id number]   History:    47 y.o.  for annual gyn exam With several complaints today. The patient feels depressed withdrawn and feels like crying at times. She has good family support. Her son was recently deported back to [place]. Patient has no suicidal ideation. She's also states that she urinary incontinence when she coughs or sneezes and does get up several times at night to urinate. Patient last year was treated by Dr. Dwain Sarna for a right breast abscess and has done well. Her last mammogram was September 2012. She complains of occasional pruritus around the nipple and both breasts. She reports normal menstrual cycle. She had previous tubal sterilization procedure. She was weighing 143 pounds now weighing 146 pounds.. The patient has informed me today for the first time that in 2004 with her last C-section were but she had a tubal sterilization procedure that she was anticoagulated during her pregnancy and then for 6 months postpartum she was on Coumadin. We'll need to request a copy of her discharge summary for better assessment. Patient also has history of hyperlipidemia and last year she was placed on Lipitor and patient discontinued 2 months later she stated she did not like the way it made her feel.  Past medical history,surgical history, family history and social history were all reviewed and documented in the EPIC chart.  Gynecologic History Patient's last menstrual period was 05/06/2013. Contraception: tubal ligation Last Pap:  2012. Results were: normal Last mammogram: 2012. Results were: normal  Obstetric History OB History  Gravida Para Term Preterm AB SAB TAB Ectopic Multiple Living  3 3 3       3     # Outcome Date GA Lbr Len/2nd Weight Sex Delivery Anes PTL Lv  3 TRM     M CS  N Y  2 TRM     M CS  N Y  1 TRM     F CS  N Y       ROS: A ROS was performed and pertinent positives and negatives are included in the history.  GENERAL: No fevers  or chills. HEENT: No change in vision, no earache, sore throat or sinus congestion. NECK: No pain or stiffness. CARDIOVASCULAR: No chest pain or pressure. No palpitations. PULMONARY: No shortness of breath, cough or wheeze. GASTROINTESTINAL: No abdominal pain, nausea, vomiting or diarrhea, melena or bright red blood per rectum. GENITOURINARY: No urinary frequency, urgency, hesitancy or dysuria. MUSCULOSKELETAL: No joint or muscle pain, no back pain, no recent trauma. DERMATOLOGIC: No rash, no itching, no lesions. ENDOCRINE: No polyuria, polydipsia, no heat or cold intolerance. No recent change in weight. HEMATOLOGICAL: No anemia or easy bruising or bleeding. NEUROLOGIC: No headache, seizures, numbness, tingling or weakness. PSYCHIATRIC: No depression, no loss of interest in normal activity or change in sleep pattern.     Exam: chaperone present  BP 118/76  Ht 5' 1.5" (1.562 m)  Wt 146 lb (66.225 kg)  BMI 27.14 kg/m2  LMP 05/06/2013  Body mass index is 27.14 kg/(m^2).  General appearance : Well developed well nourished female. No acute distress HEENT: Neck supple, trachea midline, no carotid bruits, no thyroidmegaly Lungs: Clear to auscultation, no rhonchi or wheezes, or rib retractions  Heart: Regular rate and rhythm, no murmurs or gallops Breast:Examined in sitting and supine position were symmetrical in appearance, no palpable masses or tenderness,  no skin retraction, no nipple inversion, no nipple discharge, no skin discoloration, no axillary or supraclavicular lymphadenopathy Abdomen: no palpable masses or  tenderness, no rebound or guarding Extremities: no edema or skin discoloration or tenderness  Pelvic:  Bartholin, Urethra, Skene Glands: Within normal limits             Vagina: No gross lesions or discharge, menstrual blood  Cervix: No gross lesions or discharge  Uterus  anteverted, normal size, shape and consistency, non-tender and mobile  Adnexa  Without masses or  tenderness  Anus and perineum  normal   Rectovaginal  normal sphincter tone without palpated masses or tenderness             Hemoccult none indicated     Assessment/Plan:  47 y.o. female for annual exam would depression as a result of her sons decortication. Patient will be referred to psychologist for further assistance in therapy but we will start her on Prestiq 50 mg daily. She will return back to the office in December for followup to see she's responding to treatment. We'll also check and further evaluate for stress urinary incontinence. The following labs were work today: Fasting lipid profile, TSH, CBC, comprehensive metabolic panel, and urinalysis  .New CDC guidelines is recommending patients be tested once in her lifetime for hepatitis C antibody who were born between [birth year] through 1965. This was discussed with the patient today and has agreed to be tested today.  If her lipid profile is abnormal I'm going to refer her to an internist to discuss with her alternative types of statins. She does have family history of hyperlipidemia. She was reminded to do her monthly self breast exam. She was given a prescription by tracks cream to apply to the breast tissue area when she has parotid is when necessary. Pap smear was not done today in accordance to the new guidelines.  We're going to request for discharge summary to review to determine the reason why she was anticoagulated before and after pregnancy.  Note: This dictation was prepared with  Dragon/digital dictation along withSmart phrase technology. Any transcriptional errors that result from this process are unintentional.   Ok Edwards MD, 11:27 AM 05/08/2013

## 2013-05-09 ENCOUNTER — Encounter: Payer: Self-pay | Admitting: Gynecology

## 2013-05-09 LAB — COMPREHENSIVE METABOLIC PANEL
ALT: 13 U/L (ref 0–35)
AST: 13 U/L (ref 0–37)
Albumin: 3.9 g/dL (ref 3.5–5.2)
Alkaline Phosphatase: 55 U/L (ref 39–117)
BUN: 11 mg/dL (ref 6–23)
CO2: 25 mEq/L (ref 19–32)
Calcium: 8.2 mg/dL — ABNORMAL LOW (ref 8.4–10.5)
Chloride: 107 mEq/L (ref 96–112)
Creat: 0.72 mg/dL (ref 0.50–1.10)
Glucose, Bld: 89 mg/dL (ref 70–99)
Potassium: 3.8 mEq/L (ref 3.5–5.3)
Sodium: 138 mEq/L (ref 135–145)
Total Bilirubin: 0.5 mg/dL (ref 0.3–1.2)
Total Protein: 6.4 g/dL (ref 6.0–8.3)

## 2013-05-09 LAB — LIPID PANEL
Cholesterol: 173 mg/dL (ref 0–200)
HDL: 33 mg/dL — ABNORMAL LOW (ref >39)
Total CHOL/HDL Ratio: 5.2 Ratio
Triglycerides: 420 mg/dL — ABNORMAL HIGH (ref <150)

## 2013-05-09 LAB — HEPATITIS C ANTIBODY: HCV Ab: NEGATIVE

## 2013-05-09 LAB — TSH: TSH: 0.842 u[IU]/mL (ref 0.350–4.500)

## 2013-08-20 ENCOUNTER — Emergency Department (HOSPITAL_BASED_OUTPATIENT_CLINIC_OR_DEPARTMENT_OTHER)
Admission: EM | Admit: 2013-08-20 | Discharge: 2013-08-20 | Disposition: A | Discharge status: Home / Self Care | Payer: BC Managed Care – PPO | Attending: Emergency Medicine | Admitting: Emergency Medicine

## 2013-08-20 ENCOUNTER — Encounter (HOSPITAL_BASED_OUTPATIENT_CLINIC_OR_DEPARTMENT_OTHER): Payer: Self-pay | Admitting: Emergency Medicine

## 2013-08-20 DIAGNOSIS — R42 Dizziness and giddiness: Secondary | ICD-10-CM | POA: Insufficient documentation

## 2013-08-20 DIAGNOSIS — J208 Acute bronchitis due to other specified organisms: Secondary | ICD-10-CM

## 2013-08-20 DIAGNOSIS — I959 Hypotension, unspecified: Secondary | ICD-10-CM | POA: Insufficient documentation

## 2013-08-20 DIAGNOSIS — J209 Acute bronchitis, unspecified: Secondary | ICD-10-CM | POA: Insufficient documentation

## 2013-08-20 DIAGNOSIS — H9209 Otalgia, unspecified ear: Secondary | ICD-10-CM | POA: Insufficient documentation

## 2013-08-20 DIAGNOSIS — J029 Acute pharyngitis, unspecified: Secondary | ICD-10-CM | POA: Insufficient documentation

## 2013-08-20 DIAGNOSIS — H5789 Other specified disorders of eye and adnexa: Secondary | ICD-10-CM | POA: Insufficient documentation

## 2013-08-20 LAB — RAPID STREP SCREEN (MED CTR MEBANE ONLY): Streptococcus, Group A Screen (Direct): NEGATIVE

## 2013-08-20 MED ORDER — SODIUM CHLORIDE 0.9 % IV SOLN: Freq: Once | INTRAVENOUS | Status: DC

## 2013-08-20 MED ORDER — BENZONATATE 100 MG PO CAPS: 100.0000 mg | ORAL_CAPSULE | Freq: Three times a day (TID) | ORAL | Status: DC

## 2013-08-20 NOTE — ED Notes (Signed)
Patient here with dry cough and frontal headache x 1 day. Also reports that she has sore throat when she coughs

## 2013-08-20 NOTE — Discharge Instructions (Signed)
Be sure you are drinking at least 8 glasses of water per day to stay well hydrated. Follow up with your doctor or return here as needed for problems.

## 2013-08-20 NOTE — ED Provider Notes (Signed)
CSN: [id number]     Arrival date & time 08/20/13  1136 History   First MD Initiated Contact with Patient 08/20/13 1402     Chief Complaint  Patient presents with  . Cough  . Headache   (Consider location/radiation/quality/duration/timing/severity/associated sxs/prior Treatment) Patient is a 48 y.o. female presenting with cough and headaches. The history is provided by the patient.  Cough Cough characteristics:  Dry Severity:  Moderate Onset quality:  Gradual Duration:  2 days Timing:  Sporadic Progression:  Worsening Chronicity:  New Smoker: no   Context: sick contacts   Relieved by:  None tried Worsened by:  Nothing tried Ineffective treatments:  None tried Associated symptoms: ear pain, headaches, myalgias, sinus congestion and sore throat   Associated symptoms: no chills, no fever and no rash   Headache Associated symptoms: congestion, cough, ear pain, fatigue, myalgias, sinus pressure and sore throat   Associated symptoms: no abdominal pain, no fever, no nausea and no vomiting    Taylor Parker is a 48 y.o. female who presents to the ED with sore throat and cough that started 2 days ago. She has not taken anything OTC. She has felt a little dizzy at times when she gets up quickly but that started a couple weeks ago. She denies fever or chills.   History reviewed. No pertinent past medical history. History reviewed. No pertinent past surgical history. No family history on file. History  Substance Use Topics  . Smoking status: Never Smoker   . Smokeless tobacco: Not on file  . Alcohol Use: Not on file   OB History   Grav Para Term Preterm Abortions TAB SAB Ect Mult Living                 Review of Systems  Constitutional: Positive for fatigue. Negative for fever and chills.  HENT: Positive for congestion, ear pain, sinus pressure and sore throat.   Respiratory: Positive for cough.   Gastrointestinal: Negative for nausea, vomiting and abdominal pain.   Genitourinary: Negative for dysuria, urgency and frequency.  Musculoskeletal: Positive for myalgias.  Skin: Negative for rash.  Neurological: Positive for light-headedness and headaches.  Psychiatric/Behavioral: The patient is not nervous/anxious.     Allergies  Review of patient's allergies indicates no known allergies.  Home Medications  No current outpatient prescriptions on file. BP 95/42  Pulse 60  Temp(Src) 98.1 F (36.7 C) (Oral)  Resp 18  Wt 147 lb (66.679 kg)  SpO2 99% Physical Exam  Nursing note and vitals reviewed. Constitutional: She is oriented to person, place, and time. She appears well-developed and well-nourished.  She is hypotensive  HENT:  Head: Normocephalic and atraumatic.  Right Ear: Tympanic membrane normal.  Left Ear: Tympanic membrane normal.  Nose: Right sinus exhibits maxillary sinus tenderness. Left sinus exhibits maxillary sinus tenderness.  Mouth/Throat: Uvula is midline and mucous membranes are normal. Posterior oropharyngeal erythema present.  Mild tenderness bilateral maxillary sinuses.   Eyes: Conjunctivae and EOM are normal. Pupils are equal, round, and reactive to light. Right eye exhibits discharge. Left eye exhibits no discharge.  Neck: Normal range of motion. Neck supple.  Cardiovascular: Normal rate, regular rhythm and normal heart sounds.   Pulmonary/Chest: Effort normal. She has no wheezes. She has no rales.  Abdominal: Soft. There is no tenderness.  Musculoskeletal: Normal range of motion.  Lymphadenopathy:    She has no cervical adenopathy.  Neurological: She is alert and oriented to person, place, and time. No cranial nerve deficit.  Skin: Skin  is warm and dry.  Psychiatric: She has a normal mood and affect. Her behavior is normal.   Results for orders placed during the hospital encounter of 08/20/13 (from the past 24 hour(s))  RAPID STREP SCREEN     Status: None   Collection Time    08/20/13  1:04 PM      Result Value Range    Streptococcus, Group A Screen (Direct) NEGATIVE  NEGATIVE     ED Course: DR. Winfred Leeds in to examine the patient and discuss plan of care using language line. Patient voices understanding.   Procedures  MDM  48 y.o. female with sore throat and cough x 2 days. Negative strep screen. Will treat for viral illness. Patient agrees to drinking 8 glasses of water daily to stay well hydrated. She will follow up with her PCP or return here for worsening symptoms. I have reviewed this patient's vital signs, nurses notes, appropriate labs and discussed findings and plan of care. Patient voices understanding. Stable for discharge without further screening.    Medication List         benzonatate 100 MG capsule  Commonly known as:  TESSALON  Take 1 capsule (100 mg total) by mouth every 8 (eight) hours.            [place], [place] 08/20/13 [phone]

## 2013-08-20 NOTE — ED Provider Notes (Signed)
Medical screening examination/treatment/procedure(s) were conducted as a shared visit with non-physician practitioner(s) and myself.  I personally evaluated the patient during the encounter.  EKG Interpretation   None        Orlie Dakin, MD 08/20/13 [phone]

## 2013-08-20 NOTE — ED Provider Notes (Signed)
Space and speaks the language. History is obtained from language line using medical interpreter. Complains of sore throat and cough and slight headache for the past 4 days admits to mild lightheadedness on standing. On exam she is alert nontoxic not ill appearing. HEENT exam oropharynx minimally reddened is normal neck supple no adenopathy lungs clear auscultation heart regular rate and rhythm. Intravenous fluids often the patient which he declined. Suspect viral illness. I encouraged oral hydration to drinksix 8 ounce glasses per day of water  Orlie Dakin, MD 08/20/13 [phone]

## 2013-08-22 LAB — CULTURE, GROUP A STREP

## 2013-08-25 ENCOUNTER — Encounter: Payer: Self-pay | Admitting: Gynecology

## 2013-09-05 ENCOUNTER — Ambulatory Visit (INDEPENDENT_AMBULATORY_CARE_PROVIDER_SITE_OTHER): Payer: BC Managed Care – PPO

## 2013-09-05 ENCOUNTER — Ambulatory Visit (INDEPENDENT_AMBULATORY_CARE_PROVIDER_SITE_OTHER): Payer: BC Managed Care – PPO | Admitting: Women's Health

## 2013-09-05 ENCOUNTER — Other Ambulatory Visit: Payer: Self-pay | Admitting: Women's Health

## 2013-09-05 ENCOUNTER — Encounter: Payer: Self-pay | Admitting: Women's Health

## 2013-09-05 DIAGNOSIS — R102 Pelvic and perineal pain: Secondary | ICD-10-CM

## 2013-09-05 DIAGNOSIS — M549 Dorsalgia, unspecified: Secondary | ICD-10-CM

## 2013-09-05 DIAGNOSIS — N831 Corpus luteum cyst of ovary, unspecified side: Secondary | ICD-10-CM

## 2013-09-05 DIAGNOSIS — N949 Unspecified condition associated with female genital organs and menstrual cycle: Secondary | ICD-10-CM

## 2013-09-05 DIAGNOSIS — R350 Frequency of micturition: Secondary | ICD-10-CM

## 2013-09-05 DIAGNOSIS — Q51810 Arcuate uterus: Secondary | ICD-10-CM

## 2013-09-05 LAB — URINALYSIS W MICROSCOPIC + REFLEX CULTURE
Bilirubin Urine: NEGATIVE
Casts: NONE SEEN
Crystals: NONE SEEN
Glucose, UA: NEGATIVE mg/dL
Ketones, ur: NEGATIVE mg/dL
Leukocytes, UA: NEGATIVE
Nitrite: NEGATIVE
Protein, ur: NEGATIVE mg/dL
Specific Gravity, Urine: >1.03 — ABNORMAL HIGH (ref 1.005–1.030)
Urobilinogen, UA: 0.2 mg/dL (ref 0.0–1.0)
WBC, UA: NONE SEEN WBC/hpf (ref <3)
pH: 5.5 (ref 5.0–8.0)

## 2013-09-05 LAB — WET PREP FOR TRICH, YEAST, CLUE
Clue Cells Wet Prep HPF POC: NONE SEEN
Trich, Wet Prep: NONE SEEN
Yeast Wet Prep HPF POC: NONE SEEN

## 2013-09-05 NOTE — Progress Notes (Signed)
Patient ID: Taylor Parker, female   DOB: Jul 17, 1965, 48 y.o.   MRN: [id number] Presents with complaint of urinary frequency, stress incontinence with cough and sneeze, denies pain with urination. Has had lower abdominal discomfort for past 3 days. Regular monthly cycle for 4 days/BTL/same partner. 2 days of light brown spotting one week after finishing cycle for many months. Normal TSH 04/2013. Denies nausea, constipation or fever. Denies changes in routine or lifting.  Spanish first  language, able to understand some [place].  Exam: Appears well. Abdomen soft, no rebound or radiation of pain with exam. No CVAT. External genitalia within normal limits, speculum exam scant white brown discharge with no odor, wet prep negative. Bimanual slight tenderness on lower left quadrant, rectal exam negative. UA: Trace blood, few bacteria, negative leukocytes and no wbc's. Ultrasound: Anteverted arcuate versus subseptate uterus with tri layered endometrium. Right ovary normal. Left ovary thickwalled involuting cyst 22 x 17 x 60 mm with positive CFD on the periphery. Negative cul-de-sac. Endometrium 11 mm.  2 day light brown spotting questionable ovulation Left ovarian involuting cyst Stress incontinence  Plan: Reviewed no treatment needed for cyst, instructions in Spanish reviewed. Continue to keep menstrual record call or return if changes. Bladder irritants to avoid were reviewed.  Urine culture pending. Spanish interpreter/Blanca's number given if further questions.

## 2013-09-05 NOTE — Patient Instructions (Signed)
Quiste ovárico  (Ovarian Cyst)  Un quiste ovárico es un saco lleno de líquido o sangre. Este saco está unido al ovario. Algunos quistes desaparecen solos. Otros quistes necesitan tratamiento.   CUIDADOS EN EL HOGAR   · Solo tome los medicamentos que le haya indicado su médico.  · Concurra a las consultas de control con el médico, según las indicaciones.  · Hágase exámenes pélvicos regulares y pruebas de Papanicolaou.  SOLICITE AYUDA SI:  · Sus períodos se atrasan o son irregulares o dolorosos.  · Sus períodos cesan.  · Siente dolor en el vientre (abdominal) o en la pelvis que no desaparece.  · El abdomen se agranda o se inflama hincha.  · Tiene dificultades para orinar (vaciar por completo la vejiga).  · Siente presión en la vejiga.  · Siente dolor durante las relaciones sexuales.  · Siente distensión, presión o molestias en el estómago.  · Pierde peso sin ningún motivo.  · Se siente mal constantemente.  · Tiene dificultades para defecar (estreñimiento).  · No tiene ganas de comer.  · Le aparecen granos (acné).  · Nota un aumento del vello corporal y facial.  · Sube de peso sin motivo.  · Sospecha que está embarazada.  SOLICITE AYUDA DE INMEDIATO SI:   · El dolor en el vientre empeora.  · Tiene malestar estomacal (náuseas) y vomita.  · Le sube la fiebre rápidamente.  · Le duele el estómago mientras defeca.  · Los períodos menstruales son más abundantes de lo habitual.  ASEGÚRESE DE QUE:   · Comprende estas instrucciones.  · Controlará su afección.  · Recibirá ayuda de inmediato si no mejora o si empeora.  Document Released: 04/19/2013  [employer]® Patient Information ©2014 [employer].

## 2013-09-06 LAB — URINE CULTURE
Colony Count: NO GROWTH
Organism ID, Bacteria: NO GROWTH

## 2013-10-11 ENCOUNTER — Ambulatory Visit (INDEPENDENT_AMBULATORY_CARE_PROVIDER_SITE_OTHER): Payer: BC Managed Care – PPO | Admitting: General Surgery

## 2013-10-11 ENCOUNTER — Encounter (INDEPENDENT_AMBULATORY_CARE_PROVIDER_SITE_OTHER): Payer: Self-pay | Admitting: General Surgery

## 2013-10-11 VITALS — BP 132/70 | HR 75 | Temp 97.6°F | Ht 62.0 in | Wt 152.4 lb

## 2013-10-11 DIAGNOSIS — N61 Mastitis without abscess: Secondary | ICD-10-CM

## 2013-10-11 MED ORDER — DOXYCYCLINE HYCLATE 50 MG PO CAPS: 50.0000 mg | ORAL_CAPSULE | Freq: Two times a day (BID) | ORAL | Status: DC

## 2013-10-11 NOTE — Progress Notes (Signed)
Subjective:     Patient ID: Taylor Parker, female   DOB: Dec 28, 1965, 48 y.o.   MRN: [id number]  HPI The patient is a 48 year old female who has a previous history of multiple abscesses to the right breast that required multiple I&D's and drainage. The patient has been has been treated by Dr. Donne Hazel in the past for these infections.  The patient states that this episode began approximately a day ago with some erythema and redness to the medial aspect of her left breast at approximately 9:00. She states that there has been some purulence that was expressed from the nipple. She has tenderness to touch in the area. She denies any fevers at home.  Review of Systems  Constitutional: Negative.   HENT: Negative.   Respiratory: Negative.   Cardiovascular: Negative.   Gastrointestinal: Negative.   Neurological: Negative.   All other systems reviewed and are negative.       Objective:   Physical Exam  Constitutional: She is oriented to person, place, and time. She appears well-developed and well-nourished.  HENT:  Head: Normocephalic and atraumatic.  Eyes: Conjunctivae and EOM are normal. Pupils are equal, round, and reactive to light.  Neck: Normal range of motion. Neck supple.  Cardiovascular: Normal rate, regular rhythm and normal heart sounds.   Pulmonary/Chest: Effort normal and breath sounds normal.    A small area of erythema and induration  Abdominal: Soft. Bowel sounds are normal. She exhibits no distension and no mass. There is no tenderness. There is no rebound and no guarding.  Musculoskeletal: Normal range of motion.  Neurological: She is alert and oriented to person, place, and time.  Skin: Skin is warm and dry.  Psychiatric: She has a normal mood and affect.       Assessment:     48 year old female with left breast cellulitis on the medial aspect of her breast at approximately 9:00     Plan:     1. I proceeded to numb up the area over the induration local  anesthesia and aspirated with an 18-gauge needle however there was no pus that could be expressed. The I believe this is mainly just cellulitis at this time.   Under the patient a prescription for doxycycline at this time. 2. We'll have the patient come back in one to 2 weeks to see Dr. Donne Hazel for followup.

## 2013-10-25 ENCOUNTER — Encounter (INDEPENDENT_AMBULATORY_CARE_PROVIDER_SITE_OTHER): Payer: BC Managed Care – PPO | Admitting: General Surgery

## 2013-11-09 ENCOUNTER — Ambulatory Visit (INDEPENDENT_AMBULATORY_CARE_PROVIDER_SITE_OTHER): Payer: BC Managed Care – PPO | Admitting: General Surgery

## 2013-11-09 ENCOUNTER — Encounter (INDEPENDENT_AMBULATORY_CARE_PROVIDER_SITE_OTHER): Payer: Self-pay | Admitting: General Surgery

## 2013-11-09 VITALS — BP 124/78 | HR 67 | Temp 98.2°F | Ht 62.0 in | Wt 152.6 lb

## 2013-11-09 DIAGNOSIS — N61 Mastitis without abscess: Secondary | ICD-10-CM

## 2013-11-09 NOTE — Progress Notes (Signed)
Subjective:     Patient ID: Taylor Parker, female   DOB: 1966/02/15, 48 y.o.   MRN: [id number]  HPI 48 yof well known to me from right breast abscess that was difficult. She was recently seen in our urgent office by my partner Dr Rosendo Gros for a cellulitis (? Abscess).  This has resolved with antibiotics completely and she returns today without any complaints.  Review of Systems     Objective:   Physical Exam  Constitutional: She appears well-developed and well-nourished.  Pulmonary/Chest: Right breast exhibits no inverted nipple, no mass, no nipple discharge, no skin change and no tenderness. Left breast exhibits no inverted nipple, no mass, no nipple discharge, no skin change and no tenderness.         Assessment:     Resolved left breast cellulitis vs abscess    Plan:     This is resolved fairly quickly with antibiotics. She currently has no symptoms. Her exam is benign today. We discussed just continuing to follow her at this point. Still not sure exactly why she continues to get these. She will get her mammogram in September as scheduled and we'll call back if needed.

## 2013-12-05 ENCOUNTER — Other Ambulatory Visit (INDEPENDENT_AMBULATORY_CARE_PROVIDER_SITE_OTHER): Payer: Self-pay | Admitting: General Surgery

## 2013-12-05 ENCOUNTER — Ambulatory Visit
Admission: RE | Admit: 2013-12-05 | Discharge: 2013-12-05 | Disposition: A | Discharge status: Home / Self Care | Payer: BC Managed Care – PPO | Source: Ambulatory Visit | Attending: General Surgery | Admitting: General Surgery

## 2013-12-05 ENCOUNTER — Other Ambulatory Visit: Payer: BC Managed Care – PPO

## 2013-12-05 ENCOUNTER — Telehealth (INDEPENDENT_AMBULATORY_CARE_PROVIDER_SITE_OTHER): Payer: Self-pay

## 2013-12-05 ENCOUNTER — Encounter (INDEPENDENT_AMBULATORY_CARE_PROVIDER_SITE_OTHER): Payer: Self-pay

## 2013-12-05 ENCOUNTER — Ambulatory Visit (INDEPENDENT_AMBULATORY_CARE_PROVIDER_SITE_OTHER): Payer: BC Managed Care – PPO | Admitting: General Surgery

## 2013-12-05 ENCOUNTER — Encounter (INDEPENDENT_AMBULATORY_CARE_PROVIDER_SITE_OTHER): Payer: Self-pay | Admitting: General Surgery

## 2013-12-05 VITALS — BP 122/70 | HR 85 | Temp 98.4°F | Ht 62.0 in | Wt 154.0 lb

## 2013-12-05 DIAGNOSIS — N611 Abscess of the breast and nipple: Secondary | ICD-10-CM

## 2013-12-05 DIAGNOSIS — N61 Mastitis without abscess: Secondary | ICD-10-CM

## 2013-12-05 MED ORDER — AMOXICILLIN-POT CLAVULANATE 875-125 MG PO TABS: 1.0000 | ORAL_TABLET | Freq: Two times a day (BID) | ORAL | Status: AC

## 2013-12-05 NOTE — Telephone Encounter (Signed)
Patient calling into office today requesting appointment to recheck breast.  Patient reports that her breast is red, swollen and warm to touch.  Patient scheduled to see Dr. Donne Hazel today at 1:50 pm.  Patient to arrive at 1:30 for registration.

## 2013-12-05 NOTE — Progress Notes (Signed)
Subjective:     Patient ID: Taylor Parker, female   DOB: [birth year], 48 y.o.   MRN: [id number]  HPI This is a 48 year old female on no well from prior treatments for right-sided recurrent abscesses. She was seen by one of my partners several months ago for left breast salines which resolved with antibiotics. I saw her in followup if she was doing well we decided just keep an eye on this. Yesterday she developed a tender lower outer quadrant breast mass in the left breast. This is associated with a little bit of sub-nipple discharge. The series 3 tender to touch. She states that she feels feverish and doesn't feel well overall. She comes in today to have this evaluated.  Review of Systems     Objective:   Physical Exam  Vitals reviewed. Constitutional: She appears well-developed and well-nourished.  Pulmonary/Chest: Left breast exhibits mass, skin change and tenderness. Left breast exhibits no inverted nipple and no nipple discharge.         Assessment:     Likely left breast abscess     Plan:     She will go to radiology today and undergo an ultrasound to prove this is an abscess. I think that an attempt at aspiration and antibiotics would be the first treatment. I will plan on following her up soon.

## 2013-12-05 NOTE — Addendum Note (Signed)
Addended byRolm Bookbinder on: 12/05/2013 04:50 PM   Modules accepted: Orders

## 2013-12-08 ENCOUNTER — Encounter (INDEPENDENT_AMBULATORY_CARE_PROVIDER_SITE_OTHER): Payer: Self-pay | Admitting: General Surgery

## 2013-12-08 ENCOUNTER — Ambulatory Visit (INDEPENDENT_AMBULATORY_CARE_PROVIDER_SITE_OTHER): Payer: BC Managed Care – PPO | Admitting: General Surgery

## 2013-12-08 DIAGNOSIS — N61 Mastitis without abscess: Secondary | ICD-10-CM

## 2013-12-08 DIAGNOSIS — N611 Abscess of the breast and nipple: Secondary | ICD-10-CM

## 2013-12-08 NOTE — Progress Notes (Signed)
Subjective:     Patient ID: Taylor Parker, female   DOB: 04-09-1966, 48 y.o.   MRN: [id number]  HPI 31 yof with recurrent right breast abscess. I saw her last week and started abx.  She had attempt at aspiration that is below.  She is much better today in terms of pain and size of area. She has itching at her nipple on the left side. No real nipple discharge.  Review of Systems EXAM:  DIGITAL DIAGNOSTIC LEFT MAMMOGRAM WITH CAD  ULTRASOUND LEFT BREAST  COMPARISON: Previous exams.  ACR Breast Density Category c: The breast tissue is heterogeneously  dense, which may obscure small masses.  FINDINGS:  No suspicious masses or calcifications are seen in the left breast.  There is no mammographic evidence of malignancy in the left breast.  Mammographic images were processed with CAD.  Physical examination of the left breast reveals a large firm area at  the 3 to 4 o'clock position approximately 3 cm from the nipple as  well as firmness, pain and tenderness in the subareolar left breast.  With palpation, purulent discharge is expressed from the left  nipple.  Targeted ultrasound of the left breast was performed demonstrating  an ill-defined hypoechoic area in the subareolar left breast, with 1  component of this measuring 3.5 x 1.4 x 2.6 cm. A similar appearance  is seen in the outer left breast. During ultrasound, purulent  discharge is again expressed from the left nipple.  IMPRESSION:  Extensive phlegmonous changes and likely developing abscess in the  subareolar and lateral left breast.  RECOMMENDATION:  Ultrasound-guided attempt at abscess drainage is recommended. This  will subsequently be performed and dictated separately.     Objective:   Physical Exam  Vitals reviewed. Constitutional: She appears well-developed and well-nourished.  Pulmonary/Chest: Left breast exhibits no inverted nipple, no mass, no nipple discharge, no skin change and no tenderness.           Assessment:     Recurrent right breast abscess    Plan:     This is much better today.  Will continue with antibiotics and I will see her back next week.

## 2013-12-14 ENCOUNTER — Encounter (INDEPENDENT_AMBULATORY_CARE_PROVIDER_SITE_OTHER): Payer: BC Managed Care – PPO | Admitting: General Surgery

## 2013-12-14 ENCOUNTER — Encounter (INDEPENDENT_AMBULATORY_CARE_PROVIDER_SITE_OTHER): Payer: Self-pay | Admitting: General Surgery

## 2013-12-14 ENCOUNTER — Ambulatory Visit (INDEPENDENT_AMBULATORY_CARE_PROVIDER_SITE_OTHER): Payer: BC Managed Care – PPO | Admitting: General Surgery

## 2013-12-14 VITALS — BP 126/74 | HR 77 | Temp 97.5°F | Ht 62.0 in | Wt 156.0 lb

## 2013-12-14 DIAGNOSIS — N61 Mastitis without abscess: Secondary | ICD-10-CM

## 2013-12-14 DIAGNOSIS — N611 Abscess of the breast and nipple: Secondary | ICD-10-CM

## 2013-12-14 MED ORDER — DOXYCYCLINE HYCLATE 100 MG PO TABS: 100.0000 mg | ORAL_TABLET | Freq: Two times a day (BID) | ORAL | Status: DC

## 2013-12-19 NOTE — Progress Notes (Signed)
Subjective:     Patient ID: Taylor Parker, female   DOB: 1966-04-15, 48 y.o.   MRN: [id number]  HPI 51 yof with recurrent right breast abscess with a new left breast abscess. She has been on abx.  She has no symptoms today and said this has resolved.  No nipple discharge.   Review of Systems     Objective:   Physical Exam No left breast mass or abscess    Assessment:     Resolved left breast abscess     Plan:     This has resolved. I am going to keep her on abx and see her back in a few weeks.

## 2013-12-20 ENCOUNTER — Telehealth (INDEPENDENT_AMBULATORY_CARE_PROVIDER_SITE_OTHER): Payer: Self-pay

## 2013-12-20 NOTE — Telephone Encounter (Signed)
Called pt back with Rod Holler our interpreter to advise pt to come see Dr Cristal Generous partner Dr Barry Dienes in urgent office tomorrow. We advised pt that Dr Donne Hazel is not in the office b/c he is in surgery. The pt was given appt for 6/11 arrive at 4:15/4:30. The pt is fine with this plan.

## 2013-12-20 NOTE — Telephone Encounter (Signed)
Message copied by Illene Regulus on Wed Dec 20, 2013  3:41 PM ------      Message from: Salvatore Marvel      Created: Wed Dec 20, 2013  1:41 PM      Regarding: Dr. Ivin Poot       Contact: [phone]       Patient called and stating she has since Monday 12/18/13 pain,nipple discharge, feeling a mass with discomfort in her left breast but no fever.       She is worried because before the medicine help and the effect was fast but not now, please let me know and call her.            Thank you       ------

## 2013-12-21 ENCOUNTER — Encounter (INDEPENDENT_AMBULATORY_CARE_PROVIDER_SITE_OTHER): Payer: Self-pay | Admitting: General Surgery

## 2013-12-21 ENCOUNTER — Ambulatory Visit (INDEPENDENT_AMBULATORY_CARE_PROVIDER_SITE_OTHER): Payer: BC Managed Care – PPO | Admitting: General Surgery

## 2013-12-21 VITALS — BP 126/80 | HR 75 | Temp 97.8°F | Ht 62.0 in | Wt 154.0 lb

## 2013-12-21 DIAGNOSIS — N6012 Diffuse cystic mastopathy of left breast: Secondary | ICD-10-CM | POA: Insufficient documentation

## 2013-12-21 DIAGNOSIS — N6019 Diffuse cystic mastopathy of unspecified breast: Secondary | ICD-10-CM

## 2013-12-21 MED ORDER — AMOXICILLIN-POT CLAVULANATE 875-125 MG PO TABS: 1.0000 | ORAL_TABLET | Freq: Two times a day (BID) | ORAL | Status: AC

## 2013-12-21 NOTE — Patient Instructions (Signed)
Switch to augmentin Follow up with Taylor Parker.

## 2013-12-24 NOTE — Assessment & Plan Note (Signed)
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  She has thickening and pain in the lower outer quadrant, but no fluctuance or erythema.    She has had recent imaging.  Would switch her back to augmentin and have her keep follow up appt with dr. Donne Hazel.  I do not think her pain can be treated surgically.

## 2013-12-24 NOTE — Progress Notes (Signed)
Subjective:     Patient ID: Taylor Parker, female   DOB: 01-Jun-1966, 48 y.o.   MRN: [id number]  HPI 65 yof with recurrent right breast abscess who had a new left breast abscess in the last month.  When she saw dr. Donne Hazel last, this had resolved, but given her difficulty, he put her on around 1 month of doxycycline.  She started havign pain and "growth" of the mass since she saw him.  She denies fever/ chills/breast drainage.     Review of Systems O/w neg x 11    Objective:   Physical Exam No palpable breast mass in either breast.  No erythema, skin dimpling, LAD.  Left lower outer quadrant is tender, but no fluctuant mass is present.  No erythema.       Assessment/Plan:     Chronic mastitis of left breast Pt appears to have chronic granulomatous mastitis.  She has thickening and pain in the lower outer quadrant, but no fluctuance or erythema.    She has had recent imaging.  Would switch her back to augmentin and have her keep follow up appt with dr. Donne Hazel.  I do not think her pain can be treated surgically.

## 2014-01-09 ENCOUNTER — Ambulatory Visit (INDEPENDENT_AMBULATORY_CARE_PROVIDER_SITE_OTHER): Payer: BC Managed Care – PPO | Admitting: General Surgery

## 2014-01-09 ENCOUNTER — Encounter (INDEPENDENT_AMBULATORY_CARE_PROVIDER_SITE_OTHER): Payer: Self-pay | Admitting: General Surgery

## 2014-01-09 VITALS — BP 116/70 | HR 75 | Temp 99.0°F | Ht 62.0 in | Wt 155.0 lb

## 2014-01-09 DIAGNOSIS — N6019 Diffuse cystic mastopathy of unspecified breast: Secondary | ICD-10-CM

## 2014-01-09 DIAGNOSIS — N6012 Diffuse cystic mastopathy of left breast: Secondary | ICD-10-CM

## 2014-01-09 MED ORDER — AMOXICILLIN-POT CLAVULANATE 875-125 MG PO TABS: 1.0000 | ORAL_TABLET | Freq: Two times a day (BID) | ORAL | Status: DC

## 2014-01-09 NOTE — Progress Notes (Signed)
Patient ID: Taylor Parker, female   DOB: 08-30-1965, 48 y.o.   MRN: [id number]  Chief Complaint  Patient presents with  . Breast Cancer Long Term Follow Up    HPI Taylor Parker is a 48 y.o. female.   HPI 59 yof with longstanding issues with breast abscesses. She has left sided abscess that is better but still present.  It recurs as soon as she is off abx.  Has been off since Saturday and now draining purulence from nipple with hardness at medial border of nac.  She comes in for follow up.  She just wants this area removed like the other side.  Past Medical History  Diagnosis Date  . Depression   . PMDD (premenstrual dysphoric disorder)   . Hyperlipidemia     Past Surgical History  Procedure Laterality Date  . Cesarean section      X 3  . Tubal ligation  2004  . Incise and drain abcess      right breast  . Breast surgery  8/12    right breast biopsy  . Breast biopsy  06/19/2011    Procedure: BREAST BIOPSY;  Surgeon: Rolm Bookbinder, MD;  Location: [facility];  Service: General;  Laterality: Right;  Right breast chronic abscess drainage and debridement    Family History  Problem Relation Age of Onset  . Heart disease Mother     Social History History  Substance Use Topics  . Smoking status: Never Smoker   . Smokeless tobacco: Not on file  . Alcohol Use: No    No Known Allergies  Current Outpatient Prescriptions  Medication Sig Dispense Refill  . desvenlafaxine (PRISTIQ) 50 MG 24 hr tablet Take 1 tablet (50 mg total) by mouth daily.  30 tablet  11  . amoxicillin-clavulanate (AUGMENTIN) 875-125 MG per tablet Take 1 tablet by mouth every 12 (twelve) hours.  20 tablet  0   No current facility-administered medications for this visit.    Review of Systems Review of Systems  Constitutional: Negative for fever, chills and unexpected weight change.  HENT: Negative for congestion, hearing loss, sore throat, trouble swallowing and voice  change.   Eyes: Negative for visual disturbance.  Respiratory: Negative for cough and wheezing.   Cardiovascular: Negative for chest pain, palpitations and leg swelling.  Gastrointestinal: Negative for nausea, vomiting, abdominal pain, diarrhea, constipation, blood in stool, abdominal distention and anal bleeding.  Genitourinary: Negative for hematuria, vaginal bleeding and difficulty urinating.  Musculoskeletal: Negative for arthralgias.  Skin: Negative for rash and wound.  Neurological: Negative for seizures, syncope and headaches.  Hematological: Negative for adenopathy. Does not bruise/bleed easily.  Psychiatric/Behavioral: Negative for confusion.    Blood pressure 116/70, pulse 75, temperature 99 F (37.2 C), height 5\' 2"  (1.575 m), weight 155 lb (70.308 kg).  Physical Exam Physical Exam  Vitals reviewed. Constitutional: She appears well-developed and well-nourished.  Neck: Neck supple.  Cardiovascular: Normal rate, regular rhythm and normal heart sounds.   Pulmonary/Chest: Effort normal and breath sounds normal. She has no wheezes. She has no rales. Right breast exhibits no inverted nipple, no mass, no nipple discharge, no skin change and no tenderness. Left breast exhibits nipple discharge (purulent discharge). Left breast exhibits no inverted nipple, no mass, no skin change and no tenderness.    Lymphadenopathy:    She has no cervical adenopathy.    She has no axillary adenopathy.       Right: No supraclavicular adenopathy present.  Left: No supraclavicular adenopathy present.    Data Reviewed EXAM:  DIGITAL DIAGNOSTIC LEFT MAMMOGRAM WITH CAD  ULTRASOUND LEFT BREAST  COMPARISON: Previous exams.  ACR Breast Density Category c: The breast tissue is heterogeneously  dense, which may obscure small masses.  FINDINGS:  No suspicious masses or calcifications are seen in the left breast.  There is no mammographic evidence of malignancy in the left breast.  Mammographic  images were processed with CAD.  Physical examination of the left breast reveals a large firm area at  the 3 to 4 o'clock position approximately 3 cm from the nipple as  well as firmness, pain and tenderness in the subareolar left breast.  With palpation, purulent discharge is expressed from the left  nipple.  Targeted ultrasound of the left breast was performed demonstrating  an ill-defined hypoechoic area in the subareolar left breast, with 1  component of this measuring 3.5 x 1.4 x 2.6 cm. A similar appearance  is seen in the outer left breast. During ultrasound, purulent  discharge is again expressed from the left nipple.  IMPRESSION:  Extensive phlegmonous changes and likely developing abscess in the  subareolar and lateral left breast.  RECOMMENDATION:  Ultrasound-guided attempt at abscess drainage is recommended. This  will subsequently be performed and dictated separately.  I have discussed the findings and recommendations with the patient.   Assessment   chronic left breast subareolar mass     Plan    Excision of left breast ductal system and chronic abscess  She basically recurs every time we take her off antibiotics. She still has a lot of induration and purulence and I think only real option is a central duct excision with debridement of this abscess.  We discussed this via interpreter today along with risks/benefits.    I will put her back on abx until surgery    [name] 01/09/2014, 1:38 PM

## 2014-01-18 ENCOUNTER — Encounter (HOSPITAL_COMMUNITY): Payer: Self-pay | Admitting: Pharmacy Technician

## 2014-01-23 ENCOUNTER — Encounter (HOSPITAL_COMMUNITY)
Admission: RE | Admit: 2014-01-23 | Discharge: 2014-01-23 | Disposition: A | Discharge status: Home / Self Care | Payer: BC Managed Care – PPO | Source: Ambulatory Visit | Attending: General Surgery | Admitting: General Surgery

## 2014-01-23 ENCOUNTER — Encounter (HOSPITAL_COMMUNITY): Payer: Self-pay

## 2014-01-23 HISTORY — DX: Headache: R51

## 2014-01-23 HISTORY — DX: Nausea with vomiting, unspecified: R11.2

## 2014-01-23 HISTORY — DX: Nausea with vomiting, unspecified: Z98.890

## 2014-01-23 LAB — CBC WITH DIFFERENTIAL/PLATELET
Basophils Absolute: 0 10*3/uL (ref 0.0–0.1)
Basophils Relative: 1 % (ref 0–1)
Eosinophils Absolute: 0.1 10*3/uL (ref 0.0–0.7)
Eosinophils Relative: 1 % (ref 0–5)
HCT: 39 % (ref 36.0–46.0)
Hemoglobin: 12.6 g/dL (ref 12.0–15.0)
Lymphocytes Relative: 44 % (ref 12–46)
Lymphs Abs: 2.2 10*3/uL (ref 0.7–4.0)
MCH: 28.7 pg (ref 26.0–34.0)
MCHC: 32.3 g/dL (ref 30.0–36.0)
MCV: 88.8 fL (ref 78.0–100.0)
Monocytes Absolute: 0.3 10*3/uL (ref 0.1–1.0)
Monocytes Relative: 6 % (ref 3–12)
Neutro Abs: 2.4 10*3/uL (ref 1.7–7.7)
Neutrophils Relative %: 48 % (ref 43–77)
Platelets: 234 10*3/uL (ref 150–400)
RBC: 4.39 MIL/uL (ref 3.87–5.11)
RDW: 13.9 % (ref 11.5–15.5)
WBC: 5.1 10*3/uL (ref 4.0–10.5)

## 2014-01-23 LAB — BASIC METABOLIC PANEL
Anion gap: 11 (ref 5–15)
BUN: 14 mg/dL (ref 6–23)
CO2: 26 mEq/L (ref 19–32)
Calcium: 8.9 mg/dL (ref 8.4–10.5)
Chloride: 104 mEq/L (ref 96–112)
Creatinine, Ser: 0.7 mg/dL (ref 0.50–1.10)
GFR calc Af Amer: >90 mL/min (ref >90)
GFR calc non Af Amer: >90 mL/min (ref >90)
Glucose, Bld: 82 mg/dL (ref 70–99)
Potassium: 4.6 mEq/L (ref 3.7–5.3)
Sodium: 141 mEq/L (ref 137–147)

## 2014-01-23 MED ORDER — CEFAZOLIN SODIUM-DEXTROSE 2-3 GM-% IV SOLR
2.0000 g | INTRAVENOUS | Status: AC
  Administered 2014-01-24: 2 g via INTRAVENOUS

## 2014-01-23 NOTE — Pre-Procedure Instructions (Signed)
Delphine Sizemore  01/23/2014   Your procedure is scheduled on:  Wednesday July 15 th at 1200 PM  Report to [facility] Admitting at 1000 AM.  Call this number if you have problems the morning of surgery: [phone]   Remember:   Do not eat food or drink liquids after midnight.   Take these medicines the morning of surgery with A SIP OF WATER: NONE Stop Aspirin, Ibuprofen , Advil, Aleve, Fish oil , Vitamins and herbal medication as of today.  Do not wear jewelry, make-up or nail polish.  Do not wear lotions, powders, or perfumes. You may wear deodorant.  Do not shave 48 hours prior to surgery.   Do not bring valuables to the hospital.  [facility] is not responsible for any belongings or valuables.               Contacts, dentures or bridgework may not be worn into surgery.  Leave suitcase in the car. After surgery it may be brought to your room.  For patients admitted to the hospital, discharge time is determined by your  treatment team.               Patients discharged the day of surgery will not be allowed to drive home.    Special Instructions: [place] - Preparing for Surgery  Before surgery, you can play an important role.  Because skin is not sterile, your skin needs to be as free of germs as possible.  You can reduce the number of germs on you skin by washing with CHG (chlorahexidine gluconate) soap before surgery.  CHG is an antiseptic cleaner which kills germs and bonds with the skin to continue killing germs even after washing.  Please DO NOT use if you have an allergy to CHG or antibacterial soaps.  If your skin becomes reddened/irritated stop using the CHG and inform your nurse when you arrive at Short Stay.  Do not shave (including legs and underarms) for at least 48 hours prior to the first CHG shower.  You may shave your face.  Please follow these instructions carefully:   1.  Shower with CHG Soap the night before surgery and the                                 morning of Surgery.  2.  If you choose to wash your hair, wash your hair first as usual with your       normal shampoo.  3.  After you shampoo, rinse your hair and body thoroughly to remove the                      Shampoo.  4.  Use CHG as you would any other liquid soap.  You can apply chg directly       to the skin and wash gently with scrungie or a clean washcloth.  5.  Apply the CHG Soap to your body ONLY FROM THE NECK DOWN.        Do not use on open wounds or open sores.  Avoid contact with your eyes,       ears, mouth and genitals (private parts).  Wash genitals (private parts)       with your normal soap.  6.  Wash thoroughly, paying special attention to the area where your surgery        will be performed.  7.  Thoroughly rinse your body with warm water from the neck down.  8.  DO NOT shower/wash with your normal soap after using and rinsing off       the CHG Soap.  9.  Pat yourself dry with a clean towel.            10.  Wear clean pajamas.            11.  Place clean sheets on your bed the night of your first shower and do not        sleep with pets.  Day of Surgery  Do not apply any lotions/deoderants the morning of surgery.  Please wear clean clothes to the hospital/surgery center.      Please read over the following fact sheets that you were given: Pain Booklet, Coughing and Deep Breathing and Surgical Site Infection Prevention

## 2014-01-24 ENCOUNTER — Encounter (HOSPITAL_COMMUNITY): Payer: Self-pay | Admitting: *Deleted

## 2014-01-24 ENCOUNTER — Ambulatory Visit (HOSPITAL_COMMUNITY)
Admission: RE | Admit: 2014-01-24 | Discharge: 2014-01-24 | Disposition: A | Discharge status: Home / Self Care | Payer: BC Managed Care – PPO | Source: Ambulatory Visit | Attending: General Surgery | Admitting: General Surgery

## 2014-01-24 ENCOUNTER — Encounter (HOSPITAL_COMMUNITY)
Admission: RE | Disposition: A | Discharge status: Home / Self Care | Payer: Self-pay | Source: Ambulatory Visit | Attending: General Surgery

## 2014-01-24 ENCOUNTER — Encounter (HOSPITAL_COMMUNITY): Payer: BC Managed Care – PPO | Admitting: Anesthesiology

## 2014-01-24 ENCOUNTER — Ambulatory Visit (HOSPITAL_COMMUNITY): Payer: BC Managed Care – PPO | Admitting: Anesthesiology

## 2014-01-24 ENCOUNTER — Telehealth (INDEPENDENT_AMBULATORY_CARE_PROVIDER_SITE_OTHER): Payer: Self-pay

## 2014-01-24 DIAGNOSIS — D249 Benign neoplasm of unspecified breast: Secondary | ICD-10-CM

## 2014-01-24 DIAGNOSIS — Z79899 Other long term (current) drug therapy: Secondary | ICD-10-CM | POA: Insufficient documentation

## 2014-01-24 HISTORY — PX: BREAST DUCTAL SYSTEM EXCISION: SHX5242

## 2014-01-24 LAB — HCG, SERUM, QUALITATIVE: Preg, Serum: NEGATIVE

## 2014-01-24 SURGERY — EXCISION DUCTAL SYSTEM BREAST
Anesthesia: General | Site: Breast | Laterality: Left

## 2014-01-24 MED ORDER — 0.9 % SODIUM CHLORIDE (POUR BTL) OPTIME
TOPICAL | Status: DC | PRN
  Administered 2014-01-24: 1000 mL

## 2014-01-24 MED ORDER — METOCLOPRAMIDE HCL 5 MG/ML IJ SOLN
INTRAMUSCULAR | Status: AC
  Filled 2014-01-24: qty 2

## 2014-01-24 MED ORDER — BUPIVACAINE HCL (PF) 0.25 % IJ SOLN
INTRAMUSCULAR | Status: AC
  Filled 2014-01-24: qty 30

## 2014-01-24 MED ORDER — METOCLOPRAMIDE HCL 5 MG/ML IJ SOLN
INTRAMUSCULAR | Status: DC | PRN
  Administered 2014-01-24: 10 mg via INTRAVENOUS

## 2014-01-24 MED ORDER — LIDOCAINE HCL (CARDIAC) 20 MG/ML IV SOLN
INTRAVENOUS | Status: DC | PRN
  Administered 2014-01-24: 60 mg via INTRAVENOUS

## 2014-01-24 MED ORDER — OXYCODONE HCL 5 MG PO TABS: 5.0000 mg | ORAL_TABLET | Freq: Once | ORAL | Status: DC | PRN

## 2014-01-24 MED ORDER — DEXAMETHASONE SODIUM PHOSPHATE 4 MG/ML IJ SOLN
INTRAMUSCULAR | Status: AC
  Filled 2014-01-24: qty 1

## 2014-01-24 MED ORDER — AMOXICILLIN-POT CLAVULANATE 875-125 MG PO TABS: 1.0000 | ORAL_TABLET | Freq: Two times a day (BID) | ORAL | Status: DC

## 2014-01-24 MED ORDER — METOCLOPRAMIDE HCL 5 MG/ML IJ SOLN: 10.0000 mg | Freq: Once | INTRAMUSCULAR | Status: DC | PRN

## 2014-01-24 MED ORDER — LACTATED RINGERS IV SOLN
INTRAVENOUS | Status: DC
  Administered 2014-01-24: 11:00:00 via INTRAVENOUS

## 2014-01-24 MED ORDER — LIDOCAINE HCL (CARDIAC) 20 MG/ML IV SOLN
INTRAVENOUS | Status: AC
Start: 2014-01-24 — End: 2014-01-24
  Filled 2014-01-24: qty 5

## 2014-01-24 MED ORDER — PROPOFOL 10 MG/ML IV BOLUS
INTRAVENOUS | Status: DC | PRN
  Administered 2014-01-24: 160 mg via INTRAVENOUS

## 2014-01-24 MED ORDER — ROCURONIUM BROMIDE 50 MG/5ML IV SOLN
INTRAVENOUS | Status: AC
  Filled 2014-01-24: qty 1

## 2014-01-24 MED ORDER — MIDAZOLAM HCL 5 MG/5ML IJ SOLN
INTRAMUSCULAR | Status: DC | PRN
  Administered 2014-01-24: 2 mg via INTRAVENOUS

## 2014-01-24 MED ORDER — FENTANYL CITRATE 0.05 MG/ML IJ SOLN
INTRAMUSCULAR | Status: AC
  Filled 2014-01-24: qty 5

## 2014-01-24 MED ORDER — LACTATED RINGERS IV SOLN
INTRAVENOUS | Status: DC | PRN
  Administered 2014-01-24: 12:00:00 via INTRAVENOUS

## 2014-01-24 MED ORDER — MIDAZOLAM HCL 2 MG/2ML IJ SOLN
INTRAMUSCULAR | Status: AC
  Filled 2014-01-24: qty 2

## 2014-01-24 MED ORDER — ONDANSETRON HCL 4 MG/2ML IJ SOLN
INTRAMUSCULAR | Status: DC | PRN
  Administered 2014-01-24: 4 mg via INTRAVENOUS

## 2014-01-24 MED ORDER — PROPOFOL 10 MG/ML IV BOLUS
INTRAVENOUS | Status: AC
  Filled 2014-01-24: qty 20

## 2014-01-24 MED ORDER — ONDANSETRON HCL 4 MG/2ML IJ SOLN
INTRAMUSCULAR | Status: AC
  Filled 2014-01-24: qty 2

## 2014-01-24 MED ORDER — BUPIVACAINE HCL 0.25 % IJ SOLN
INTRAMUSCULAR | Status: DC | PRN
  Administered 2014-01-24: 12 mL

## 2014-01-24 MED ORDER — OXYCODONE HCL 5 MG/5ML PO SOLN: 5.0000 mg | Freq: Once | ORAL | Status: DC | PRN

## 2014-01-24 MED ORDER — CEFAZOLIN SODIUM-DEXTROSE 2-3 GM-% IV SOLR
INTRAVENOUS | Status: AC
  Filled 2014-01-24: qty 50

## 2014-01-24 MED ORDER — FENTANYL CITRATE 0.05 MG/ML IJ SOLN
INTRAMUSCULAR | Status: DC | PRN
  Administered 2014-01-24 (×3): 50 ug via INTRAVENOUS

## 2014-01-24 MED ORDER — OXYCODONE-ACETAMINOPHEN 10-325 MG PO TABS: 1.0000 | ORAL_TABLET | Freq: Four times a day (QID) | ORAL | Status: DC | PRN

## 2014-01-24 MED ORDER — HYDROMORPHONE HCL PF 1 MG/ML IJ SOLN: 0.2500 mg | INTRAMUSCULAR | Status: DC | PRN

## 2014-01-24 MED ORDER — DEXAMETHASONE SODIUM PHOSPHATE 4 MG/ML IJ SOLN
INTRAMUSCULAR | Status: DC | PRN
  Administered 2014-01-24: 4 mg via INTRAVENOUS

## 2014-01-24 SURGICAL SUPPLY — 45 items
APL SKNCLS STERI-STRIP NONHPOA (GAUZE/BANDAGES/DRESSINGS) ×1
BENZOIN TINCTURE PRP APPL 2/3 (GAUZE/BANDAGES/DRESSINGS) ×2 IMPLANT
BLADE SURG ROTATE 9660 (MISCELLANEOUS) IMPLANT
CANISTER SUCTION 2500CC (MISCELLANEOUS) ×1 IMPLANT
CHLORAPREP W/TINT 26ML (MISCELLANEOUS) ×2 IMPLANT
COVER SURGICAL LIGHT HANDLE (MISCELLANEOUS) ×2 IMPLANT
DRAIN PENROSE 1/4X12 LTX STRL (WOUND CARE) ×1 IMPLANT
DRAPE PED LAPAROTOMY (DRAPES) ×2 IMPLANT
ELECT CAUTERY BLADE 6.4 (BLADE) ×2 IMPLANT
ELECT REM PT RETURN 9FT ADLT (ELECTROSURGICAL) ×2
ELECTRODE REM PT RTRN 9FT ADLT (ELECTROSURGICAL) ×1 IMPLANT
GLOVE BIO SURGEON STRL SZ 6 (GLOVE) ×1 IMPLANT
GLOVE BIO SURGEON STRL SZ7 (GLOVE) ×2 IMPLANT
GLOVE BIOGEL PI IND STRL 6.5 (GLOVE) IMPLANT
GLOVE BIOGEL PI IND STRL 7.5 (GLOVE) ×1 IMPLANT
GLOVE BIOGEL PI INDICATOR 6.5 (GLOVE) ×2
GLOVE BIOGEL PI INDICATOR 7.5 (GLOVE) ×1
GLOVE ECLIPSE 6.5 STRL STRAW (GLOVE) ×1 IMPLANT
GOWN STRL REUS W/ TWL LRG LVL3 (GOWN DISPOSABLE) ×2 IMPLANT
GOWN STRL REUS W/TWL LRG LVL3 (GOWN DISPOSABLE) ×6
KIT BASIN OR (CUSTOM PROCEDURE TRAY) ×2 IMPLANT
KIT ROOM TURNOVER OR (KITS) ×2 IMPLANT
NDL HYPO 25GX1X1/2 BEV (NEEDLE) ×1 IMPLANT
NEEDLE HYPO 25GX1X1/2 BEV (NEEDLE) ×2 IMPLANT
NS IRRIG 1000ML POUR BTL (IV SOLUTION) ×2 IMPLANT
PACK SURGICAL SETUP 50X90 (CUSTOM PROCEDURE TRAY) ×2 IMPLANT
PAD ARMBOARD 7.5X6 YLW CONV (MISCELLANEOUS) ×2 IMPLANT
PENCIL BUTTON HOLSTER BLD 10FT (ELECTRODE) ×2 IMPLANT
SPONGE GAUZE 4X4 12PLY STER LF (GAUZE/BANDAGES/DRESSINGS) ×1 IMPLANT
SPONGE LAP 4X18 X RAY DECT (DISPOSABLE) ×2 IMPLANT
STRIP CLOSURE SKIN 1/2X4 (GAUZE/BANDAGES/DRESSINGS) ×2 IMPLANT
SUT ETHILON 3 0 PS 1 (SUTURE) ×1 IMPLANT
SUT MON AB 5-0 PS2 18 (SUTURE) ×2 IMPLANT
SUT SILK 2 0 SH (SUTURE) ×2 IMPLANT
SUT VIC AB 2-0 SH 27 (SUTURE) ×2
SUT VIC AB 2-0 SH 27XBRD (SUTURE) ×1 IMPLANT
SUT VIC AB 3-0 SH 27 (SUTURE) ×4
SUT VIC AB 3-0 SH 27X BRD (SUTURE) ×1 IMPLANT
SYR BULB 3OZ (MISCELLANEOUS) ×2 IMPLANT
SYR CONTROL 10ML LL (SYRINGE) ×2 IMPLANT
TAPE CLOTH SURG 4X10 WHT LF (GAUZE/BANDAGES/DRESSINGS) ×1 IMPLANT
TOWEL OR 17X24 6PK STRL BLUE (TOWEL DISPOSABLE) ×2 IMPLANT
TOWEL OR 17X26 10 PK STRL BLUE (TOWEL DISPOSABLE) ×2 IMPLANT
TUBE CONNECTING 12X1/4 (SUCTIONS) ×1 IMPLANT
YANKAUER SUCT BULB TIP NO VENT (SUCTIONS) ×1 IMPLANT

## 2014-01-24 NOTE — Op Note (Signed)
Preoperative diagnosis: chronic left breast subareolar abscess Postoperative diagnosis: Same as above Procedure: Left breast subareolar duct and chronic abscess excision Surgeon: Dr. Serita Grammes anesthesia: Gen. Estimated blood loss: Minimal Complications: None Drains: Penrose drain placed in the wound externally Specimens: Left breast tissue marked short stitch superior, long stitch lateral, double stitch in the Sponge needle counts correct completion Disposition to recovery stable  Indications: This 60 yof who I know well from multiple breast abscesses. She continues to have a left breast subareolar abscess that has made her have to stay on antibiotic therapy to keep suppressed. There is a large cavity noted on ultrasound. Due to the fact that this is not healing conservatively we discussed going to the operating room and excising this ductal system and chronic abscess. She understands risks and benefits prior to beginning.  Procedure: After informed consent was obtained the patient was taken to the operating room. She was given cefazolin. Sequential compression devices were on her legs. She was placed under general anesthesia without complication. Her left breast was prepped and draped in the standard sterile surgical fashion. Surgical timeout was then performed.  I made a periareolar incision on the medial aspect of her left nipple areolar complex. I then dissected down and noted a chronic abscess cavity. This included all of her subareolar ducts and there was a lot of old thick material present throughout this area but was not really an active infection as much. I removed this in its entirety. I then obtained hemostasis. Irrigation was performed. I did place a quarter-inch Penrose drain and attached this with a nylon suture. I then closed the incision with 2-0 Vicryl, 3-0 Vicryl, and 5-0 Monocryl. Steri-Strips are placed. Dressing was placed. She tolerated this well and was transferred to  recovery stable.

## 2014-01-24 NOTE — Anesthesia Preprocedure Evaluation (Signed)
Anesthesia Evaluation  Patient identified by MRN, date of birth, ID band Patient awake    Reviewed: Allergy & Precautions, H&P , NPO status , Patient's Chart, lab work & pertinent test results, reviewed documented beta blocker date and time   History of Anesthesia Complications (+) PONV and history of anesthetic complications  Airway Mallampati: II TM Distance: >3 FB Neck ROM: full    Dental   Pulmonary neg pulmonary ROS,  breath sounds clear to auscultation        Cardiovascular + Peripheral Vascular Disease Rhythm:regular     Neuro/Psych  Headaches, PSYCHIATRIC DISORDERS    GI/Hepatic negative GI ROS, Neg liver ROS,   Endo/Other  negative endocrine ROS  Renal/GU negative Renal ROS  negative genitourinary   Musculoskeletal   Abdominal   Peds  Hematology negative hematology ROS (+)   Anesthesia Other Findings See surgeon's H&P   Reproductive/Obstetrics negative OB ROS                           Anesthesia Physical Anesthesia Plan  ASA: II  Anesthesia Plan: General   Post-op Pain Management:    Induction: Intravenous  Airway Management Planned: LMA  Additional Equipment:   Intra-op Plan:   Post-operative Plan:   Informed Consent: I have reviewed the patients History and Physical, chart, labs and discussed the procedure including the risks, benefits and alternatives for the proposed anesthesia with the patient or authorized representative who has indicated his/her understanding and acceptance.   Dental Advisory Given  Plan Discussed with: CRNA and Surgeon  Anesthesia Plan Comments:         Anesthesia Quick Evaluation

## 2014-01-24 NOTE — Telephone Encounter (Signed)
Pt called and notified of appt with Dr Donne Hazel for 7/21 arrive at 8:00/8:15.

## 2014-01-24 NOTE — Transfer of Care (Signed)
Immediate Anesthesia Transfer of Care Note  Patient: Taylor Parker  Procedure(s) Performed: Procedure(s):  LEFT BREAST DUCT EXCISION (Left)  Patient Location: PACU  Anesthesia Type:General  Level of Consciousness: awake, alert  and oriented  Airway & Oxygen Therapy: Patient Spontanous Breathing and Patient connected to nasal cannula oxygen  Post-op Assessment: Report given to PACU RN, Post -op Vital signs reviewed and stable and Patient moving all extremities X 4  Post vital signs: Reviewed and stable  Complications: No apparent anesthesia complications

## 2014-01-24 NOTE — Anesthesia Procedure Notes (Signed)
Procedure Name: LMA Insertion Date/Time: 01/24/2014 11:40 AM Performed by: Trixie Deis A Pre-anesthesia Checklist: Patient identified, Timeout performed, Emergency Drugs available, Patient being monitored and Suction available Patient Re-evaluated:Patient Re-evaluated prior to inductionOxygen Delivery Method: Circle system utilized Preoxygenation: Pre-oxygenation with 100% oxygen Intubation Type: IV induction Ventilation: Mask ventilation without difficulty LMA: LMA inserted LMA Size: 4.0 Number of attempts: 1 Placement Confirmation: positive ETCO2 and breath sounds checked- equal and bilateral Tube secured with: Tape Dental Injury: Teeth and Oropharynx as per pre-operative assessment

## 2014-01-24 NOTE — Interval H&P Note (Signed)
History and Physical Interval Note:  01/24/2014 11:21 AM  Taylor Parker  has presented today for surgery, with the diagnosis of  left breast drainage  The various methods of treatment have been discussed with the patient and family. After consideration of risks, benefits and other options for treatment, the patient has consented to  Procedure(s):  LEFT BREAST DUCT EXCISION (Left) as a surgical intervention .  The patient's history has been reviewed, patient examined, no change in status, stable for surgery.  I have reviewed the patient's chart and labs.  Questions were answered to the patient's satisfaction.     Kinzey Sheriff

## 2014-01-24 NOTE — H&P (View-Only) (Signed)
Patient ID: Taylor Parker, female   DOB: Dec 24, 1965, 48 y.o.   MRN: [id number]  Chief Complaint  Patient presents with  . Breast Cancer Long Term Follow Up    HPI Taylor Parker is a 48 y.o. female.   HPI 26 yof with longstanding issues with breast abscesses. She has left sided abscess that is better but still present.  It recurs as soon as she is off abx.  Has been off since Saturday and now draining purulence from nipple with hardness at medial border of nac.  She comes in for follow up.  She just wants this area removed like the other side.  Past Medical History  Diagnosis Date  . Depression   . PMDD (premenstrual dysphoric disorder)   . Hyperlipidemia     Past Surgical History  Procedure Laterality Date  . Cesarean section      X 3  . Tubal ligation  2004  . Incise and drain abcess      right breast  . Breast surgery  8/12    right breast biopsy  . Breast biopsy  06/19/2011    Procedure: BREAST BIOPSY;  Surgeon: Rolm Bookbinder, MD;  Location: [facility];  Service: General;  Laterality: Right;  Right breast chronic abscess drainage and debridement    Family History  Problem Relation Age of Onset  . Heart disease Mother     Social History History  Substance Use Topics  . Smoking status: Never Smoker   . Smokeless tobacco: Not on file  . Alcohol Use: No    No Known Allergies  Current Outpatient Prescriptions  Medication Sig Dispense Refill  . desvenlafaxine (PRISTIQ) 50 MG 24 hr tablet Take 1 tablet (50 mg total) by mouth daily.  30 tablet  11  . amoxicillin-clavulanate (AUGMENTIN) 875-125 MG per tablet Take 1 tablet by mouth every 12 (twelve) hours.  20 tablet  0   No current facility-administered medications for this visit.    Review of Systems Review of Systems  Constitutional: Negative for fever, chills and unexpected weight change.  HENT: Negative for congestion, hearing loss, sore throat, trouble swallowing and voice  change.   Eyes: Negative for visual disturbance.  Respiratory: Negative for cough and wheezing.   Cardiovascular: Negative for chest pain, palpitations and leg swelling.  Gastrointestinal: Negative for nausea, vomiting, abdominal pain, diarrhea, constipation, blood in stool, abdominal distention and anal bleeding.  Genitourinary: Negative for hematuria, vaginal bleeding and difficulty urinating.  Musculoskeletal: Negative for arthralgias.  Skin: Negative for rash and wound.  Neurological: Negative for seizures, syncope and headaches.  Hematological: Negative for adenopathy. Does not bruise/bleed easily.  Psychiatric/Behavioral: Negative for confusion.    Blood pressure 116/70, pulse 75, temperature 99 F (37.2 C), height 5\' 2"  (1.575 m), weight 155 lb (70.308 kg).  Physical Exam Physical Exam  Vitals reviewed. Constitutional: She appears well-developed and well-nourished.  Neck: Neck supple.  Cardiovascular: Normal rate, regular rhythm and normal heart sounds.   Pulmonary/Chest: Effort normal and breath sounds normal. She has no wheezes. She has no rales. Right breast exhibits no inverted nipple, no mass, no nipple discharge, no skin change and no tenderness. Left breast exhibits nipple discharge (purulent discharge). Left breast exhibits no inverted nipple, no mass, no skin change and no tenderness.    Lymphadenopathy:    She has no cervical adenopathy.    She has no axillary adenopathy.       Right: No supraclavicular adenopathy present.  Left: No supraclavicular adenopathy present.    Data Reviewed EXAM:  DIGITAL DIAGNOSTIC LEFT MAMMOGRAM WITH CAD  ULTRASOUND LEFT BREAST  COMPARISON: Previous exams.  ACR Breast Density Category c: The breast tissue is heterogeneously  dense, which may obscure small masses.  FINDINGS:  No suspicious masses or calcifications are seen in the left breast.  There is no mammographic evidence of malignancy in the left breast.  Mammographic  images were processed with CAD.  Physical examination of the left breast reveals a large firm area at  the 3 to 4 o'clock position approximately 3 cm from the nipple as  well as firmness, pain and tenderness in the subareolar left breast.  With palpation, purulent discharge is expressed from the left  nipple.  Targeted ultrasound of the left breast was performed demonstrating  an ill-defined hypoechoic area in the subareolar left breast, with 1  component of this measuring 3.5 x 1.4 x 2.6 cm. A similar appearance  is seen in the outer left breast. During ultrasound, purulent  discharge is again expressed from the left nipple.  IMPRESSION:  Extensive phlegmonous changes and likely developing abscess in the  subareolar and lateral left breast.  RECOMMENDATION:  Ultrasound-guided attempt at abscess drainage is recommended. This  will subsequently be performed and dictated separately.  I have discussed the findings and recommendations with the patient.   Assessment   chronic left breast subareolar mass     Plan    Excision of left breast ductal system and chronic abscess  She basically recurs every time we take her off antibiotics. She still has a lot of induration and purulence and I think only real option is a central duct excision with debridement of this abscess.  We discussed this via interpreter today along with risks/benefits.    I will put her back on abx until surgery    Deloris Moger 01/09/2014, 1:38 PM

## 2014-01-24 NOTE — Discharge Instructions (Signed)
[facility] °Office Phone Number [phone] ° °BREAST BIOPSY/ PARTIAL MASTECTOMY: POST OP INSTRUCTIONS ° °Always review your discharge instruction sheet given to you by the facility where your surgery was performed. ° °IF YOU HAVE DISABILITY OR FAMILY LEAVE FORMS, YOU MUST BRING THEM TO THE OFFICE FOR PROCESSING.  DO NOT GIVE THEM TO YOUR DOCTOR. ° °1. A prescription for pain medication may be given to you upon discharge.  Take your pain medication as prescribed, if needed.  If narcotic pain medicine is not needed, then you may take acetaminophen (Tylenol), naprosyn (Alleve) or ibuprofen (Advil) as needed. °2. Take your usually prescribed medications unless otherwise directed °3. If you need a refill on your pain medication, please contact your pharmacy.  They will contact our office to request authorization.  Prescriptions will not be filled after 5pm or on week-ends. °4. You should eat very light the first 24 hours after surgery, such as soup, crackers, pudding, etc.  Resume your normal diet the day after surgery. °5. Most patients will experience some swelling and bruising in the breast.  Ice packs and a good support bra will help.  Wear the breast binder provided or a sports bra for 72 hours day and night.  After that wear a sports bra during the day until you return to the office. Swelling and bruising can take several days to resolve.  °6. It is common to experience some constipation if taking pain medication after surgery.  Increasing fluid intake and taking a stool softener will usually help or prevent this problem from occurring.  A mild laxative (Milk of Magnesia or Miralax) should be taken according to package directions if there are no bowel movements after 48 hours. °7. Unless discharge instructions indicate otherwise, you may remove your bandages 48 hours after surgery and you may shower at that time.  You may have steri-strips (small skin tapes) in place directly over the incision.   These strips should be left on the skin for 7-10 days and will come off on their own.  If your surgeon used skin glue on the incision, you may shower in 24 hours.  The glue will flake off over the next 2-3 weeks.  Any sutures or staples will be removed at the office during your follow-up visit. °8. ACTIVITIES:  You may resume regular daily activities (gradually increasing) beginning the next day.  Wearing a good support bra or sports bra minimizes pain and swelling.  You may have sexual intercourse when it is comfortable. °a. You may drive when you no longer are taking prescription pain medication, you can comfortably wear a seatbelt, and you can safely maneuver your car and apply brakes. °b. RETURN TO WORK:  ______________________________________________________________________________________ °9. You should see your doctor in the office for a follow-up appointment approximately two weeks after your surgery.  Your doctor’s nurse will typically make your follow-up appointment when she calls you with your pathology report.  Expect your pathology report 3-4 business days after your surgery.  You may call to check if you do not hear from us after three days. °10. OTHER INSTRUCTIONS: _______________________________________________________________________________________________ _____________________________________________________________________________________________________________________________________ °_____________________________________________________________________________________________________________________________________ °_____________________________________________________________________________________________________________________________________ ° °WHEN TO CALL DR Tarnesha Ulloa: °1. Fever over 101.0 °2. Nausea and/or vomiting. °3. Extreme swelling or bruising. °4. Continued bleeding from incision. °5. Increased pain, redness, or drainage from the incision. ° °The clinic staff is available to  answer your questions during regular business hours.  Please don’t hesitate to call and ask to speak to one of the nurses for   clinical concerns.  If you have a medical emergency, go to the nearest emergency room or call 911.  A surgeon from [facility] is always on call at the hospital.  For further questions, please visit [email or web address]     There is drain coming from wound that will need to be covered up to prevent leakage onto clothes. Will remove early next week.

## 2014-01-24 NOTE — Anesthesia Postprocedure Evaluation (Signed)
Anesthesia Post Note  Patient: Taylor Parker  Procedure(s) Performed: Procedure(s) (LRB):  LEFT BREAST DUCT EXCISION (Left)  Anesthesia type: General  Patient location: PACU  Post pain: Pain level controlled  Post assessment: Patient's Cardiovascular Status Stable  Last Vitals:  Filed Vitals:   01/24/14 1245  BP: 118/58  Pulse: 54  Temp:   Resp: 13    Post vital signs: Reviewed and stable  Level of consciousness: alert  Complications: No apparent anesthesia complications

## 2014-01-26 ENCOUNTER — Encounter (HOSPITAL_COMMUNITY): Payer: Self-pay | Admitting: General Surgery

## 2014-01-30 ENCOUNTER — Encounter (INDEPENDENT_AMBULATORY_CARE_PROVIDER_SITE_OTHER): Payer: Self-pay

## 2014-01-30 ENCOUNTER — Ambulatory Visit (INDEPENDENT_AMBULATORY_CARE_PROVIDER_SITE_OTHER): Payer: BC Managed Care – PPO | Admitting: General Surgery

## 2014-01-30 ENCOUNTER — Encounter (INDEPENDENT_AMBULATORY_CARE_PROVIDER_SITE_OTHER): Payer: Self-pay | Admitting: General Surgery

## 2014-01-30 VITALS — BP 100/60 | HR 70 | Resp 16 | Wt 154.0 lb

## 2014-01-30 DIAGNOSIS — Z09 Encounter for follow-up examination after completed treatment for conditions other than malignant neoplasm: Secondary | ICD-10-CM

## 2014-01-30 NOTE — Progress Notes (Signed)
Subjective:     Patient ID: Taylor Parker, female   DOB: [birth year], 48 y.o.   MRN: [id number]  HPI 21 yof s/p excision of left breast subareolar ducts for chronic abscess. Path is benign. I left penrose at time of surgery.  She is on abx which she will complete soon.  She has no complaints. Minimal out of drain now.  Review of Systems     Objective:   Physical Exam    left breast periareolar incision clean without infection, I removed penrose today  Assessment:     S/p left breast chronic abscess excision     Plan:     I removed penrose. May shower, may return to work.  Will plan on seeing her in 2 weeks.

## 2014-02-12 ENCOUNTER — Other Ambulatory Visit (INDEPENDENT_AMBULATORY_CARE_PROVIDER_SITE_OTHER): Payer: Self-pay | Admitting: General Surgery

## 2014-02-12 MED ORDER — AMOXICILLIN-POT CLAVULANATE 875-125 MG PO TABS: 1.0000 | ORAL_TABLET | Freq: Two times a day (BID) | ORAL | Status: DC

## 2014-02-16 ENCOUNTER — Ambulatory Visit (INDEPENDENT_AMBULATORY_CARE_PROVIDER_SITE_OTHER): Payer: BC Managed Care – PPO | Admitting: General Surgery

## 2014-02-16 ENCOUNTER — Encounter (INDEPENDENT_AMBULATORY_CARE_PROVIDER_SITE_OTHER): Payer: Self-pay | Admitting: General Surgery

## 2014-02-16 VITALS — BP 100/64 | Ht 64.0 in | Wt 154.0 lb

## 2014-02-16 DIAGNOSIS — Z09 Encounter for follow-up examination after completed treatment for conditions other than malignant neoplasm: Secondary | ICD-10-CM

## 2014-02-16 NOTE — Progress Notes (Signed)
Subjective:     Patient ID: Taylor Parker, female   DOB: [birth year], 48 y.o.   MRN: [id number]  HPI This is a 48 year old female who I know very well. She has recently undergone a left breast subareolar duct excision. She had some drainage from her nipple and some tenderness. She comes in today without any fevers. She additionally some of the drainage and this is just serous drainage from her nipple. She hasn't tenderness laterally she is concerned about also. She overall otherwise is doing very well.  Review of Systems     Objective:   Physical Exam Left breast incision healing without any evidence of infection, there is a scant amount of serous discharge from the nipple, there is some tenderness lateral to the areola that I think is just scar tissue    Assessment:     Status post left breast subareolar duct excision     Plan:     I think this is just some serous fluid in the cavity of the surgery discharging out of her nipple. I do not think she currently has an infection. We discussed conservative measures including ice, NSAIDs, wearing a good support bra. I think this will just resolve on its own. I will plan on seeing her back in one month.

## 2014-02-19 ENCOUNTER — Encounter: Payer: Self-pay | Admitting: Gynecology

## 2014-02-19 ENCOUNTER — Ambulatory Visit (INDEPENDENT_AMBULATORY_CARE_PROVIDER_SITE_OTHER): Payer: BC Managed Care – PPO | Admitting: Gynecology

## 2014-02-19 VITALS — BP 116/78

## 2014-02-19 DIAGNOSIS — N951 Menopausal and female climacteric states: Secondary | ICD-10-CM

## 2014-02-19 DIAGNOSIS — N923 Ovulation bleeding: Secondary | ICD-10-CM

## 2014-02-19 DIAGNOSIS — N921 Excessive and frequent menstruation with irregular cycle: Secondary | ICD-10-CM

## 2014-02-19 DIAGNOSIS — Z872 Personal history of diseases of the skin and subcutaneous tissue: Secondary | ICD-10-CM | POA: Insufficient documentation

## 2014-02-19 NOTE — Progress Notes (Signed)
   Patient presents to the office to discuss 2 issues. #1 patient for quite some time has been complaining of intermenstrual bleeding. This is the first time she as mentioned this to me. The patient then seen by our nurse practitioner Elon Alas in February of this year for some old Elenore Rota discomfort and her ultrasound had demonstrated the following:  Anteverted arcuate versus subseptate uterus with tri layered endometrium. Right ovary normal. Left ovary thickwalled involuting cyst 22 x 17 x 60 mm with positive CFD on the periphery. Negative cul-de-sac. Endometrium 11 mm.  Patient has been complaining of some vasomotor symptoms as well. Patient is status post left breast subareolar duct excision by Dr. Donne Hazel and pathology report had demonstrated:  Breast, excision, Left breast duct system - BENIGN INTRADUCTAL PAPILLOMA  Patient has followup lipoma with him next month. Patient is currently menstruating.  Breast exam: Both breasts were examined sitting supine position no palpable masses tenderness no supraclavicular axillary lymphadenopathy. Left breast subareolar duct excision site completely healed. Nonerythematous.  Assessment/plan: Perimenopausal patient with intermenstrual bleeding. We will check a TSH and FSH today. She will return back to the office next week for a sonohysterogram and endometrial biopsy to rule out any intrauterine pathology. She will follow up with Dr. Donne Hazel next month as per his recommendation.

## 2014-02-19 NOTE — Patient Instructions (Signed)
Perimenopausia (Perimenopause) La perimenopausia es el momento en que su cuerpo comienza a pasar a la menopausia (sin menstruacin durante 12 meses consecutivos). Es un proceso natural. La perimenopausia puede comenzar entre 2 y 33 aos antes de la menopausia y por lo general tiene una duracin de 1 ao ms pasada la menopausia. [employer], los ovarios podran producir un vulo o no. Los ovarios varan su produccin de las hormonas estrgeno y Technical brewer. Esto puede causar perodos menstruales irregulares, dificultad para quedar embarazada, hemorragia vaginal entre perodos y sntomas incmodos. CAUSAS  Produccin irregular de las hormonas ovricas estrgeno y Immunologist, y no ovular todos los meses.  Otras causas son:  Tumor de la glndula pituitaria.  Enfermedades que [employer] ovarios.  Radioterapia.  Quimioterapia.  Causas desconocidas.  Fumar mucho y abusar del consumo de alcohol puede llevar a que la perimenopausia aparezca antes. SIGNOS Y SNTOMAS   Acaloramiento.  Sudoracin nocturna.  Perodos menstruales irregulares.  Disminucin del deseo sexual.  Sequedad vaginal.  Dolores de cabeza.  Cambios en el estado de nimo.  Depresin.  Problemas de memoria.  Irritabilidad.  Cansancio.  Aumento de [place].  Problemas para quedar embarazada.  Prdida de clulas seas (osteoporosis).  Comienzo de endurecimiento de las arterias (aterosclerosis). DIAGNSTICO  El mdico realizar un diagnstico en funcin de su edad, historial de perodos menstruales y sntomas. Le realizarn un examen fsico para ver si hay algn cambio en su cuerpo, en especial en sus rganos reproductores. Las pruebas hormonales pueden ser o no tiles segn la cantidad de hormonas femeninas que produzca y [employer] produzca. Sin embargo, podrn Microbiologist pruebas hormonales para Statistician. TRATAMIENTO  En algunos casos, no se necesita tratamiento. La  decisin acerca de qu tratamiento es necesario durante la perimenopausia deber realizarse en conjunto con su mdico segn cmo estn afectando los sntomas a su estilo de vida. Existen varios tratamientos disponibles, como:  Risk manager cada sntoma individual con medicamentos especficos para ese sntoma.  Algunos medicamentos herbales pueden ayudar en sntomas especficos.  Psicoterapia.  Terapia grupal. INSTRUCCIONES PARA EL CUIDADO EN EL HOGAR   Controle sus periodos menstruales (cundo ocurren, qu tan abundantes son, cunto tiempo pasa entre perodos, y cunto duran) como tambin sus sntomas y cundo comenzaron.  Tome slo medicamentos de venta libre o recetados, segn las indicaciones del mdico.  Duerma y descanse.  Haga actividad fsica.  Consuma una dieta que contenga calcio (bueno para los [place]) y productos derivados de la soja (actan como estrgenos).  No fume.  Evite las bebidas alcohlicas.  Tome los suplementos vitamnicos segn las indicaciones del mdico. En ciertos casos, puede ser de [place] tomar vitamina E.  Tome suplementos de calcio y vitamina D para ayudar a Publishing rights manager prdida sea.  En algunos casos la terapia de grupo podr ayudarla.  La acupuntura puede ser de ayuda en ciertos casos. SOLICITE ATENCIN MDICA SI:   Tiene preguntas acerca de sus sntomas.  Necesita ser derivada a un especialista (gineclogo, psiquiatra, o psiclogo). SOLICITE ATENCIN MDICA DE INMEDIATO SI:   Sufre una hemorragia vaginal abundante.  Su perodo menstrual dura ms de 8 das.  Sus perodos son recurrentes cada menos de [address]  Tiene hemorragias durante las [employer].  Est muy deprimido.  Siente dolor al [employer].  Siente dolor de cabeza intenso.  Tiene problemas de visin. Document Released: 06/29/2005 Document Revised: 04/19/2013 [facility] Patient Information 2015 [place], [place]. This information is not intended to replace advice given to you  by your health care provider. Make sure you discuss any questions you have with your health care provider. Ecografa transvaginal (Transvaginal Ultrasound) La ecografa transvaginal es una ecografa plvica en la que se utiliza una probeta metlica que se coloca en la vagina, para observar los rganos femeninos. El ecgrafo enva ondas sonoras desde un transductor (sonda). Estas ondas sonoras chocan contra las estructuras del cuerpo (como un eco) y crean Proofreader. La imagen se observa en un monitor. Se denomina transvaginal debido a que la sonda se inserta dentro de la vagina. Puede haber una pequea molestia por la introduccin de la sonda. Esta prueba tambin puede realizarse [employer]. La ecografa endovaginal es otro nombre para la ecografa transvaginal. En una ecografa transabdominal, la sonda se coloca en la parte externa del abdomen. Este mtodo no ofrece imgenes tan buenas como la tcnica transvaginal. La ecogafa transvaginal se utiliza para observar alteraciones en el tracto genital femenino. Entre ellos se incluyen:  Problemas de infertilidad.  Malformaciones congnitas (defecto de nacimiento) del tero y los ovarios.  Tumores en el tero.  Hemorragias anormales.  Tumores y quistes de ovario.  Abscesos (tejidos inflamados y pus) en la pelvis.  Dolor abdominal o plvico sin causa aparente.  Infecciones plvicas. DURANTE EL EMBARAZO, SE UTILIZA PAR OBSERVAR:  Embarazos normales.  Un embarazo ectpico (embarazo fuera del tero).  Latidos cardacos fetales.  Anormalidades de la pelvis que no se observan bien con la ecografa transabdominal.  Sospecha de gemelos o embarazo mltiple.  Aborto inminente.  Problemas en el cuello del tero (cuello incompetente, no permanece cerrado para contener al beb).  Cuando se realiza una amniocentesis (se retira lquido de la [place], para ser [place]).  Al buscar anormalidades en el beb.  Para controlar el  crecimiento, el desarrollo y la edad del feto.  Para medir la cantidad de lquido en el saco amnitico.  Cuando se realiza una versin externa del beb (se lo mueve a Programmer, applications).  Evaluar al beb en embarazos de alto riesgo (perfil biofsico).  Si se sospecha el deceso del beb (muerte). En algunos casos, se utiliza un mtodo especial denominado ecografa con infusin salina, para una observacin ms precisa del tero. Se inyecta solucin salina (agua con sal) dentro del tero en pacientes no embarazadas para observar mejor su interior. Este mtodo no se Designer, fashion/clothing. La probeta tambin puede usarse para obtener biopsias de Educational psychologist, para drenar lquido de quistes de ovario y para Designer, jewellery un DIU (dispositivo intrauterino para el control de la natalidad) que no pueda [place]. PREPARACIN PARA LA PRUEBA La ecografa transvaginal se realiza con la vejiga vaca. La ecografa transabdominal se realiza con la vejiga llena. Podrn solicitarle que beba varios vasos de agua antes del examen. En algunos casos se realiza una ecografa transabdominal antes de la ecografa transvaginal para obervar los rganos del abdomen. PROCEDIMIENTO  Deber acostarse en una cama, con las rodillas dobladas y los pies en los estribos. La probeta se cubre con un condn. Dentro de la vagina y en la probeta se aplica un lubricante estril. El lubricante ayuda a transmitir las ondas sonoras y Fish farm manager la irritacin de la vagina. El mdico mover la sonda en el interior de la cavidad vaginal para escanear las estructuras plvicas. Un examen normal mostrar una pelvis normal y contenidos normales en su interior. Una prueba anormal mostrar anormalidades en la pelvis, la placenta o el beb. LAS CAUSAS DE UN RESULTADO ANORMAL PUEDEN SER:  Crecimientos o tumores  en:  El tero.  Los ovarios.  La vagina.  Otras estructuras plvicas.  Crecimientos no cancerosos en el tero y los ovarios.  El  ovario se retuerce y se corta el suministro de Carma Lair (torsin [place]).  Las reas de infeccin incluyen:  Enfermedad inflamatoria plvica.  Un absceso en la pelvis.  Ubicacin de un DIU. LOS PROBLEMAS QUE PUEDEN HALLARSE EN UNA MUJER EMBARAZADA SON:  Embarazo ectpico (embarazo fuera del tero).  Embarazos mltiples.  Dilatacin (apertura) precoz anormal del cuello del tero. Esto puede indicar un cuello incompetente y Biomedical scientist.  Aborto inminente.  Muerte fetal.  Los problemas con la placenta incluyen:  La placenta se ha desarrollado sobre la abertura del cuello del tero (placenta previa).  La placenta se ha separado anticipadamente en el tero (abrupcin placentaria).  La placenta se desarrolla en el msculo del tero (placenta acreta).  Tumores del Media planner, incluyendo la enfermedad trofoblstica gestacional. Se trata de un embarazo anormal en el que no hay feto. El tero se llena de quistes similares a uvas que en algunos casos son cancerosos.  Posicin incorrecta del feto (de nalgas, de vrtice).  Retraso del desarrollo fetal intrauterino (escaso desarrollo en el tero).  Anormalidades o infeccin fetal. RIESGOS Y COMPLICACIONES No hay riesgos conocidos para la ecografa. No se toman radiografas cuando se realiza una ecografa. Document Released: 10/15/2008 Document Revised: 09/21/2011 [facility] Patient Information 2015 [place]. This information is not intended to replace advice given to you by your health care provider. Make sure you discuss any questions you have with your health care provider.

## 2014-02-20 LAB — FOLLICLE STIMULATING HORMONE: FSH: 10.3 m[IU]/mL

## 2014-02-20 LAB — TSH: TSH: 1.165 u[IU]/mL (ref 0.350–4.500)

## 2014-03-01 ENCOUNTER — Other Ambulatory Visit: Payer: Self-pay | Admitting: Gynecology

## 2014-03-01 DIAGNOSIS — N923 Ovulation bleeding: Secondary | ICD-10-CM

## 2014-03-15 ENCOUNTER — Encounter (INDEPENDENT_AMBULATORY_CARE_PROVIDER_SITE_OTHER): Payer: BC Managed Care – PPO | Admitting: General Surgery

## 2014-03-15 ENCOUNTER — Telehealth (INDEPENDENT_AMBULATORY_CARE_PROVIDER_SITE_OTHER): Payer: Self-pay

## 2014-03-15 DIAGNOSIS — Z1239 Encounter for other screening for malignant neoplasm of breast: Secondary | ICD-10-CM

## 2014-03-15 NOTE — Telephone Encounter (Signed)
Dg mgm for yearly screening order placed in epic for [facility] to [facility]. Order take to scheduler.

## 2014-03-16 ENCOUNTER — Ambulatory Visit: Payer: BC Managed Care – PPO | Admitting: Gynecology

## 2014-03-16 ENCOUNTER — Other Ambulatory Visit: Payer: BC Managed Care – PPO

## 2014-03-26 ENCOUNTER — Other Ambulatory Visit (INDEPENDENT_AMBULATORY_CARE_PROVIDER_SITE_OTHER): Payer: Self-pay | Admitting: General Surgery

## 2014-03-26 ENCOUNTER — Ambulatory Visit (INDEPENDENT_AMBULATORY_CARE_PROVIDER_SITE_OTHER): Payer: BC Managed Care – PPO | Admitting: Gynecology

## 2014-03-26 ENCOUNTER — Other Ambulatory Visit: Payer: Self-pay | Admitting: Gynecology

## 2014-03-26 ENCOUNTER — Ambulatory Visit (INDEPENDENT_AMBULATORY_CARE_PROVIDER_SITE_OTHER): Payer: BC Managed Care – PPO

## 2014-03-26 ENCOUNTER — Ambulatory Visit
Admission: RE | Admit: 2014-03-26 | Discharge: 2014-03-26 | Disposition: A | Discharge status: Home / Self Care | Payer: BC Managed Care – PPO | Source: Ambulatory Visit | Attending: General Surgery | Admitting: General Surgery

## 2014-03-26 ENCOUNTER — Telehealth: Payer: Self-pay | Admitting: *Deleted

## 2014-03-26 DIAGNOSIS — N920 Excessive and frequent menstruation with regular cycle: Secondary | ICD-10-CM

## 2014-03-26 DIAGNOSIS — D259 Leiomyoma of uterus, unspecified: Secondary | ICD-10-CM

## 2014-03-26 DIAGNOSIS — N923 Ovulation bleeding: Secondary | ICD-10-CM

## 2014-03-26 DIAGNOSIS — N83209 Unspecified ovarian cyst, unspecified side: Secondary | ICD-10-CM

## 2014-03-26 DIAGNOSIS — N852 Hypertrophy of uterus: Secondary | ICD-10-CM

## 2014-03-26 DIAGNOSIS — N921 Excessive and frequent menstruation with irregular cycle: Secondary | ICD-10-CM

## 2014-03-26 DIAGNOSIS — Z23 Encounter for immunization: Secondary | ICD-10-CM

## 2014-03-26 DIAGNOSIS — Q51810 Arcuate uterus: Secondary | ICD-10-CM

## 2014-03-26 DIAGNOSIS — Z1239 Encounter for other screening for malignant neoplasm of breast: Secondary | ICD-10-CM

## 2014-03-26 DIAGNOSIS — N840 Polyp of corpus uteri: Secondary | ICD-10-CM | POA: Insufficient documentation

## 2014-03-26 NOTE — Progress Notes (Addendum)
   Patient presented to the office today for further evaluation of her intermenstrual bleeding. Patient states this has been going on for quite some time. Earlier this year she had an ultrasound which demonstrated the following:  Anteverted arcuate versus subseptate uterus with tri layered endometrium. Right ovary normal. Left ovary thickwalled involuting cyst 22 x 17 x 60 mm with positive CFD on the periphery. Negative cul-de-sac. Endometrium 11 mm.  Patient has been followed by Dr. Donne Hazel and had a recent left breast subareolar duct excision which demonstrated benign intraductal papilloma.  Patient had a normal TSH and prolactin which she was seen in the office on August 10. She did mention that she went to her primary care physician who has recommended that she see a vascular surgeon as a result of the femoral bruits as well as pain  and swelling of lower extremities and tenderness over the spleen and stomach?  Ultrasound today and sonohysterogram as follows: Uterus measuring 9.2 x 7.0 x 4.2 cm. A small fibroid 13 x 40 mm was noted. There appears to be an arcuate uterus. Right ovary normal. Left ovary is corpus luteum cyst measuring 25 x 18 mm with color flow Doppler the periphery. There was no fluid in the cul-de-sac. The cervix was cleansed with Betadine solution and a sterile catheter was introduced into the uterine cavity and normal saline was instilled. It was noted a left posterior polyp measured 15 x 7 x 6 mm was noted. An endometrial biopsy was obtained. The catheter was reinserted and the polyp was still present.  Assessment/plan: Patient with intermenstrual spotting with evidence of an endometrial polyp will be scheduled for sometime in October for resectoscopic polypectomy. We will need to see her 1 week before for preoperative consultation examination. Literature information was provided. She is going to be referred to the vascular surgeon for further evaluation of femoral bruits and  pain and swelling of the lower extremities as part of her preop evaluation for clearance. The patient received the flu vaccine today.

## 2014-03-26 NOTE — Telephone Encounter (Signed)
Message copied by Thamas Jaegers on Mon Mar 26, 2014  3:54 PM ------      Message from: Ramond Craver      Created: Mon Mar 26, 2014  3:39 PM      Regarding: referral       Anderson Malta, please schedule the following:       #1 appointment to see vascular surgeon as a result of the femoral bruits and swelling and pain for extremities and pain in the region of spleen and stomach as reported by her PCP. We will need this cleared before her surgery.            (Jamera Vanloan, I will not schedule surgery until I know when appt is/ka) ------

## 2014-03-26 NOTE — Telephone Encounter (Signed)
Notes faxed to [facility] and Vein Specialist, will wait for appointment time and date.

## 2014-03-28 NOTE — Telephone Encounter (Signed)
Appointment on 04/25/14 @ 10:00am with Dr.Dickson, Rosemarie Ax will relay time and date to pt.

## 2014-03-30 ENCOUNTER — Encounter: Payer: Self-pay | Admitting: Women's Health

## 2014-03-30 ENCOUNTER — Ambulatory Visit (INDEPENDENT_AMBULATORY_CARE_PROVIDER_SITE_OTHER): Payer: BC Managed Care – PPO | Admitting: Women's Health

## 2014-03-30 DIAGNOSIS — N898 Other specified noninflammatory disorders of vagina: Secondary | ICD-10-CM

## 2014-03-30 DIAGNOSIS — R1031 Right lower quadrant pain: Secondary | ICD-10-CM

## 2014-03-30 LAB — URINALYSIS W MICROSCOPIC + REFLEX CULTURE
Bilirubin Urine: NEGATIVE
Casts: NONE SEEN
Crystals: NONE SEEN
Glucose, UA: NEGATIVE mg/dL
Ketones, ur: NEGATIVE mg/dL
Nitrite: NEGATIVE
Protein, ur: NEGATIVE mg/dL
Specific Gravity, Urine: 1.015 (ref 1.005–1.030)
Urobilinogen, UA: 0.2 mg/dL (ref 0.0–1.0)
pH: 5.5 (ref 5.0–8.0)

## 2014-03-30 LAB — WET PREP FOR TRICH, YEAST, CLUE
Clue Cells Wet Prep HPF POC: NONE SEEN
Trich, Wet Prep: NONE SEEN
Yeast Wet Prep HPF POC: NONE SEEN

## 2014-03-30 MED ORDER — DOXYCYCLINE HYCLATE 100 MG PO CAPS: 100.0000 mg | ORAL_CAPSULE | Freq: Two times a day (BID) | ORAL | Status: DC

## 2014-03-30 NOTE — Progress Notes (Signed)
Patient ID: Taylor Parker, female   DOB: [birth year], 48 y.o.   MRN: [id number] Presents with complaint of right lower quadrant discomfort that radiates down right leg, bloody watery discharge, since after sonohysterogram on 03/26/2014. Denies fever, urinary symptoms, vaginal itching or odor. Endometrial polyp, hysteroscope scheduled for October 29 with Dr. Toney Rakes. Fibroid 13 x 40 mm. Normal TSH and prolactin. BTL. Blanca interpreted Spanish-speaking.  Exam: Appears well. Abdomen soft no rebound or radiation of pain, mild discomfort right lower quadrant. External genitalia within normal limits, speculum exam scant amount of a watery bloody discharge noted with no odor, wet prep negative. Bimanual mild CMT, tenderness in right lower quadrant, no fullness noted, no left-sided discomfort. UA: Small but, trace leukocytes, 3-6 WBCs, 3-6 rbc's, few bacteria.  Questionable endometriosis Endometrial polyp  Plan: Doxycycline 100 mg twice daily for 7 days, prescription, proper use given and reviewed. Instructed to call if no relief of pain in 2 days. Keep scheduled followup with Dr. Toney Rakes.

## 2014-03-31 LAB — URINE CULTURE: Colony Count: 35000

## 2014-04-03 ENCOUNTER — Other Ambulatory Visit: Payer: Self-pay

## 2014-04-03 DIAGNOSIS — R0989 Other specified symptoms and signs involving the circulatory and respiratory systems: Secondary | ICD-10-CM

## 2014-04-03 DIAGNOSIS — M79609 Pain in unspecified limb: Secondary | ICD-10-CM

## 2014-04-03 DIAGNOSIS — M7989 Other specified soft tissue disorders: Secondary | ICD-10-CM

## 2014-04-17 ENCOUNTER — Ambulatory Visit (INDEPENDENT_AMBULATORY_CARE_PROVIDER_SITE_OTHER): Payer: BC Managed Care – PPO | Admitting: Gynecology

## 2014-04-17 ENCOUNTER — Encounter: Payer: Self-pay | Admitting: Gynecology

## 2014-04-17 VITALS — BP 118/80

## 2014-04-17 DIAGNOSIS — N898 Other specified noninflammatory disorders of vagina: Secondary | ICD-10-CM

## 2014-04-17 DIAGNOSIS — Z113 Encounter for screening for infections with a predominantly sexual mode of transmission: Secondary | ICD-10-CM

## 2014-04-17 LAB — WET PREP FOR TRICH, YEAST, CLUE
Clue Cells Wet Prep HPF POC: NONE SEEN
Trich, Wet Prep: NONE SEEN
WBC, Wet Prep HPF POC: NONE SEEN
Yeast Wet Prep HPF POC: NONE SEEN

## 2014-04-17 MED ORDER — FLUCONAZOLE 150 MG PO TABS: ORAL_TABLET | ORAL | Status: DC

## 2014-04-17 MED ORDER — CLINDAMYCIN PHOSPHATE 2 % VA CREA: 1.0000 | TOPICAL_CREAM | Freq: Every day | VAGINAL | Status: DC

## 2014-04-17 NOTE — Progress Notes (Signed)
   Patient presented to the office today complaining of vulvar pruritus and also stating that she had a slight pinkish discharge which started 3 days ago. She is a monogamous relationship. She is scheduled at the end of the month to undergo resectoscopic polypectomy. She had been seen in the office by her nurse practitioner September 18 and was treated with Vibramycin 100 mg twice a day for 7 days due to the fact and she was having some abdominal discomfort in the event of a mild PID versus early UTI. Patient has no abdominal pelvic pains today.  Exam: Back: No CVA tenderness Abdomen: Soft nontender no rebound or guarding Pelvic: Bartholin urethra Skene was within normal limits Vagina: No gross lesions or discharge Cervix: No lesions or discharge Bimanual exam unremarkable Adnexa: Unremarkable Rectal exam not done  Wet prep few bacteria noted  Assessment/plan: GC and chlamydia culture was obtained today. In the event of a yeast infection external genitalia that was not detected on vaginal swab I am going to prescribe her Diflucan 150 mg one by mouth today. She also prescribed Cleocin vaginal cream to apply each bedtime for one week and we will see her due to the month for plan resectoscopic polypectomy. Her symptoms may be attributed to endometrial polyp.

## 2014-04-18 LAB — GC/CHLAMYDIA PROBE AMP
CT Probe RNA: NEGATIVE
GC Probe RNA: NEGATIVE

## 2014-04-24 ENCOUNTER — Encounter: Payer: Self-pay | Admitting: Vascular Surgery

## 2014-04-25 ENCOUNTER — Ambulatory Visit (INDEPENDENT_AMBULATORY_CARE_PROVIDER_SITE_OTHER)
Admission: RE | Admit: 2014-04-25 | Discharge: 2014-04-25 | Disposition: A | Discharge status: Home / Self Care | Payer: BC Managed Care – PPO | Source: Ambulatory Visit | Attending: Vascular Surgery | Admitting: Vascular Surgery

## 2014-04-25 ENCOUNTER — Ambulatory Visit (HOSPITAL_COMMUNITY)
Admission: RE | Admit: 2014-04-25 | Discharge: 2014-04-25 | Disposition: A | Discharge status: Home / Self Care | Payer: BC Managed Care – PPO | Source: Ambulatory Visit | Attending: Vascular Surgery | Admitting: Vascular Surgery

## 2014-04-25 ENCOUNTER — Encounter: Payer: Self-pay | Admitting: Vascular Surgery

## 2014-04-25 ENCOUNTER — Other Ambulatory Visit: Payer: Self-pay | Admitting: Vascular Surgery

## 2014-04-25 ENCOUNTER — Ambulatory Visit (INDEPENDENT_AMBULATORY_CARE_PROVIDER_SITE_OTHER): Payer: BC Managed Care – PPO | Admitting: Vascular Surgery

## 2014-04-25 ENCOUNTER — Ambulatory Visit (INDEPENDENT_AMBULATORY_CARE_PROVIDER_SITE_OTHER)
Admission: RE | Admit: 2014-04-25 | Discharge: 2014-04-25 | Disposition: A | Discharge status: Home / Self Care | Payer: BC Managed Care – PPO | Source: Ambulatory Visit

## 2014-04-25 VITALS — BP 95/57 | HR 65 | Temp 98.0°F | Resp 14 | Ht 62.0 in | Wt 159.0 lb

## 2014-04-25 DIAGNOSIS — M79604 Pain in right leg: Secondary | ICD-10-CM | POA: Insufficient documentation

## 2014-04-25 DIAGNOSIS — M7989 Other specified soft tissue disorders: Secondary | ICD-10-CM | POA: Insufficient documentation

## 2014-04-25 DIAGNOSIS — I83819 Varicose veins of unspecified lower extremities with pain: Secondary | ICD-10-CM | POA: Insufficient documentation

## 2014-04-25 DIAGNOSIS — R0989 Other specified symptoms and signs involving the circulatory and respiratory systems: Secondary | ICD-10-CM

## 2014-04-25 DIAGNOSIS — M79609 Pain in unspecified limb: Secondary | ICD-10-CM

## 2014-04-25 DIAGNOSIS — M79605 Pain in left leg: Secondary | ICD-10-CM | POA: Insufficient documentation

## 2014-04-25 DIAGNOSIS — M79606 Pain in leg, unspecified: Secondary | ICD-10-CM | POA: Diagnosis present

## 2014-04-25 DIAGNOSIS — R52 Pain, unspecified: Secondary | ICD-10-CM

## 2014-04-25 DIAGNOSIS — I83813 Varicose veins of bilateral lower extremities with pain: Secondary | ICD-10-CM

## 2014-04-25 DIAGNOSIS — I872 Venous insufficiency (chronic) (peripheral): Secondary | ICD-10-CM

## 2014-04-25 NOTE — Progress Notes (Signed)
Patient ID: Taylor Parker, female   DOB: 20-Jan-1966, 48 y.o.   MRN: [id number]  Reason for Consult: Venous Insufficiency and Varicose Veins   Referred by Terrance Mass, MD  Subjective:     HPI:  Taylor Parker is a 48 y.o. female who presents with bilateral leg pain. Her history is obtained through her translator. She has a long history of pain in both lower extremities. She does have a history of varicose veins and varicose veins stripped from both lower extremities in [place] approximately 20 years ago. She is unaware of any history of DVT. She experiences pain in her left calf which is worsened by standing. Also has some pain in her right lateral thigh. In addition she has right knee pain.  I do not get any history consistent with claudication or rest pain.  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 Her legs ache when she is at work.  I reviewed her records from [facility]. She has had some issues with intermenstrual bleeding. It was during that exam that she was noted to have femoral bruits.  Past Medical History  Diagnosis Date  . Depression   . PMDD (premenstrual dysphoric disorder)   . Hyperlipidemia   . PONV (postoperative nausea and vomiting)     in the past  . Headache(784.0)    Family History  Problem Relation Age of Onset  . Heart disease Mother    Past Surgical History  Procedure Laterality Date  . Cesarean section      X 3  . Tubal ligation  2004  . Incise and drain abcess      right breast  . Breast surgery  8/12    right breast biopsy  . Breast biopsy  06/19/2011    Procedure: BREAST BIOPSY;  Surgeon: Rolm Bookbinder, MD;  Location: [place];  Service: General;  Laterality: Right;  Right breast chronic abscess drainage and debridement  . Breast ductal system excision Left 01/24/2014    Procedure:  LEFT BREAST DUCT EXCISION;  Surgeon: Rolm Bookbinder, MD;  Location: [place];  Service:  General;  Laterality: Left;   Short Social History:  History  Substance Use Topics  . Smoking status: Never Smoker   . Smokeless tobacco: Never Used  . Alcohol Use: No     Comment: social   No Known Allergies  Current Outpatient Prescriptions  Medication Sig Dispense Refill  . clindamycin (CLEOCIN) 2 % vaginal cream Place 1 Applicatorful vaginally at bedtime.  40 g  0  . doxycycline (VIBRAMYCIN) 100 MG capsule Take 1 capsule (100 mg total) by mouth 2 (two) times daily.  14 capsule  0  . fluconazole (DIFLUCAN) 150 MG tablet Take one tablet today repeat in 48 hours  2 tablet  0  . naproxen sodium (PAMPRIN ALL DAY RELIEF MAX ST) 220 MG tablet Take 220 mg by mouth 2 (two) times daily with a meal.      . oxyCODONE-acetaminophen (PERCOCET) 10-325 MG per tablet Take 1 tablet by mouth every 6 (six) hours as needed for pain.  20 tablet  0   No current facility-administered medications for this visit.    Review of Systems  Constitutional: Negative for chills and fever.  Eyes: Negative for loss of vision.  Respiratory: Negative for cough and wheezing.  Cardiovascular: Negative for chest pain, chest tightness, claudication, dyspnea with exertion, orthopnea and palpitations.  GI: Negative for blood in stool and vomiting.  GU:  Negative for dysuria and hematuria.  Musculoskeletal: Negative for leg pain, joint pain and myalgias.  Skin: Negative for rash and wound.  Neurological: Negative for dizziness and speech difficulty.  Hematologic: Negative for bruises/bleeds easily. Psychiatric: Negative for depressed mood.       Objective:  Objective  Filed Vitals:   04/25/14 1234  BP: 95/57  Pulse: 65  Temp: 98 F (36.7 C)  TempSrc: Oral  Resp: 14  Height: 5\' 2"  (1.575 m)  Weight: 159 lb (72.122 kg)  SpO2: 99%   Body mass index is 29.07 kg/(m^2).  Physical Exam  Constitutional: She is oriented to person, place, and time. She appears well-developed and well-nourished.  HENT:  Head:  Normocephalic and atraumatic.  Neck: Neck supple. No JVD present. No thyromegaly present.  Cardiovascular: Normal rate, regular rhythm and normal heart sounds.  Exam reveals no friction rub.   No murmur heard. Pulses:      Femoral pulses are 2+ on the right side, and 2+ on the left side.      Dorsalis pedis pulses are 2+ on the right side, and 2+ on the left side.  I do not detect femoral bruits.  Pulmonary/Chest: Breath sounds normal. She has no wheezes. She has no rales.  Abdominal: Soft. Bowel sounds are normal. There is no tenderness.  Musculoskeletal: Normal range of motion. She exhibits no edema.  Lymphadenopathy:    She has no cervical adenopathy.  Neurological: She is alert and oriented to person, place, and time. She has normal strength. No sensory deficit.  Skin: No lesion and no rash noted.  She does have some small spider veins and telangiectasias bilaterally but no large truncal varicosities appear  Psychiatric: She has a normal mood and affect.   Data: I have independently interpreted her venous duplex scan. On the right side there is no evidence of DVT. There is reflux in the right common femoral vein. There is also some reflux in the greater saphenous vein in the thigh although this does not appear to be severe. On the left side there is no evidence of DVT. There is no deep venous reflux on the left. There is no significant saphenous reflux on the left. There was some mild reflux and the short saphenous veins bilaterally.  I have also independently interpreted her arterial Doppler study which shows triphasic Doppler signals in both feet in the dorsalis pedis and posterior tibial positions bilaterally with normal ABIs of 100%.      Assessment/Plan:     Chronic venous insufficiency This patient does have some deep vein reflux on the right and also some reflux the right greater saphenous vein. To a lesser extent she hasn't venous insufficiency on the left side. We have  discussed the importance of intermittent leg elevation and the proper positioning for this. Also, given that she stands a work for 8 hours, I have written her a prescription for knee-high compression stockings with a gradient of 15-20 mm of mercury. I experience is that tighter stockings are hotter and more difficult to get on and therefore patient's are less compliant with these. However she could be considered for tighter stockings if she can tolerate those. I've also encouraged her to avoid prolonged sitting and standing. I've encouraged her to stay as active as possible. I reassured her that she has no evidence of arterial insufficiency.      Angelia Mould MD [facility]

## 2014-04-25 NOTE — Assessment & Plan Note (Signed)
This patient does have some deep vein reflux on the right and also some reflux the right greater saphenous vein. To a lesser extent she hasn't venous insufficiency on the left side. We have discussed the importance of intermittent leg elevation and the proper positioning for this. Also, given that she stands a work for 8 hours, I have written her a prescription for knee-high compression stockings with a gradient of 15-20 mm of mercury. I experience is that tighter stockings are hotter and more difficult to get on and therefore patient's are less compliant with these. However she could be considered for tighter stockings if she can tolerate those. I've also encouraged her to avoid prolonged sitting and standing. I've encouraged her to stay as active as possible. I reassured her that she has no evidence of arterial insufficiency.

## 2014-04-25 NOTE — Patient Instructions (Signed)
Venas varicosas (Varicose Veins) Las venas varicosas son venas que se han agrandado y se tornan sinuosas.  CAUSAS  Este trastorno es el resultado de un malfuncionamiento de las vlvulas de las venas. Las vlvulas de las venas ayudan al retorno de la sangre desde las piernas hacia el corazn. Si estas vlvulas se daan, el flujo sanguneo retorna hacia atrs y vuelve hacia las venas de la pierna, cerca de la piel. Esto hace que las venas se agranden debido al aumento de la presin en su interior. Es ms probable que desarrollen venas varicosas las personas que estn de pie durante mucho tiempo, las mujeres embarazadas o quienes tienen sobrepeso. SNTOMAS   Venas hinchadas, tortuosas, y azuladas, que se observan ms comnmente en las piernas.  Dolor en las piernas o sensacin de pesadez. Estos sntomas pueden empeorar hacia el final del da.  Hinchazn de las piernas.  Cambios en el color de la piel. DIAGNSTICO  Su mdico puede diagnosticarle venas varicosas con un examen de las piernas. Podr recomendarle una ecografa de las venas de sus piernas.  TRATAMIENTO  La mayora de las venas varicosas pueden tratarse en casa.Sin embargo, existen otros tratamientos para personas con sntomas persistentes o que desean tratar la apariencia de las venas varicosas. Entre estos tratamientos se incluyen:   Tratamiento lser de venas varicosas muy pequeas.  Medicamentos que se inyectan en la vena. Este medicamento endurece las paredes de la vena y la cierra. Se denomina escleroterapia. Luego, deber utilizar ropa o vendajes que apliquen presin.  Ciruga. INSTRUCCIONES PARA EL CUIDADO DOMICILIARIO  No permanezca sentado o de pie en una posicin durante largos perodos. No se siente con las piernas cruzadas. Descanse con las piernas levantadas durante el da.  Use medias elsticas o de soporte. No use prendas apretadas alrededor de las piernas, pelvis o cintura.  Camine todo lo posible para aumentar  el flujo de sangre.  Levante los pies de la cama por la noche, alrededor de 5 cm.  Si se ha cortado la piel que est sobre la vena y sangra, recustese con la pierna elevada y presione con un pao limpio hasta que la sangre se detenga. Luego coloque una venda sobre el corte. Consulte a su mdico si contina sangrando o necesita una sutura. SOLICITE ATENCIN MDICA SI:  La piel que rodea el tobillo comienza a lesionarse.  Presenta dolor, enrojecimiento, sensibilidad o hinchazn en la pierna, sobre la vena.  Siente molestias y dolor en la pierna. Document Released: 04/08/2005 Document Revised: 09/21/2011 ExitCare Patient Information 2015 [employer]. This information is not intended to replace advice given to you by your health care provider. Make sure you discuss any questions you have with your health care provider.  

## 2014-04-26 ENCOUNTER — Encounter (HOSPITAL_COMMUNITY): Payer: Self-pay

## 2014-04-30 ENCOUNTER — Other Ambulatory Visit (HOSPITAL_COMMUNITY)
Admission: RE | Admit: 2014-04-30 | Discharge: 2014-04-30 | Disposition: A | Discharge status: Home / Self Care | Payer: BC Managed Care – PPO | Source: Ambulatory Visit | Attending: Gynecology | Admitting: Gynecology

## 2014-04-30 ENCOUNTER — Encounter: Payer: Self-pay | Admitting: Gynecology

## 2014-04-30 ENCOUNTER — Ambulatory Visit (INDEPENDENT_AMBULATORY_CARE_PROVIDER_SITE_OTHER): Payer: BC Managed Care – PPO | Admitting: Gynecology

## 2014-04-30 VITALS — BP 106/72 | Ht 62.5 in | Wt 157.0 lb

## 2014-04-30 DIAGNOSIS — Z01419 Encounter for gynecological examination (general) (routine) without abnormal findings: Secondary | ICD-10-CM | POA: Insufficient documentation

## 2014-04-30 DIAGNOSIS — Z1151 Encounter for screening for human papillomavirus (HPV): Secondary | ICD-10-CM | POA: Insufficient documentation

## 2014-04-30 LAB — CBC WITH DIFFERENTIAL/PLATELET
Basophils Absolute: 0 10*3/uL (ref 0.0–0.1)
Basophils Relative: 1 % (ref 0–1)
Eosinophils Absolute: 0 10*3/uL (ref 0.0–0.7)
Eosinophils Relative: 1 % (ref 0–5)
HCT: 38.6 % (ref 36.0–46.0)
Hemoglobin: 12.7 g/dL (ref 12.0–15.0)
Lymphocytes Relative: 48 % — ABNORMAL HIGH (ref 12–46)
Lymphs Abs: 2.2 10*3/uL (ref 0.7–4.0)
MCH: 27.1 pg (ref 26.0–34.0)
MCHC: 32.9 g/dL (ref 30.0–36.0)
MCV: 82.5 fL (ref 78.0–100.0)
Monocytes Absolute: 0.3 10*3/uL (ref 0.1–1.0)
Monocytes Relative: 7 % (ref 3–12)
Neutro Abs: 1.9 10*3/uL (ref 1.7–7.7)
Neutrophils Relative %: 43 % (ref 43–77)
Platelets: 252 10*3/uL (ref 150–400)
RBC: 4.68 MIL/uL (ref 3.87–5.11)
RDW: 15.5 % (ref 11.5–15.5)
WBC: 4.5 10*3/uL (ref 4.0–10.5)

## 2014-04-30 LAB — COMPREHENSIVE METABOLIC PANEL
ALT: 10 U/L (ref 0–35)
AST: 14 U/L (ref 0–37)
Albumin: 3.8 g/dL (ref 3.5–5.2)
Alkaline Phosphatase: 67 U/L (ref 39–117)
BUN: 14 mg/dL (ref 6–23)
CO2: 26 mEq/L (ref 19–32)
Calcium: 8.5 mg/dL (ref 8.4–10.5)
Chloride: 107 mEq/L (ref 96–112)
Creat: 0.75 mg/dL (ref 0.50–1.10)
Glucose, Bld: 83 mg/dL (ref 70–99)
Potassium: 4.5 mEq/L (ref 3.5–5.3)
Sodium: 140 mEq/L (ref 135–145)
Total Bilirubin: 0.4 mg/dL (ref 0.2–1.2)
Total Protein: 6.6 g/dL (ref 6.0–8.3)

## 2014-04-30 LAB — LIPID PANEL
Cholesterol: 221 mg/dL — ABNORMAL HIGH (ref 0–200)
HDL: 47 mg/dL (ref >39)
LDL Cholesterol: 133 mg/dL — ABNORMAL HIGH (ref 0–99)
Total CHOL/HDL Ratio: 4.7 Ratio
Triglycerides: 206 mg/dL — ABNORMAL HIGH (ref <150)
VLDL: 41 mg/dL — ABNORMAL HIGH (ref 0–40)

## 2014-04-30 LAB — TSH: TSH: 1.236 u[IU]/mL (ref 0.350–4.500)

## 2014-04-30 NOTE — Patient Instructions (Signed)
Histeroscopa (Hysteroscopy) La histeroscopa es un procedimiento que se [place] para observar el interior de la matriz (tero) Puede indicarse por diferentes motivos, entre ellos:  Para evaluar una hemorragia anormal, un fibroma (tumor benigno, no canceroso), tumores, plipos o tejido cicatrizal Sport and exercise psychologist) y la posibilidad de cncer de tero.  Para detectar bultos (tumores) y otros crecimientos anormales uterinos.  Para buscar las causas por las que una mujer no queda embarazada (infertilidad) causas recurrentes de prdida de embarazo (abortos espontneos) o por la prdida de un dispositivo intrauterino (DIU).  Para realizar un procedimiento de esterilizacin cerrando las trompas de [place] desde adentro del tero. En este procedimiento, se coloca un tubo delgado y luminoso, con una cmara en el extremo (histeroscopio)para observar el interior del tero. La histeroscopa se realiza inmediatamente despus del perodo menstrual para asegurarse de que no existe embarazo. INFORME A SU MDICO:   Cualquier alergia que tenga.  Todos los [employer] [place], incluyendo vitaminas, hierbas, gotas oftlmicas, cremas y medicamentos de venta libre.  Problemas previos que usted o los [employer] de su familia hayan tenido con el uso de anestsicos.  Enfermedades de [employer].  Cirugas previas.  Padecimientos mdicos. RIESGOS Y COMPLICACIONES  Generalmente es un procedimiento seguro. Sin embargo, Games developer procedimiento, pueden surgir complicaciones. Las complicaciones posibles son:  Perforacin del tero.  Sangrado excesivo.  Infeccin.  Lesin en el cuello del tero.  Lesiones en otros rganos.  Reaccin alrgica a un medicamento.  Inoculacin de lquido en exceso dentro del tero. ANTES DEL PROCEDIMIENTO   Consulte a su mdico si debe cambiar o suspender los medicamentos que toma habitualmente.  No tome aspirina ni anticoagulantes durante la semana previa al  procedimiento, o segn le hayan indicado. Pueden ocasionar hemorragias.  Si fuma, no lo haga durante las [employer] al procedimiento.  En algunos casos, el da anterior al procedimiento se coloca un medicamento en el cuello del tero. Este medicamento dilata el cuello y [place] la abertura. Esto facilita la insercin del instrumento en el tero durante el procedimiento.  No debe comer ni beber nada durante al menos 8 horas antes de la [place].  Pdale a alguna persona que la lleve a su casa luego del procedimiento. PROCEDIMIENTO   Le administrarn un medicamento para relajarse (sedante). Tambin podrn administrarle uno de los siguientes medicamentos:  Medicamentos que adormecen el rea del cuello uterino (anestesia local).  Un medicamento para que duerma durante el procedimiento (anestesia genera).  El histeroscopio se inserta a travs de la vagina, dentro del tero. La cmara del laparoscopio enva una imagen a una pantalla de televisin que se encuentra en el quirfano. [employer] modo, el mdico tendr Ardelia Mems buena visin del interior del tero.  Durante el Wellsite geologist un lquido o aire en el interior de [place], lo que le permitir al cirujano observar mejor.  En algunas ocasiones, el tejido del interior del tero se legra suavemente. Esas muestras de tejido se envan al laboratorio para ser examinadas. DESPUS DEL PROCEDIMIENTO   Si le han administrado anestesia general podr sentirse atontada durante algunas horas despus del procedimiento.  Si se utiliza Tour manager, podr volver a su casa tan pronto como est estable y se sienta en condiciones.  Podr sentir clicos leves. Es normal que esto dure un par [employer].  Podr tener una hemorragia, la que puede variar entre una pequea mancha durante algunos das hasta una hemorragia similar a Mining engineer durante 3-7 das. Esto es normal.  Si el  resultado de los estudios no estn listos durante la  visita, tenga otra entrevista con su mdico para conocerlos. Document Released: 06/29/2005 Document Revised: 04/19/2013 [facility] Patient Information 2015 [place], [place]. This information is not intended to replace advice given to you by your health care provider. Make sure you discuss any questions you have with your health care provider.

## 2014-04-30 NOTE — Addendum Note (Signed)
Addended by: Thurnell Garbe A on: 04/30/2014 12:21 PM   Modules accepted: Orders

## 2014-04-30 NOTE — Progress Notes (Signed)
Taylor Parker is an 48 y.o. female presented to office today for preop consult. She is scheduled for resectoscopic polypectomy 29th of this month due to intermenstrual bleeding as a result of endometrial polyp.  Ultrasound and sonohysterogram on 03/26/2014: Ultrasound today and sonohysterogram as follows:  Uterus measuring 9.2 x 7.0 x 4.2 cm. A small fibroid 13 x 40 mm was noted. There appears to be an arcuate uterus. Right ovary normal. Left ovary is corpus luteum cyst measuring 25 x 18 mm with color flow Doppler the periphery. There was no fluid in the cul-de-sac. The cervix was cleansed with Betadine solution and a sterile catheter was introduced into the uterine cavity and normal saline was instilled. It was noted a left posterior polyp measured 15 x 7 x 6 mm was noted. An endometrial biopsy was obtained. The catheter was reinserted and the polyp was still present.  Endometrial biopsy 03/26/2014: Diagnosis Endometrium, biopsy, uterus - BENIGN PROLIFERATIVE ENDOMETRIUM. - NO ATYPIA, HYPERPLASIA OR MALIGNANCY.  Patient is status post left breast subareolar duct excision by Dr. Donne Hazel and pathology report had demonstrated:  Breast, excision, Left breast duct system  - BENIGN INTRADUCTAL PAPILLOMA   Pertinent Gynecological History: Menses: intermenstrual bleeding Bleeding: intermenstrual bleeding Contraception: tubal ligation DES exposure: denies Blood transfusions: none Sexually transmitted diseases: no past history Previous GYN Procedures: BTSP, 3 csections  Last mammogram: 2015 Date: normal  Last pap: normal Date: 2012 OB History: G3, P3   Menstrual History: Menarche age: 41  Patient's last menstrual period was 04/05/2014. Period Duration (Days): 4-5 Period Pattern: Regular Menstrual Flow: Moderate;Heavy Menstrual Control: Maxi pad Dysmenorrhea: None  Past Medical History  Diagnosis Date  . Depression   . PMDD (premenstrual dysphoric disorder)   .  Hyperlipidemia   . PONV (postoperative nausea and vomiting)     in the past  . Headache(784.0)     Past Surgical History  Procedure Laterality Date  . Cesarean section      X 3  . Tubal ligation  2004  . Incise and drain abcess      right breast  . Breast surgery  8/12    right breast biopsy  . Breast biopsy  06/19/2011    Procedure: BREAST BIOPSY;  Surgeon: Rolm Bookbinder, MD;  Location: [place];  Service: General;  Laterality: Right;  Right breast chronic abscess drainage and debridement  . Breast ductal system excision Left 01/24/2014    Procedure:  LEFT BREAST DUCT EXCISION;  Surgeon: Rolm Bookbinder, MD;  [facility];  Service: General;  Laterality: Left;    Family History  Problem Relation Age of Onset  . Heart disease Mother     Social History:  reports that she has never smoked. She has never used smokeless tobacco. She reports that she does not drink alcohol or use illicit drugs.  Allergies: No Known Allergies   (Not in a hospital admission)  REVIEW OF SYSTEMS: A ROS was performed and pertinent positives and negatives are included in the history.  GENERAL: No fevers or chills. HEENT: No change in vision, no earache, sore throat or sinus congestion. NECK: No pain or stiffness. CARDIOVASCULAR: No chest pain or pressure. No palpitations. PULMONARY: No shortness of breath, cough or wheeze. GASTROINTESTINAL: No abdominal pain, nausea, vomiting or diarrhea, melena or bright red blood per rectum. GENITOURINARY: No urinary frequency, urgency, hesitancy or dysuria. MUSCULOSKELETAL: No joint or muscle pain, no back pain, no recent trauma. DERMATOLOGIC: No rash, no itching, no lesions. ENDOCRINE: No  polyuria, polydipsia, no heat or cold intolerance. No recent change in weight. HEMATOLOGICAL: No anemia or easy bruising or bleeding. NEUROLOGIC: No headache, seizures, numbness, tingling or weakness. PSYCHIATRIC: No depression, no loss of interest in normal  activity or change in sleep pattern.     Blood pressure 106/72, height 5' 2.5" (1.588 m), weight 157 lb (71.215 kg), last menstrual period 04/05/2014.  Physical Exam:  HEENT:unremarkable Neck:Supple, midline, no thyroid megaly, no carotid bruits Lungs:  Clear to auscultation no rhonchi's or wheezes Heart:Regular rate and rhythm, no murmurs or gallops Breast Exam: Both breasts were examined sitting supine position were symmetrical in appearance no skin discoloration or nipple inversion no palpable masses or tenderness Abdomen: Soft nontender no rebound guarding Pelvic:BUS within normal limits Vagina: No lesions or discharge Cervix: No lesions or discharge Uterus: Anteverted normal size shape and consistency Adnexa: No palpable masses or tenderness Extremities: No cords, no edema Rectal: Unremarkable  Assessment/Plan: Patient scheduled for resectoscopic polypectomy and. Patient bleeding. Preoperative evaluation consisting of sonohysterogram polyp. Biopsy. Pap smear. The following was discussed Spanish as to her upcoming surgery:                        Patient was counseled as to the risk of surgery to include the following:  1. Infection (prohylactic antibiotics will be administered)  2. DVT/Pulmonary Embolism (prophylactic pneumo compression stockings will be used)  3.Trauma to internal organs requiring additional surgical procedure to repair any injury to     Internal organs requiring perhaps additional hospitalization days.  4.Hemmorhage requiring transfusion and blood products which carry risks such as             anaphylactic reaction, hepatitis and AIDS  Patient had received literature information on the procedure scheduled and all her questions were answered and fully accepts all risk.  The following work today: CBC, fasting profile, comprehensive metabolic, TSH, urinalysis and Pap smear  [facility] HMD11:10 AMTD@Note :      Terrance Mass 04/30/2014, 10:46  AM  Note: This dictation was prepared with  Dragon/digital dictation along withSmart phrase technology. Any transcriptional errors that result from this process are unintentional.

## 2014-05-01 LAB — URINALYSIS W MICROSCOPIC + REFLEX CULTURE
Bacteria, UA: NONE SEEN
Bilirubin Urine: NEGATIVE
Casts: NONE SEEN
Crystals: NONE SEEN
Glucose, UA: NEGATIVE mg/dL
Hgb urine dipstick: NEGATIVE
Ketones, ur: NEGATIVE mg/dL
Leukocytes, UA: NEGATIVE
Nitrite: NEGATIVE
Protein, ur: NEGATIVE mg/dL
Specific Gravity, Urine: 1.023 (ref 1.005–1.030)
Urobilinogen, UA: 0.2 mg/dL (ref 0.0–1.0)
pH: 5.5 (ref 5.0–8.0)

## 2014-05-02 ENCOUNTER — Encounter (HOSPITAL_COMMUNITY): Payer: Self-pay

## 2014-05-02 ENCOUNTER — Encounter (HOSPITAL_COMMUNITY)
Admission: RE | Admit: 2014-05-02 | Discharge: 2014-05-02 | Disposition: A | Discharge status: Home / Self Care | Payer: BC Managed Care – PPO | Source: Ambulatory Visit | Attending: Gynecology | Admitting: Gynecology

## 2014-05-02 DIAGNOSIS — Z01818 Encounter for other preprocedural examination: Secondary | ICD-10-CM | POA: Diagnosis not present

## 2014-05-02 NOTE — Pre-Procedure Instructions (Signed)
During review of patient's suicide risk  history, patient became tearful.  History of suicidal ideation in past, none currently. Eda Royal Spanish Interpreter suggested  social work consult prior to d/c home after surgery 05/10/14.

## 2014-05-02 NOTE — Pre-Procedure Instructions (Signed)
Used [employer] for [employer].

## 2014-05-02 NOTE — Patient Instructions (Addendum)
  Instrucciones:  Su cirugia esta programada para-( your procedure is scheduled on) :___Thursday Oct 29____  Taylor Parker por la entrada principal a la(s) -(enter through the main entrance at):___6 AM__________  [name] e informenos de su llegada ( pick up phone, dial [phone] on arrival)  Por favor llame al [phone] si tiene algun problema la [name] ( please call [phone] if you have any problems the morning of surgery.)  Recuerde: (Remember)  No coma alimentos ni tome liquidos, incluyendo agua, despues de la medianoche del  ( Do not eat food or drink liquids including water after midnight on___Wednesday____  [employer] manana de la cirugia con un sorbito de agua (take these meds the morning of surgery with a SIP of water)____NONE_______  [employer] dientes en la manana de la [place]. (you may brush your teeth the morning of surgery)  NO use joyas, maquillaje de ojos, lapiz labial, crema para el cuerpo o esmalte de unas oscuro - las unas de los pies pueden estar pintados. ( Do not wear jewelry, eye makeup, lipstick, body lotion, or dark fingernail polish)  Puede usar desodorante ( you may wear deodorant)  Si va a ser ingresado despues de las [place], deje la [place] en el carro hasta que se le haya asignado una habitacion. ( If you are to be admitted after surgery, leave suitcase in car until your room has been assigned.)  A los pacientes que se les de de alta el mismo dia no se les permitira manejar a casa.  ( Patients discharged on the day of surgery will not be allowed to drive home)  Use ropa suelta y comoda de regreso a Manufacturing engineer. ( wear loose comfortable clothes for ride home)  [place] (patient signature) ______________________________________

## 2014-05-03 LAB — CYTOLOGY - PAP

## 2014-05-09 MED ORDER — DEXTROSE 5 % IV SOLN
2.0000 g | INTRAVENOUS | Status: AC
  Administered 2014-05-10: 2 g via INTRAVENOUS
  Filled 2014-05-09: qty 2

## 2014-05-10 ENCOUNTER — Encounter (HOSPITAL_COMMUNITY): Payer: BC Managed Care – PPO | Admitting: Anesthesiology

## 2014-05-10 ENCOUNTER — Ambulatory Visit (HOSPITAL_COMMUNITY)
Admission: RE | Admit: 2014-05-10 | Discharge: 2014-05-10 | Disposition: A | Discharge status: Home / Self Care | Payer: BC Managed Care – PPO | Source: Ambulatory Visit | Attending: Gynecology | Admitting: Gynecology

## 2014-05-10 ENCOUNTER — Ambulatory Visit (HOSPITAL_COMMUNITY): Payer: BC Managed Care – PPO | Admitting: Anesthesiology

## 2014-05-10 ENCOUNTER — Encounter (HOSPITAL_COMMUNITY): Payer: Self-pay | Admitting: Anesthesiology

## 2014-05-10 ENCOUNTER — Encounter (HOSPITAL_COMMUNITY)
Admission: RE | Disposition: A | Discharge status: Home / Self Care | Payer: Self-pay | Source: Ambulatory Visit | Attending: Gynecology

## 2014-05-10 DIAGNOSIS — Z9889 Other specified postprocedural states: Secondary | ICD-10-CM

## 2014-05-10 DIAGNOSIS — F329 Major depressive disorder, single episode, unspecified: Secondary | ICD-10-CM | POA: Diagnosis not present

## 2014-05-10 DIAGNOSIS — I739 Peripheral vascular disease, unspecified: Secondary | ICD-10-CM | POA: Insufficient documentation

## 2014-05-10 DIAGNOSIS — N840 Polyp of corpus uteri: Secondary | ICD-10-CM | POA: Insufficient documentation

## 2014-05-10 DIAGNOSIS — E785 Hyperlipidemia, unspecified: Secondary | ICD-10-CM | POA: Diagnosis not present

## 2014-05-10 DIAGNOSIS — N938 Other specified abnormal uterine and vaginal bleeding: Secondary | ICD-10-CM

## 2014-05-10 HISTORY — PX: DILATATION & CURETTAGE/HYSTEROSCOPY WITH MYOSURE: SHX6511

## 2014-05-10 LAB — PREGNANCY, URINE: Preg Test, Ur: NEGATIVE

## 2014-05-10 SURGERY — DILATATION & CURETTAGE/HYSTEROSCOPY WITH MYOSURE
Anesthesia: General | Site: Vagina

## 2014-05-10 MED ORDER — FENTANYL CITRATE 0.05 MG/ML IJ SOLN
25.0000 ug | INTRAMUSCULAR | Status: DC | PRN
  Administered 2014-05-10: 50 ug via INTRAVENOUS
  Administered 2014-05-10: 25 ug via INTRAVENOUS

## 2014-05-10 MED ORDER — PROPOFOL 10 MG/ML IV EMUL
INTRAVENOUS | Status: AC
  Filled 2014-05-10: qty 20

## 2014-05-10 MED ORDER — MIDAZOLAM HCL 5 MG/5ML IJ SOLN
INTRAMUSCULAR | Status: DC | PRN
  Administered 2014-05-10: 2 mg via INTRAVENOUS

## 2014-05-10 MED ORDER — ONDANSETRON HCL 4 MG/2ML IJ SOLN
INTRAMUSCULAR | Status: DC | PRN
  Administered 2014-05-10: 4 mg via INTRAVENOUS

## 2014-05-10 MED ORDER — SODIUM CHLORIDE 0.9 % IR SOLN
Status: DC | PRN
  Administered 2014-05-10: 3000 mL

## 2014-05-10 MED ORDER — SCOPOLAMINE 1 MG/3DAYS TD PT72
MEDICATED_PATCH | TRANSDERMAL | Status: AC
  Filled 2014-05-10: qty 1

## 2014-05-10 MED ORDER — LIDOCAINE HCL (CARDIAC) 20 MG/ML IV SOLN
INTRAVENOUS | Status: DC | PRN
  Administered 2014-05-10: 40 mg via INTRAVENOUS

## 2014-05-10 MED ORDER — MEPERIDINE HCL 25 MG/ML IJ SOLN: 6.2500 mg | INTRAMUSCULAR | Status: DC | PRN

## 2014-05-10 MED ORDER — FENTANYL CITRATE 0.05 MG/ML IJ SOLN
INTRAMUSCULAR | Status: DC | PRN
  Administered 2014-05-10 (×2): 50 ug via INTRAVENOUS

## 2014-05-10 MED ORDER — ONDANSETRON HCL 4 MG/2ML IJ SOLN
INTRAMUSCULAR | Status: AC
  Filled 2014-05-10: qty 2

## 2014-05-10 MED ORDER — SCOPOLAMINE 1 MG/3DAYS TD PT72
1.0000 | MEDICATED_PATCH | Freq: Once | TRANSDERMAL | Status: DC
  Administered 2014-05-10: 1.5 mg via TRANSDERMAL

## 2014-05-10 MED ORDER — DEXAMETHASONE SODIUM PHOSPHATE 4 MG/ML IJ SOLN
INTRAMUSCULAR | Status: AC
  Filled 2014-05-10: qty 1

## 2014-05-10 MED ORDER — FENTANYL CITRATE 0.05 MG/ML IJ SOLN
INTRAMUSCULAR | Status: AC
  Filled 2014-05-10: qty 2

## 2014-05-10 MED ORDER — VASOPRESSIN 20 UNIT/ML IV SOLN
INTRAVENOUS | Status: DC | PRN
  Administered 2014-05-10: 20 [IU]

## 2014-05-10 MED ORDER — KETOROLAC TROMETHAMINE 30 MG/ML IJ SOLN
INTRAMUSCULAR | Status: DC | PRN
  Administered 2014-05-10: 30 mg via INTRAVENOUS

## 2014-05-10 MED ORDER — KETOROLAC TROMETHAMINE 30 MG/ML IJ SOLN
INTRAMUSCULAR | Status: AC
  Filled 2014-05-10: qty 1

## 2014-05-10 MED ORDER — SODIUM CHLORIDE 0.9 % IJ SOLN
INTRAMUSCULAR | Status: AC
  Filled 2014-05-10: qty 30

## 2014-05-10 MED ORDER — METOCLOPRAMIDE HCL 10 MG PO TABS: 10.0000 mg | ORAL_TABLET | Freq: Three times a day (TID) | ORAL | Status: DC

## 2014-05-10 MED ORDER — LIDOCAINE HCL (CARDIAC) 20 MG/ML IV SOLN
INTRAVENOUS | Status: AC
  Filled 2014-05-10: qty 5

## 2014-05-10 MED ORDER — DEXAMETHASONE SODIUM PHOSPHATE 10 MG/ML IJ SOLN
INTRAMUSCULAR | Status: AC
  Filled 2014-05-10: qty 1

## 2014-05-10 MED ORDER — SODIUM CHLORIDE 0.9 % IJ SOLN
INTRAMUSCULAR | Status: DC | PRN
  Administered 2014-05-10: 30 mL

## 2014-05-10 MED ORDER — LACTATED RINGERS IV SOLN
INTRAVENOUS | Status: DC
  Administered 2014-05-10 (×2): via INTRAVENOUS

## 2014-05-10 MED ORDER — PROPOFOL 10 MG/ML IV BOLUS
INTRAVENOUS | Status: DC | PRN
  Administered 2014-05-10: 150 mg via INTRAVENOUS

## 2014-05-10 MED ORDER — DEXAMETHASONE SODIUM PHOSPHATE 4 MG/ML IJ SOLN
INTRAMUSCULAR | Status: DC | PRN
  Administered 2014-05-10: 4 mg via INTRAVENOUS

## 2014-05-10 MED ORDER — METOCLOPRAMIDE HCL 5 MG/ML IJ SOLN: 10.0000 mg | Freq: Once | INTRAMUSCULAR | Status: DC | PRN

## 2014-05-10 MED ORDER — MIDAZOLAM HCL 2 MG/2ML IJ SOLN
INTRAMUSCULAR | Status: AC
  Filled 2014-05-10: qty 2

## 2014-05-10 SURGICAL SUPPLY — 28 items
CANISTERS HI-FLOW 3000CC (CANNISTER) ×2 IMPLANT
CATH FOLEY 2WAY SLVR 30CC 16FR (CATHETERS) IMPLANT
CATH ROBINSON RED A/P 16FR (CATHETERS) ×2 IMPLANT
CLOTH BEACON ORANGE TIMEOUT ST (SAFETY) ×2 IMPLANT
CONTAINER PREFILL 10% NBF 60ML (FORM) ×4 IMPLANT
CORD ACTIVE DISPOSABLE (ELECTRODE)
CORD ELECTRO ACTIVE DISP (ELECTRODE) IMPLANT
DEVICE MYOSURE CLASSIC (MISCELLANEOUS) IMPLANT
DEVICE MYOSURE LITE (MISCELLANEOUS) ×1 IMPLANT
ELECT VAPORTRODE GRVD BAR (ELECTRODE) IMPLANT
FILTER ARTHROSCOPY CONVERTOR (FILTER) ×2 IMPLANT
GLOVE BIOGEL PI IND STRL 8 (GLOVE) ×1 IMPLANT
GLOVE BIOGEL PI INDICATOR 8 (GLOVE) ×1
GLOVE ECLIPSE 7.5 STRL STRAW (GLOVE) ×4 IMPLANT
GOWN STRL REUS W/TWL LRG LVL3 (GOWN DISPOSABLE) ×4 IMPLANT
PACK VAGINAL MINOR WOMEN LF (CUSTOM PROCEDURE TRAY) ×2 IMPLANT
PAD OB MATERNITY 4.3X12.25 (PERSONAL CARE ITEMS) ×2 IMPLANT
PAD PREP 24X48 CUFFED NSTRL (MISCELLANEOUS) ×2 IMPLANT
PLUG CATH AND CAP STER (CATHETERS) IMPLANT
SEAL ROD LENS SCOPE MYOSURE (ABLATOR) ×2 IMPLANT
SET BERKELEY SUCTION TUBING (SUCTIONS) IMPLANT
SET TUBING HYSTEROSCOPY 2 NDL (TUBING) ×2 IMPLANT
SYR 30ML LL (SYRINGE) IMPLANT
TOWEL OR 17X24 6PK STRL BLUE (TOWEL DISPOSABLE) ×4 IMPLANT
TUBE HYSTEROSCOPY W Y-CONNECT (TUBING) IMPLANT
VACURETTE 8 RIGID CVD (CANNULA) IMPLANT
VACURETTE 9 RIGID CVD (CANNULA) IMPLANT
WATER STERILE IRR 1000ML POUR (IV SOLUTION) ×2 IMPLANT

## 2014-05-10 NOTE — Progress Notes (Signed)
Patient speaks [place].  Did not need interpreter this am.  Pre-op patient with Taylor Parker, Spanish Interpreter, however patient can speak [place].  No changes since PAT appt.

## 2014-05-10 NOTE — Anesthesia Postprocedure Evaluation (Signed)
  Anesthesia Post-op Note  Patient: Taylor Parker  Procedure(s) Performed: Procedure(s) with comments: DILATATION & CURETTAGE/HYSTEROSCOPY WITH MYOSURE ABLATION,RESECTOSCOPIC POLYPECTOMY (N/A) - Confirmed with [employer] rep to be present.  Patient Location: PACU  Anesthesia Type:General  Level of Consciousness: awake, alert  and oriented  Airway and Oxygen Therapy: Patient Spontanous Breathing  Post-op Pain: none  Post-op Assessment: Post-op Vital signs reviewed, Patient's Cardiovascular Status Stable, Respiratory Function Stable, Patent Airway, No signs of Nausea or vomiting and Pain level controlled  Post-op Vital Signs: Reviewed and stable  Last Vitals:  Filed Vitals:   05/10/14 0830  BP: 132/61  Pulse: 56  Temp:   Resp: 15    Complications: No apparent anesthesia complications

## 2014-05-10 NOTE — Op Note (Signed)
   Operative Note  05/10/2014  8:22 AM  PATIENT:  Taylor Parker  48 y.o. female  PRE-OPERATIVE DIAGNOSIS:  endometrial polyp, dysfunctional uterine bleeding  POST-OPERATIVE DIAGNOSIS:  endometrial polyp, dysfunctional uterine bleeding  PROCEDURE:  Procedure(s): DILATATION & CURETTAGE/HYSTEROSCOPY WITH MYOSURE ABLATION,RESECTOSCOPIC POLYPECTOMY  SURGEON:  Surgeon(s): Terrance Mass, MD  ANESTHESIA:   general  FINDINGS: Several endometrial polyps were noted scattered throughout the uterine cavity  DESCRIPTION OF OPERATION:DESCRIPTION OF OPERATION: The patient was taken to the operating room where she underwent successful general endotracheal anesthesia. The patient was identified during the time out as well as procedure to be performed. The patient received Cefotan 2 g IV for infection prophylaxis and PAS stockings for DVT prophylaxis. The vagina and perineum were prepped and draped in usual sterile fashion and the legs were placed in the high lithotomy position. A red rubber Quentin Cornwall was inserted to evacuate the bladder was contents for approximately 50 . Exam under anesthesia demonstrated a uterus anteverted week size with no palpable adnexal masses. A weighted speculum was placed on the posterior vaginal vault. A single-tooth tenaculum was placed on the anterior cervical lip. Pitressin was injected into the cervical stroma at the 2, 4, 8, and 10:00 position for a total 10 cc (dilution: 10 units of Pitressin per 20 units of normal saline) The uterus sounded to 7 1/2 . Pratt dilators to size 19  mm was utilized to dilate the cervical canal. The 6.0  mm Hologic  operative resectoscope was utilized.  The  Myosure Lite resectoscopic blader was introduced through the resectoscope.Normal saline was the distending media. It was noted at this time the patient had  several endometrial polyps   throughout the uterine cavity.. With the tMyosure Lite blade  the endometrial polyps were  resected and passed off the operative field for histological evaluation. Adequate hemostasis was present. Patient tolerated procedure well was extubated and transferred to recovery room stable vital signs. She received Toradol 30 mg IM in route to the recover room. EBL was minimal. Fluid deficit was100 cc's. ESTIMATED BLOOD LOSS: minimal   Intake/Output Summary (Last 24 hours) at 05/10/14 1505 Last data filed at 05/10/14 6979  Gross per 24 hour  Intake   1100 ml  Output    105 ml  Net    995 ml     BLOOD ADMINISTERED:none   LOCAL MEDICATIONS USED:  OTHER Paracervical Pitession 10 units/30 cc's saline total 10 c's  SPECIMEN:  Source of Specimen:  Uterine polyps and currettings  DISPOSITION OF SPECIMEN:  PATHOLOGY  COUNTS:  YES  PLAN OF CARE: Transfer to [place] YIA1:65 AMTD@

## 2014-05-10 NOTE — Interval H&P Note (Signed)
History and Physical Interval Note:  05/10/2014 7:17 AM  Taylor Parker  has presented today for surgery, with the diagnosis of endometrial polyp, dysfunctional uterine bleeding  The various methods of treatment have been discussed with the patient and family. After consideration of risks, benefits and other options for treatment, the patient has consented to  Procedure(s) with comments: DILATATION & CURETTAGE/HYSTEROSCOPY WITH MYOSURE ABLATION,RESECTOSCOPIC POLYPECTOMY (N/A) - Confirmed with Myosure rep to be present. as a surgical intervention .  The patient's history has been reviewed, patient examined, no change in status, stable for surgery.  I have reviewed the patient's chart and labs.  Questions were answered to the patient's satisfaction.     Terrance Mass

## 2014-05-10 NOTE — Transfer of Care (Signed)
Immediate Anesthesia Transfer of Care Note  Patient: Taylor Parker  Procedure(s) Performed: Procedure(s) with comments: DILATATION & CURETTAGE/HYSTEROSCOPY WITH MYOSURE ABLATION,RESECTOSCOPIC POLYPECTOMY (N/A) - Confirmed with Myosure rep to be present.  Patient Location: PACU  Anesthesia Type:General  Level of Consciousness: sedated  Airway & Oxygen Therapy: Patient Spontanous Breathing and Patient connected to nasal cannula oxygen  Post-op Assessment: Report given to PACU RN and Post -op Vital signs reviewed and stable  Post vital signs: stable  Complications: No apparent anesthesia complications

## 2014-05-10 NOTE — H&P (View-Only) (Signed)
Taylor Parker is an 48 y.o. female presented to office today for preop consult. She is scheduled for resectoscopic polypectomy 29th of this month due to intermenstrual bleeding as a result of endometrial polyp.  Ultrasound and sonohysterogram on 03/26/2014: Ultrasound today and sonohysterogram as follows:  Uterus measuring 9.2 x 7.0 x 4.2 cm. A small fibroid 13 x 40 mm was noted. There appears to be an arcuate uterus. Right ovary normal. Left ovary is corpus luteum cyst measuring 25 x 18 mm with color flow Doppler the periphery. There was no fluid in the cul-de-sac. The cervix was cleansed with Betadine solution and a sterile catheter was introduced into the uterine cavity and normal saline was instilled. It was noted a left posterior polyp measured 15 x 7 x 6 mm was noted. An endometrial biopsy was obtained. The catheter was reinserted and the polyp was still present.  Endometrial biopsy 03/26/2014: Diagnosis Endometrium, biopsy, uterus - BENIGN PROLIFERATIVE ENDOMETRIUM. - NO ATYPIA, HYPERPLASIA OR MALIGNANCY.  Patient is status post left breast subareolar duct excision by Dr. Donne Hazel and pathology report had demonstrated:  Breast, excision, Left breast duct system  - BENIGN INTRADUCTAL PAPILLOMA   Pertinent Gynecological History: Menses: intermenstrual bleeding Bleeding: intermenstrual bleeding Contraception: tubal ligation DES exposure: denies Blood transfusions: none Sexually transmitted diseases: no past history Previous GYN Procedures: BTSP, 3 csections  Last mammogram: 2015 Date: normal  Last pap: normal Date: 2012 OB History: G3, P3   Menstrual History: Menarche age: 23  Patient's last menstrual period was 04/05/2014. Period Duration (Days): 4-5 Period Pattern: Regular Menstrual Flow: Moderate;Heavy Menstrual Control: Maxi pad Dysmenorrhea: None  Past Medical History  Diagnosis Date  . Depression   . PMDD (premenstrual dysphoric disorder)   .  Hyperlipidemia   . PONV (postoperative nausea and vomiting)     in the past  . Headache(784.0)     Past Surgical History  Procedure Laterality Date  . Cesarean section      X 3  . Tubal ligation  2004  . Incise and drain abcess      right breast  . Breast surgery  8/12    right breast biopsy  . Breast biopsy  06/19/2011    Procedure: BREAST BIOPSY;  Surgeon: Rolm Bookbinder, MD;  Location: [facility];  Service: General;  Laterality: Right;  Right breast chronic abscess drainage and debridement  . Breast ductal system excision Left 01/24/2014    Procedure:  LEFT BREAST DUCT EXCISION;  Surgeon: Rolm Bookbinder, MD;  Location: [facility] [facility];  Service: General;  Laterality: Left;    Family History  Problem Relation Age of Onset  . Heart disease Mother     Social History:  reports that she has never smoked. She has never used smokeless tobacco. She reports that she does not drink alcohol or use illicit drugs.  Allergies: No Known Allergies   (Not in a hospital admission)  REVIEW OF SYSTEMS: A ROS was performed and pertinent positives and negatives are included in the history.  GENERAL: No fevers or chills. HEENT: No change in vision, no earache, sore throat or sinus congestion. NECK: No pain or stiffness. CARDIOVASCULAR: No chest pain or pressure. No palpitations. PULMONARY: No shortness of breath, cough or wheeze. GASTROINTESTINAL: No abdominal pain, nausea, vomiting or diarrhea, melena or bright red blood per rectum. GENITOURINARY: No urinary frequency, urgency, hesitancy or dysuria. MUSCULOSKELETAL: No joint or muscle pain, no back pain, no recent trauma. DERMATOLOGIC: No rash, no itching, no lesions. ENDOCRINE: No  polyuria, polydipsia, no heat or cold intolerance. No recent change in weight. HEMATOLOGICAL: No anemia or easy bruising or bleeding. NEUROLOGIC: No headache, seizures, numbness, tingling or weakness. PSYCHIATRIC: No depression, no loss of interest in normal  activity or change in sleep pattern.     Blood pressure 106/72, height 5' 2.5" (1.588 m), weight 157 lb (71.215 kg), last menstrual period 04/05/2014.  Physical Exam:  HEENT:unremarkable Neck:Supple, midline, no thyroid megaly, no carotid bruits Lungs:  Clear to auscultation no rhonchi's or wheezes Heart:Regular rate and rhythm, no murmurs or gallops Breast Exam: Both breasts were examined sitting supine position were symmetrical in appearance no skin discoloration or nipple inversion no palpable masses or tenderness Abdomen: Soft nontender no rebound guarding Pelvic:BUS within normal limits Vagina: No lesions or discharge Cervix: No lesions or discharge Uterus: Anteverted normal size shape and consistency Adnexa: No palpable masses or tenderness Extremities: No cords, no edema Rectal: Unremarkable  Assessment/Plan: Patient scheduled for resectoscopic polypectomy and. Patient bleeding. Preoperative evaluation consisting of sonohysterogram polyp. Biopsy. Pap smear. The following was discussed Spanish as to her upcoming surgery:                        Patient was counseled as to the risk of surgery to include the following:  1. Infection (prohylactic antibiotics will be administered)  2. DVT/Pulmonary Embolism (prophylactic pneumo compression stockings will be used)  3.Trauma to internal organs requiring additional surgical procedure to repair any injury to     Internal organs requiring perhaps additional hospitalization days.  4.Hemmorhage requiring transfusion and blood products which carry risks such as             anaphylactic reaction, hepatitis and AIDS  Patient had received literature information on the procedure scheduled and all her questions were answered and fully accepts all risk.  The following work today: CBC, fasting profile, comprehensive metabolic, TSH, urinalysis and Pap smear  [facility] HMD11:10 AMTD@Note :      Terrance Mass 04/30/2014, 10:46  AM  Note: This dictation was prepared with  Dragon/digital dictation along withSmart phrase technology. Any transcriptional errors that result from this process are unintentional.

## 2014-05-10 NOTE — Anesthesia Preprocedure Evaluation (Addendum)
Anesthesia Evaluation  Patient identified by MRN, date of birth, ID band Patient awake    Reviewed: Allergy & Precautions, H&P , NPO status , Patient's Chart, lab work & pertinent test results, reviewed documented beta blocker date and time   History of Anesthesia Complications (+) history of anesthetic complications  Airway Mallampati: II  TM Distance: >3 FB Neck ROM: Full    Dental no notable dental hx. (+) Teeth Intact   Pulmonary neg pulmonary ROS,  breath sounds clear to auscultation  Pulmonary exam normal       Cardiovascular + Peripheral Vascular Disease and DVT negative cardio ROS  Rhythm:Regular Rate:Normal     Neuro/Psych  Headaches, PSYCHIATRIC DISORDERS Depression    GI/Hepatic negative GI ROS, Neg liver ROS,   Endo/Other  negative endocrine ROS  Renal/GU negative Renal ROS  negative genitourinary   Musculoskeletal negative musculoskeletal ROS (+)   Abdominal Normal abdominal exam  (+)   Peds  Hematology negative hematology ROS (+)   Anesthesia Other Findings   Reproductive/Obstetrics Endometrial polyp  DUB                            Anesthesia Physical Anesthesia Plan  ASA: II  Anesthesia Plan: General   Post-op Pain Management:    Induction: Intravenous  Airway Management Planned: LMA  Additional Equipment:   Intra-op Plan:   Post-operative Plan: Extubation in OR  Informed Consent: I have reviewed the patients History and Physical, chart, labs and discussed the procedure including the risks, benefits and alternatives for the proposed anesthesia with the patient or authorized representative who has indicated his/her understanding and acceptance.   Dental advisory given  Plan Discussed with: CRNA, Anesthesiologist and Surgeon  Anesthesia Plan Comments:         Anesthesia Quick Evaluation

## 2014-05-11 ENCOUNTER — Encounter (HOSPITAL_COMMUNITY): Payer: Self-pay | Admitting: Gynecology

## 2014-05-14 ENCOUNTER — Encounter (HOSPITAL_COMMUNITY): Payer: Self-pay | Admitting: Gynecology

## 2014-05-28 ENCOUNTER — Ambulatory Visit (INDEPENDENT_AMBULATORY_CARE_PROVIDER_SITE_OTHER): Payer: BC Managed Care – PPO | Admitting: Gynecology

## 2014-05-28 ENCOUNTER — Encounter: Payer: Self-pay | Admitting: Gynecology

## 2014-05-28 VITALS — BP 134/86

## 2014-05-28 DIAGNOSIS — F329 Major depressive disorder, single episode, unspecified: Secondary | ICD-10-CM

## 2014-05-28 DIAGNOSIS — Z09 Encounter for follow-up examination after completed treatment for conditions other than malignant neoplasm: Secondary | ICD-10-CM

## 2014-05-28 DIAGNOSIS — F32A Depression, unspecified: Secondary | ICD-10-CM

## 2014-05-28 MED ORDER — ZOLPIDEM TARTRATE 10 MG PO TABS: 10.0000 mg | ORAL_TABLET | Freq: Every evening | ORAL | Status: DC | PRN

## 2014-05-28 MED ORDER — VENLAFAXINE HCL ER 75 MG PO CP24: 75.0000 mg | ORAL_CAPSULE | Freq: Every day | ORAL | Status: DC

## 2014-05-28 NOTE — Progress Notes (Signed)
   Patient presented to the office her two-week postop visit.on October 29 patient underwent resectoscopic polypectomy as a result of endometrial polyp and dysfunctional uterine bleeding. Pictures were shown to the patient the findings from surgery as well as the following pathology report:  Diagnosis Endometrium, curettage, and polyp - BENIGN ENDOMETRIAL POLYP AND ADJACENT BENIGN WEAKLY PROLIFERATIVE ENDOMETRIUM, NO ATYPIA, HYPERPLASIA, OR MALIGNANCY. - FRAGMENTS OF SMOOTH MUSCLE BUNDLES.  Exam: Abdomen: Soft nontender no rebound or guarding Pelvic: Bartholin urethra Skene was within normal limits Vagina: No lesions or discharge Cervix no lesions or discharge Uterus: Anteverted normal size shape and consistency Adnexa: No palpable masses or tenderness Rectal exam not done  Patient has done well from her surgery but had complained of being depressed. She also has been complaining of insomnia.She feels withdrawn at times and feels like crying. She has issues in the sense that she is not allowed to see her grandson by her signs X wife. Patient does have good family support she has a 48 year old at home and is happily married. In the past she had been placed on antidepressant and had done well. She is going to be started on Effexor-XR 75 mg daily with breakfast. I'm also going to prescribe Ambien 10 mg to take 1 by mouth at bedtime when necessary. I'm also going to refer her to a therapist who speaks Spanish to help her with her situation as well. Patient has good support system denies any suicidal ideation although at times it has cross her mind. She was given counseling and support today. Instructions were provided in Spanish.

## 2014-05-28 NOTE — Patient Instructions (Signed)
Venlafaxine extended-release capsules Qu es este medicamento? La VENLAFAXINA se [place] para el tratamiento de la depresin, ansiedad y el trastorno de pnico. [place] medicamento puede ser utilizado para otros usos; si tiene alguna pregunta consulte con su proveedor de atencin mdica o con su farmacutico. MARCAS COMERCIALES DISPONIBLES: Effexor XR Qu le debo informar a mi profesional de la salud antes de tomar este medicamento? Necesita saber si usted presenta alguno de los siguientes problemas o situaciones: -trastornos sanguneos -glaucoma -enfermedad cardiaca -alta presin sangunea -alto nivel de colesterol -enfermedad renal -enfermedad heptica -niveles bajos de sodio en la sangre -mana o trastorno bipolar -convulsiones -ideas suicidas, planes o intento; si usted o alguien de su familia ha intentado un suicidio previo -toma medicamentos que tratan o previene cogulos sanguneos -enfermedad tiroidea -una reaccin alrgica o inusual a la venlafaxina, desvenlafaxina, a otros medicamentos, alimentos, colorantes o conservadores -si est embarazada o buscando quedar embarazada -si est amamantando a un beb Cmo debo utilizar este medicamento? [place] este medicamento por va oral con un vaso lleno de agua. Siga las instrucciones de la etiqueta del [place]. No corte, triture ni mstique este medicamento. T[place] este medicamento con alimentos. Si es necesario, se puede abrir con cuidado la cpsula y Energy manager todo el contenido sobre en una cuchara de pur de [place] fra. [place] la mezcla de pur de Paramedic bolita enseguida sin Network engineer y seguir con un vaso de agua para asegurar la ingestin total de las bolitas. Trate de tomar su medicamento aproximadamente a la [employer]. No tome su medicamento con una frecuencia mayor a la indicada. No deje de tomar Coca[employer]a repentinamente a menos que as indique su mdico. El detener este medicamento demasiado rpido puede  causar efectos secundarios graves o puede empeorar su condicin. Su farmacutico le dar una gua del medicamento especial con cada receta y relleno. Asegrese de leer esta informacin cada vez cuidadosamente. Hable con su pediatra para informarse acerca del uso de este medicamento en nios. Puede requerir atencin especial. Sobredosis: Pngase en contacto inmediatamente con un centro toxicolgico o una sala de urgencia si usted cree que haya tomado demasiado medicamento. ATENCIN: [employer] es solo para usted. No comparta este medicamento con nadie. Qu sucede si me olvido de una dosis? Si olvida una dosis, tmela lo antes posible. Si es casi la hora de la prxima dosis, tome slo esa dosis. No tome dosis adicionales o dobles. Qu puede interactuar con este medicamento? No tome esta medicina con ninguno de los siguientes medicamentos: -ciertos medicamentos para infecciones micticas como fluconazol, itraconazol, quetoconazol, posaconazol, voriconazol -cisapride -desvenlafaxina -dofetilida -dronedarona -duloxetina -levomilnacipran -linezolid -IMAOs, tales como Carbex, Eldepryl, Marplan, Nardil y [place]arn[place] -azul de metileno (va intravenosa) -milnacipran -pimozida -tioridazina -ziprasidona Esta medicina tambin puede interactuar con los siguientes medicamentos: -aspirina o medicamentos tipo aspirina -ciertos medicamentos para la depresin, ansiedad o trastornos psicticos -ciertos medicamentos para las migraas, tales como almotriptn, eletriptn, frovatriptn, naratriptn, rizatriptn, sumatriptn, zolmitriptn -ciertos medicamentos para dormir -ciertos medicamentos que tratan o previenen cogulos sanguneos, como warfarina, enoxaparina, dalteparina -cimetidina -clozapina -diurticos -fentanilo -furazolidona -indinavir -isoniazida -litio -metoprolol -los AINE, medicamentos para el dolor o inflamacin, como ibuprofeno o naproxeno -otros medicamentos que prolongan el  intervalo QT (causa un ritmo cardiaco anormal) -procarbazina -rasagilina -suplementos como hierba de [place], kava kava, valeriana -tramadol -triptfano Puede ser que esta lista no menciona todas las posibles interacciones. Informe a su profesional de [employer]	[place] de [employer] productos a base de hierbas, medicamentos de venta libre o suplementos nutritivos que  est tomando. Si usted fuma, consume bebidas alcohlicas o si utiliza drogas ilegales, indqueselo tambin a su profesional de [employer]	[place]. Algunas sustancias pueden interactuar con su medicamento. A qu debo estar atento al usar Coca-Cola? Informe a su mdico si sus sntomas no mejoran o si empeoran. Visite a su mdico o a su profesional de la salud para chequear su evolucin peridicamente. Debido que puede ser necesario tomar este medicamento durante varias semanas para que sea posible observar sus efectos en forma [place], es importante que sigue su tratamiento como recetado por su mdico. Los pacientes y sus familias deben estar atentos si empeora la depresin o ideas suicidas. Tambin est atento a cambios repentinos o severos de emocin, tales como el sentirse ansioso, agitado, lleno de pnico, irritable, hostil, agresivo, impulsivo, inquietud severa, demasiado excitado y hiperactivo o dificultad para conciliar el sueo. Si esto ocurre, especialmente al comenzar con el tratamiento o al cambiar de dosis, comunquese con su profesional de [employer]	[place]. Este medicamento puede aumentar la presin sangunea. Consulte con su mdico para instrucciones sobre cmo controlar su presin sangunea mientras toma este medicamento. Puede experimentar mareos o somnolencia. No conduzca ni utilice maquinaria ni haga nada que Associate Professor en estado de alerta hasta que sepa cmo le afecta este medicamento. No se siente ni se ponga de pie con rapidez, especialmente si es un paciente de edad avanzada. Esto reduce el riesgo de mareos o [employer]. El alcohol  puede interferir con el efecto de este medicamento. Evite consumir bebidas alcohlicas. Se le podr secar la boca. Masticar chicle sin azcar, chupar caramelos duros y beber agua en abundancia le ayudar a mantener la boca hmeda. Qu efectos secundarios puedo tener al [employer] este medicamento? Efectos secundarios que debe informar a su mdico o a Barrister's clerk de la salud tan pronto como sea posible: -Chief of Staff como erupcin cutnea, picazn o urticarias, hinchazn de la cara, labios o lengua -problemas respiratorios -cambios en la visin -alucinaciones, prdida del contacto con la realidad -convulsiones -ideas suicidas u otros cambios de humor -dificultad para orinar o cambios en el volumen de orina -sangrado, magulladuras inusuales Efectos secundarios que, por lo general, no requieren Geophysical data processor (debe informarlos a su mdico o a su profesional de la salud si persisten o si son molestos): -cambios en el deseo sexual o capacidad -estreimiento -aumento de la sudoracin -prdida del apetito -nuseas -temblores -prdida de peso Puede ser que esta lista no menciona todos los posibles efectos secundarios. Comunquese a su mdico por asesoramiento mdico [employer]. Usted puede informar los efectos secundarios a la FDA por telfono al 1-800-FDA-1088. Dnde debo guardar mi medicina? Mantngala fuera del alcance de los nios. [place] a [employer], entre 20 y 19 grados C (76 y 74 grados F), en un Academic librarian. Deseche todo el medicamento que no haya utilizado, despus de la fecha de vencimiento. ATENCIN: Este folleto es un resumen. Puede ser que no cubra toda la posible informacin. Si usted tiene preguntas acerca de esta medicina, consulte con su mdico, su farmacutico o su profesional de Technical sales engineer.  2015, Elsevier/Gold Standard. (2013-02-16 17:57:26) Depresin (Depression) La depresin es un sentimiento de tristeza, decaimiento,  sufrimiento espiritual, melancola, pesimismo o vaco. En general, hay dos tipos de depresin: 1. Tristeza o afliccin normal. Todos tenemos este tipo de depresin de vez en cuando, despus de atravesar alguna experiencia triste, como la prdida de un trabajo o el final de Theatre manager. Este tipo de depresin se considera normal, es  de corta duracin y se resuelve en cuestin de unos pocos das a 2 semanas. La depresin correspondiente a la prdida de un ser querido (duelo) a menudo dura ms que 2semanas, pero normalmente mejora con el [place]. 2. Depresin clnica. Este tipo de depresin dura ms que la tristeza o afliccin normal, o interfiere en su capacidad de funcionar en el hogar, en el trabajo y en la escuela. Tambin interfiere en las relaciones personales. Afecta casi todos los aspectos de la vida. La depresin clnica es una enfermedad. Los sntomas de la depresin tambin pueden tener causas diferentes a las mencionadas arriba, por ejemplo:  Enfermedades fsicas. Algunas enfermedades fsicas, como hipotiroidismo, anemia grave, tipos especficos de cncer, diabetes, convulsiones incontroladas, problemas cardacos y pulmonares, ictus y Social research officer, government crnico, se asocian con frecuencia a los sntomas de la depresin.  Efectos secundarios de algunos medicamentos recetados. En algunas personas, determinados tipos de medicamentos pueden causar sntomas de depresin.  Consumo de drogas. El consumo de alcohol y drogas puede causar sntomas de depresin. [place] y duelo normal son:  Jodelle Gross o llorar durante perodos cortos de [place].  Falta de preocupacin por todo (apata).  Dificultad para dormir o dormir demasiado.  No poder disfrutar de las cosas que antes disfrutaba.  El deseo de estar solo todo el tiempo (aislamiento social).  Falta de energa o motivacin.  Dificultad para concentrarse o recordar.  Cambios en el apetito o en el peso.  Inquietud o  agitacin. Los sntomas de la depresin [place] son los mismos de la tristeza o duelo normal y tambin [employer] siguientes sntomas:  Sentirse triste o llorar todo el [place].  Sentimientos de culpa o inutilidad.  Sentimientos de desesperanza o desamparo.  Pensamientos de suicidio o el deseo de daarse a s mismo (ideas suicidas).  Prdida de contacto con la realidad (sntomas psicticos). Ver o escuchar cosas que no son reales (alucinaciones) o tener creencias falsas acerca de su vida o de las personas que lo rodean (delirios y paranoia). DIAGNSTICO El diagnstico de depresin clnica por lo general est basado en la gravedad y la duracin de los sntomas. El mdico le har preguntas sobre su historia clnica y [place] de sustancias, para determinar si la causa de la depresin es una enfermedad fsica, el uso de medicamentos recetados o el abuso de sustancias. El mdico tambin podr indicarle anlisis de [place]. TRATAMIENTO Por lo general, la tristeza y la afliccin normal no requieren tratamiento. Pero a veces se indican antidepresivos durante el duelo para [employer] sntomas de depresin hasta que se resuelvan. El tratamiento de la depresin clnica depende de la gravedad de los sntomas, pero suele incluir antidepresivos, psicoterapia con un profesional de la salud mental o una combinacin de [place]. El mdico lo ayudar a Leisure centre manager tratamiento para usted. La depresin causada por una enfermedad fsica generalmente desaparece con tratamiento mdico adecuado de la enfermedad. Si un medicamento recetado le causa depresin, consulte al mdico si debe suspenderlo, disminuir la dosis o sustituirlo por otro medicamento. La depresin causada por el abuso de alcohol o de drogas desaparece al dejar de usar estas sustancias. Algunos adultos necesitan ayuda profesional para dejar de beber o de consumir drogas. SOLICITE ATENCIN MDICA DE INMEDIATO SI:  Tiene pensamientos acerca de  lastimarse o daar a Producer, television/film/video.  Pierde el contacto con la realidad (tiene sntomas psicticos).  Est tomando medicamentos para la depresin y tiene efectos secundarios graves. [place] [employer] on Mental  Illness: [email or web address]: [email or web address] Document Released: 06/29/2005 Document Revised: 11/13/2013 [facility] Patient Information 2015 [place], [place]. This information is not intended to replace advice given to you by your health care provider. Make sure you discuss any questions you have with your health care provider.

## 2014-07-06 ENCOUNTER — Inpatient Hospital Stay (HOSPITAL_COMMUNITY)
Admission: AD | Admit: 2014-07-06 | Discharge: 2014-07-06 | Disposition: A | Discharge status: Home / Self Care | Payer: BC Managed Care – PPO | Source: Ambulatory Visit | Attending: Gynecology | Admitting: Gynecology

## 2014-07-06 ENCOUNTER — Encounter (HOSPITAL_COMMUNITY): Payer: Self-pay | Admitting: *Deleted

## 2014-07-06 DIAGNOSIS — N61 Inflammatory disorders of breast: Secondary | ICD-10-CM | POA: Insufficient documentation

## 2014-07-06 DIAGNOSIS — N644 Mastodynia: Secondary | ICD-10-CM | POA: Diagnosis present

## 2014-07-06 DIAGNOSIS — E785 Hyperlipidemia, unspecified: Secondary | ICD-10-CM | POA: Insufficient documentation

## 2014-07-06 DIAGNOSIS — N611 Abscess of the breast and nipple: Secondary | ICD-10-CM

## 2014-07-06 MED ORDER — HYDROCODONE-ACETAMINOPHEN 5-325 MG PO TABS: 1.0000 | ORAL_TABLET | Freq: Four times a day (QID) | ORAL | Status: DC | PRN

## 2014-07-06 MED ORDER — CEPHALEXIN 500 MG PO CAPS: 500.0000 mg | ORAL_CAPSULE | Freq: Three times a day (TID) | ORAL | Status: DC

## 2014-07-06 MED ORDER — SULFAMETHOXAZOLE-TRIMETHOPRIM 800-160 MG PO TABS: 1.0000 | ORAL_TABLET | Freq: Two times a day (BID) | ORAL | Status: DC

## 2014-07-06 NOTE — Discharge Instructions (Signed)
Call [facility] for follow up.  Absceso (Abscess)  Un absceso es una zona infectada que contiene pus y desechos.Puede aparecer en cualquier parte del cuerpo. Tambin se lo conoce como fornculo o divieso. CAUSAS  Ocurre cuando los tejidos se infectan. Tambin puede formarse por obstruccin de las glndulas sebceas o las glndulas sudorparas, infeccin de los folculos pilosos o por una lesin pequea en la piel. A medida que el organismo lucha contra la infeccin, se acumula pus en la zona y hace presin debajo de la piel. Esta presin causa dolor. Las personas con un sistema inmunolgico debilitado tienen dificultad para Pension scheme manager las infecciones y pueden formar abscesos con ms frecuencia.  SNTOMAS  Generalmente un absceso se forma sobre la piel y se vuelve una masa dolorosa, roja, caliente y sensible. Si se forma debajo de la piel, podr sentir como una zona blanda, que se Parker, debajo de la piel. Algunos abscesos se abren (ruptura) por s mismos, pero la mayora seguir empeorando si no se lo trata. La infeccin puede diseminarse hacia otros sitios del cuerpo y finalmente al torrente sanguneo y hace que el enfermo se sienta mal.  DIAGNSTICO  El mdico le har una historia clnica y un examen fsico. Podrn tomarle Truddie Coco de lquido del absceso y Web designer para Pension scheme manager la causa de la infeccin. .  TRATAMIENTO  El mdico le indicar antibiticos para combatir la infeccin. Sin embargo, el uso de antibiticos solamente no curar el absceso. El mdico tendr que hacer un pequeo corte (incisin) en el absceso para drenar el pus. En algunos casos se introduce una gasa en el absceso para reducir Conservation officer, historic buildings y que siga drenando la zona.  INSTRUCCIONES PARA EL CUIDADO EN EL HOGAR   Solo tome medicamentos de venta libre o recetados para Conservation officer, historic buildings, Tree surgeon o fiebre, segn las indicaciones del mdico.  Si le han recetado antibiticos, tmelos segn las indicaciones. Tmelos  todos, aunque se sienta mejor.  Si le aplicaron una gasa, siga las indicaciones del mdico para [place].  Para evitar la propagacin de la infeccin:  Mantenga el absceso cubierto con el vendaje.  Lvese bien las manos.  No comparta artculos de cuidado personal, toallas o jacuzzis con los dems.  Evite el contacto con la piel de Producer, television/film/video.  Mantenga la piel y la ropa limpia alrededor del absceso.  Cumpla con todas las visitas de control, segn le indique su mdico. SOLICITE ATENCIN MDICA SI:   Aumenta el dolor, la hinchazn, el enrojecimiento, drena lquido o sangra.  Siente dolores musculares, escalofros, o una sensacin general de Tree surgeon.  Tiene fiebre. ASEGRESE DE QUE:   Comprende estas instrucciones.  Controlar su enfermedad.  Solicitar ayuda de inmediato si no mejora o si empeora. Document Released: 06/29/2005 Document Revised: 12/29/2011 [facility] Patient Information 2015 [place], [place]. This information is not intended to replace advice given to you by your health care provider. Make sure you discuss any questions you have with your health care provider.

## 2014-07-06 NOTE — MAU Note (Signed)
Pt states she had "bone pain" yesterday, particularly pain in L breast.  Pt states she massaged her breast today because of the pain & had a drainage like pus.  Pt states she had similar situation with her R breast & had I&D 2 years ago.  Had I&D on L breast in July.  Also C/O HA.

## 2014-07-06 NOTE — MAU Provider Note (Signed)
CSN: [id number]     Arrival date & time 07/06/14  1306 History   None    Chief Complaint  Patient presents with  . Breast Pain     (Consider location/radiation/quality/duration/timing/severity/associated sxs/prior Treatment) Patient is a 48 y.o. female presenting with abscess. The history is provided by the patient. The history is limited by a language barrier. A language interpreter was used.  Abscess  This is a new problem. The current episode started yesterday. The abscess is present on the trunk. The abscess is characterized by redness, painfulness, draining and swelling.   Taylor Parker is a 48 y.o. female who presents to the MAU with breast pain. The pain started yesterday. The pain is located in the left breast. She rates the pain as 8/10. She has had similar problem in the past and had to have it drained by [facility] in July. The area is draining some now when she presses.   Past Medical History  Diagnosis Date  . Hyperlipidemia     diet controlled  . PONV (postoperative nausea and vomiting)     in the past  . Depression     no med  . PMDD (premenstrual dysphoric disorder)   . Headache(784.0)     otc med prn   Past Surgical History  Procedure Laterality Date  . Cesarean section      X 3  . Tubal ligation  2004  . Incise and drain abcess      right breast  . Breast surgery  8/12    right breast biopsy  . Breast biopsy  06/19/2011    Procedure: BREAST BIOPSY;  Surgeon: Rolm Bookbinder, MD;  Location: [place];  Service: General;  Laterality: Right;  Right breast chronic abscess drainage and debridement  . Breast ductal system excision Left 01/24/2014    Procedure:  LEFT BREAST DUCT EXCISION;  Surgeon: Rolm Bookbinder, MD;  Location: [place];  Service: General;  Laterality: Left;  . Dilatation & curettage/hysteroscopy with myosure N/A 05/10/2014    Procedure: DILATATION & CURETTAGE/HYSTEROSCOPY WITH MYOSURE  ABLATION,RESECTOSCOPIC POLYPECTOMY;  Surgeon: Terrance Mass, MD;  Location: [facility];  Service: Gynecology;  Laterality: N/A;  Confirmed with Myosure rep to be present.   Family History  Problem Relation Age of Onset  . Heart disease Mother    History  Substance Use Topics  . Smoking status: Never Smoker   . Smokeless tobacco: Never Used  . Alcohol Use: No     Comment: social   OB History    Gravida Para Term Preterm AB TAB SAB Ectopic Multiple Living   3 3 3       3      Review of Systems Negative except as stated in HPI   Allergies  Review of patient's allergies indicates no known allergies.  Home Medications   Prior to Admission medications   Medication Sig Start Date End Date Taking? Authorizing Provider  metoCLOPramide (REGLAN) 10 MG tablet Take 1 tablet (10 mg total) by mouth 3 (three) times daily with meals. 05/10/14   Terrance Mass, MD  venlafaxine XR (EFFEXOR-XR) 75 MG 24 hr capsule Take 1 capsule (75 mg total) by mouth daily with breakfast. 05/28/14   Terrance Mass, MD  zolpidem (AMBIEN) 10 MG tablet Take 1 tablet (10 mg total) by mouth at bedtime as needed for sleep. 05/28/14 06/27/14  Terrance Mass, MD   BP 107/45 mmHg  Pulse 79  Temp(Src) 98.4 F (36.9 C) (  Oral)  Resp 18  Ht 5\' 3"  (1.6 m)  Wt 159 lb (72.122 kg)  BMI 28.17 kg/m2  LMP 06/20/2014 Physical Exam  Constitutional: She is oriented to person, place, and time. She appears well-developed and well-nourished.  HENT:  Head: Normocephalic.  Eyes: EOM are normal.  Neck: Neck supple.  Cardiovascular: Normal rate.   Pulmonary/Chest: Effort normal.    Large tender mass palpated left breast @ 5 o'clock. Tender left axilla. Drainage from nipple.   Musculoskeletal: Normal range of motion.  Neurological: She is alert and oriented to person, place, and time. No cranial nerve deficit.  Skin: Skin is warm and dry.  Psychiatric: She has a normal mood and affect. Her behavior is normal.  Nursing  note and vitals reviewed.   ED Course  Procedures   MDM  48 y.o. female with left breast tenderness that started yesterday. Mass palpated and drainage from nipple. Most likely recurrent breast abscess. Stable for d/c without fever or signs of sepsis. Patient to follow up with her doctor at [facility]. Will start antibiotics and pain medication and she will apply warm wet compresses to the area. She will take ibuprofen in addition to the other medications.  Discussed with the patient using the translator, Microsoft, and all questioned fully answered. She will return if any problems arise.    Medication List    STOP taking these medications        metoCLOPramide 10 MG tablet  Commonly known as:  REGLAN     zolpidem 10 MG tablet  Commonly known as:  AMBIEN      TAKE these medications        cephALEXin 500 MG capsule  Commonly known as:  KEFLEX  Take 1 capsule (500 mg total) by mouth 3 (three) times daily.     HYDROcodone-acetaminophen 5-325 MG per tablet  Commonly known as:  NORCO  Take 1 tablet by mouth every 6 (six) hours as needed for moderate pain.     sulfamethoxazole-trimethoprim 800-160 MG per tablet  Commonly known as:  SEPTRA DS  Take 1 tablet by mouth every 12 (twelve) hours.      ASK your doctor about these medications        venlafaxine XR 75 MG 24 hr capsule  Commonly known as:  EFFEXOR-XR  Take 1 capsule (75 mg total) by mouth daily with breakfast.

## 2014-07-10 ENCOUNTER — Other Ambulatory Visit (INDEPENDENT_AMBULATORY_CARE_PROVIDER_SITE_OTHER): Payer: Self-pay | Admitting: Surgery

## 2014-07-11 ENCOUNTER — Other Ambulatory Visit (INDEPENDENT_AMBULATORY_CARE_PROVIDER_SITE_OTHER): Payer: Self-pay | Admitting: General Surgery

## 2014-07-11 DIAGNOSIS — Z872 Personal history of diseases of the skin and subcutaneous tissue: Secondary | ICD-10-CM

## 2014-07-19 ENCOUNTER — Ambulatory Visit
Admission: RE | Admit: 2014-07-19 | Discharge: 2014-07-19 | Disposition: A | Discharge status: Home / Self Care | Payer: BLUE CROSS/BLUE SHIELD | Source: Ambulatory Visit | Attending: General Surgery | Admitting: General Surgery

## 2014-07-19 DIAGNOSIS — Z872 Personal history of diseases of the skin and subcutaneous tissue: Secondary | ICD-10-CM

## 2015-04-23 ENCOUNTER — Other Ambulatory Visit: Payer: Self-pay

## 2015-04-23 DIAGNOSIS — Z1231 Encounter for screening mammogram for malignant neoplasm of breast: Secondary | ICD-10-CM

## 2015-05-02 ENCOUNTER — Ambulatory Visit (INDEPENDENT_AMBULATORY_CARE_PROVIDER_SITE_OTHER): Payer: BLUE CROSS/BLUE SHIELD | Admitting: Gynecology

## 2015-05-02 ENCOUNTER — Encounter: Payer: Self-pay | Admitting: Gynecology

## 2015-05-02 VITALS — BP 110/70 | Ht 64.0 in | Wt 159.0 lb

## 2015-05-02 DIAGNOSIS — Z23 Encounter for immunization: Secondary | ICD-10-CM

## 2015-05-02 DIAGNOSIS — Z01419 Encounter for gynecological examination (general) (routine) without abnormal findings: Secondary | ICD-10-CM | POA: Diagnosis not present

## 2015-05-02 LAB — COMPREHENSIVE METABOLIC PANEL
ALT: 13 U/L (ref 6–29)
AST: 13 U/L (ref 10–35)
Albumin: 3.7 g/dL (ref 3.6–5.1)
Alkaline Phosphatase: 66 U/L (ref 33–115)
BUN: 16 mg/dL (ref 7–25)
CO2: 25 mmol/L (ref 20–31)
Calcium: 8.8 mg/dL (ref 8.6–10.2)
Chloride: 106 mmol/L (ref 98–110)
Creat: 0.56 mg/dL (ref 0.50–1.10)
Glucose, Bld: 92 mg/dL (ref 65–99)
Potassium: 4 mmol/L (ref 3.5–5.3)
Sodium: 139 mmol/L (ref 135–146)
Total Bilirubin: 0.2 mg/dL (ref 0.2–1.2)
Total Protein: 6.3 g/dL (ref 6.1–8.1)

## 2015-05-02 LAB — CBC WITH DIFFERENTIAL/PLATELET
Basophils Absolute: 0.1 10*3/uL (ref 0.0–0.1)
Basophils Relative: 1 % (ref 0–1)
Eosinophils Absolute: 0.1 10*3/uL (ref 0.0–0.7)
Eosinophils Relative: 1 % (ref 0–5)
HCT: 37.9 % (ref 36.0–46.0)
Hemoglobin: 12.6 g/dL (ref 12.0–15.0)
Lymphocytes Relative: 43 % (ref 12–46)
Lymphs Abs: 2.2 10*3/uL (ref 0.7–4.0)
MCH: 29.2 pg (ref 26.0–34.0)
MCHC: 33.2 g/dL (ref 30.0–36.0)
MCV: 87.9 fL (ref 78.0–100.0)
MPV: 10.4 fL (ref 8.6–12.4)
Monocytes Absolute: 0.4 10*3/uL (ref 0.1–1.0)
Monocytes Relative: 7 % (ref 3–12)
Neutro Abs: 2.4 10*3/uL (ref 1.7–7.7)
Neutrophils Relative %: 48 % (ref 43–77)
Platelets: 241 10*3/uL (ref 150–400)
RBC: 4.31 MIL/uL (ref 3.87–5.11)
RDW: 15.2 % (ref 11.5–15.5)
WBC: 5.1 10*3/uL (ref 4.0–10.5)

## 2015-05-02 LAB — LIPID PANEL
Cholesterol: 215 mg/dL — ABNORMAL HIGH (ref 125–200)
HDL: 22 mg/dL — ABNORMAL LOW (ref >=46)
Total CHOL/HDL Ratio: 9.8 Ratio — ABNORMAL HIGH (ref <=5.0)
Triglycerides: 814 mg/dL — ABNORMAL HIGH (ref <150)

## 2015-05-02 NOTE — Patient Instructions (Signed)
Control del colesterol  Los niveles de colesterol en el organismo estn determinados significativamente por su dieta. Los niveles de colesterol tambin se relacionan con la enfermedad cardaca. El material que sigue ayuda a explicar esta relacin y a analizar qu puede hacer para mantener su corazn sano. No todo el colesterol es malo. Las lipoprotenas de baja densidad (LDL) forman el colesterol "malo". El colesterol malo puede ocasionar depsitos de grasa que se acumulan en el interior de las arterias. Las lipoprotenas de alta densidad (HDL) es el colesterol "bueno". Ayuda a remover el colesterol LDL "malo" de la sangre. El colesterol es un factor de riesgo muy importante para la enfermedad cardaca. Otros factores de riesgo son la hipertensin arterial, el hbito de fumar, el estrs, la herencia y el peso.   El msculo cardaco obtiene el suministro de sangre a travs de las arterias coronarias. Si su colesterol LDL ("malo") est elevado y el HDL ("bueno") es bajo, tiene un factor de riesgo para que se formen depsitos de grasa en las arterias coronarias (los vasos sanguneos que suministran sangre al corazn). Esto hace que haya menos lugar para que la sangre circule. Sin la suficiente sangre y oxgeno, el msculo cardaco no puede funcionar correctamente, y usted podr sentir dolores en el pecho (angina pectoris). Cuando una arteria coronaria se cierra completamente, una parte del msculo cardaco puede morir (infarto de miocardio).  CONTROL DEL COLESTEROL Cuando el profesional que lo asiste enva la sangre al laboratorio para conocer el nivel de colesterol, puede realizarle tambin un perfil completo de los lpidos. Con esta prueba, se puede determinar la cantidad total de colesterol, as como los niveles de LDL y HDL. Los triglicridos son un tipo de grasa que circula en la sangre y que tambin puede utilizarse  para determinar el riesgo de enfermedad cardaca. En la siguiente tabla se establecen los nmeros ideales: Prueba: Colesterol total  Menos de 200 mg/dl.  Prueba: LDL "colesterol malo"  Menos de 100 mg/dl.   Menos de 70 mg/dl si tiene riesgo muy elevado de sufrir un ataque cardaco o muerte cardaca sbita.  Prueba: HDL "colesterol bueno"  Mujeres: Ms de 50 mg/dl.   Hombres: Ms de 40 mg/dl.  Prueba: Trigliceridos  Menos de 150 mg/dl.    CONTROL DEL COLESTEROL CON DIETA Aunque factores como el ejercicio y el estilo de vida son importantes, la "primera lnea de ataque" es la dieta. Esto se debe a que se sabe que ciertos alimentos hacen subir el colesterol y otros lo bajan. El objetivo debe ser equilibrar los alimentos, de modo que tengan un efecto sobre el colesterol y, an ms importante, reemplazar las grasas saturadas y trans con otros tipos de grasas, como las monoinsaturadas y las poliinsaturadas y cidos grasos omega-3 . En promedio, una persona no debe consumir ms de 15 a 17 g de grasas saturadas por da. Las grasas saturadas y trans se consideran grasas "malas", ya que elevan el colesterol LDL. Las grasas saturadas se encuentran principalmente en productos animales como carne, manteca y crema. Pero esto no significa que usted debe sacrificar todas sus comidas favoritas. Actualmente, como lo muestra el cuadro que figura al final de este documento, hay sustitutos de buen sabor, bajos en grasas y en colesterol, para la mayora de los alimentos que a usted le gusta comer. Elija aquellos alimentos alternativos que sean bajos en grasas o sin grasas. Elija cortes de carne del cuarto trasero o lomo ya que estos cortes son los que tienen menor cantidad de grasa   y colesterol. El pollo (sin piel), el pescado, la carne de ternera, y la pechuga de pavo molida son excelentes opciones. Elimine las carnes grasosas como los hotdogs o el salami. Los mariscos tienen poco o nada de grasas saturadas. Cuando  consuma carne magra, carne de aves de corral, o pescado, hgalo en porciones de 85 gramos (3 onzas). Las grasas trans tambin se llaman "aceites parcialmente hidrogenados". Son aceites manipulados cientficamente de modo que son slidos a temperatura ambiente, tienen una larga vida y mejoran el sabor y la textura de los alimentos a los que se agregan. Las grasas trans se encuentran en la margarina, masitas, crackers y alimentos horneados.  Para hornear y cocinar, el aceite es un excelente sustituto para la mantequilla. Los aceites monoinsaturados tienen un beneficio particular, ya que se cree que disminuyen el colesterol LDL (colesterol malo) y elevan el HDL. Deber evitar los aceites tropicales saturados como el de coco y el de palma.  Recuerde, adems, que puede comer sin restricciones los grupos de alimentos que son naturalmente libres de grasas saturadas y grasas trans, entre los que se incluyen el pescado, las frutas (excepto el aguacate), verduras, frijoles, cereales (cebada, arroz, cuzcuz, trigo) y las pastas (sin salsas con crema)   IDENTIFIQUE LOS ALIMENTOS QUE DISMINUYEN EL COLESTEROL  Pueden disminuir el colesterol las fibras solubles que estn en las frutas, como las manzanas, en los vegetales como el brcoli, las patatas y las zanahorias; en las legumbres como frijoles, guisantes y lentejas; y en los cereales como la cebada. Los alimentos fortificados con fitosteroles tambin pueden disminuir el colesterol. Debe consumir al menos 2 g de estos alimentos a diario para obtener el efecto de disminucin de colesterol.  En el supermercado, lea las etiquetas de los envases para identificar los alimentos bajos en grasas saturadas, libres de grasas trans y bajos en grasas, . Elija quesos que tengan solo de 2 a 3 g de grasa saturada por onza (28,35 g). Use una margarina que no dae el corazn, libre de grasas trans o aceite parcialmente hidrogenado. Al comprar alimentos horneados (galletitas dulces y  galletas) evite el aceite parcialmente hidrogenado. Los panes y bollos debern ser de granos enteros (harina de maz o de avena entera, en lugar de "harina" o "harina enriquecida"). Compre sopas en lata que no sean cremosas, con bajo contenido de sal y sin grasas adicionadas.   TCNICAS DE PREPARACIN DE LOS ALIMENTOS  Nunca fra los alimentos en aceite abundante. Si debe frer, hgalo en poco aceite y removiendo constantemente, porque as se utilizan muy pocas grasas, o utilice un spray antiadherente. Cuando le sea posible, hierva, hornee o ase las carnes y cocine los vegetales al vapor. En vez de aderezar los vegetales con mantequilla o margarina, utilice limn y hierbas, pur de manzanas y canela (para las calabazas y batatas), yogurt y salsa descremados y aderezos para ensaladas bajos en contenido graso.   BAJO EN GRASAS SATURADAS / SUSTITUTOS BAJOS EN GRASA  Carnes / Grasas saturadas (g)  Evite: Bife, corte graso (3 oz/85 g) / 11 g   Elija: Bife, corte magro (3 oz/85 g) / 4 g   Evite: Hamburguesa (3 oz/85 g) / 7 g   Elija:  Hamburguesa magra (3 oz/85 g) / 5 g   Evite: Jamn (3 oz/85 g) / 6 g   Elija:  Jamn magro (3 oz/85 g) / 2.4 g   Evite: Pollo, con piel (3 oz/85 g), Carne oscura / 4 g   Elija:  Pollo, sin piel (  3 oz/85 g), Carne oscura / 2 g   Evite: Pollo, con piel (3 oz/85 g), Carne magra / 2.5 g   Elija: Pollo, sin piel (3 oz/85 g), Carne magra / 1 g  Lcteos / Grasas saturadas (g)  Evite: Leche entera (1 taza) / 5 g   Elija: Leche con bajo contenido de grasa, 2% (1 taza) / 3 g   Elija: Leche con bajo contenido de grasa, 1% (1 taza) / 1.5 g   Elija: Leche descremada (1 taza) / 0.3 g   Evite: Queso duro (1 oz/28 g) / 6 g   Elija: Queso descremado (1 oz/28 g) / 2-3 g   Evite: Queso cottage, 4% grasa (1 taza)/ 6.5 g   Elija: Queso cottage con bajo contenido de grasa, 1% grasa (1 taza)/ 1.5 g   Evite: Helado (1 taza) / 9 g   Elija: Sorbete (1 taza) / 2.5 g    Elija: Yogurt helado sin contenido de grasa (1 taza) / 0.3 g   Elija: Barras de fruta congeladas / vestigios   Evite: Crema batida (1 cucharada) / 3.5 g   Elija: Batidos glac sin lcteos (1 cucharada) / 1 g  Condimentos / Grasas saturadas (g)  Evite: Mayonesa (1 cucharada) / 2 g   Elija: Mayonesa con bajo contenido de grasa (1 cucharada) / 1 g   Evite: Manteca (1 cucharada) / 7 g   Elija: Margarina extra light (1 cucharada) / 1 g   Evite: Aceite de coco (1 cucharada) / 11.8 g   Elija: Aceite de oliva (1 cucharada) / 1.8 g   Elija: Aceite de maz (1 cucharada) / 1.7 g   Elija: Aceite de crtamo (1 cucharada) / 1.2 g   Elija: Aceite de girasol (1 cucharada) / 1.4 g   Elija: Aceite de soja (1 cucharada) / 2.4 g   Elija: Aceite de canola (1 cucharada) / 1 g  Document Released: 06/29/2005 Document Revised: 03/11/2011 [employer] Patient Information 2012 [employer].  

## 2015-05-02 NOTE — Progress Notes (Signed)
Taylor Parker 1965-11-20 [id number]   History:    50 y.o.  for annual gyn exam with no complaints today. Patient has been followed for several years by the general surgeons as a result of her chronic bilateral breast mastitis at which time she has had drainage of these breast abscesses her last operation was in July 2016 and she has a follow-up mammogram next month. Patient with past history of DVT and had been anticoagulated for 6 months postpartum after her last pregnancy. She's had a tubal ligation at that time. Patient has history of hyperlipidemia and had been started on Lipitor did not continue the medication beyond 2 months she stated she did not like to wait made her feel. She's currently on no medication for that.  On 05/10/2014 patient underwent a resectoscopic polypectomy as a result of endometrial polyp and dysfunctional uterine bleeding. Pathology report was benign. Patient now reports normal menstrual cycles.  Past medical history,surgical history, family history and social history were all reviewed and documented in the EPIC chart.  Gynecologic History Patient's last menstrual period was 04/24/2015. Contraception: tubal ligation Last Pap: 2015. Results were: normal Last mammogram: 2015 Results were: Normal  Obstetric History OB History  Gravida Para Term Preterm AB SAB TAB Ectopic Multiple Living  3 3 3       3     # Outcome Date GA Lbr Len/2nd Weight Sex Delivery Anes PTL Lv  3 Term     M CS-Unspec  N Y  2 Term     M CS-Unspec  N Y  1 Term     F CS-Unspec  N Y       ROS: A ROS was performed and pertinent positives and negatives are included in the history.  GENERAL: No fevers or chills. HEENT: No change in vision, no earache, sore throat or sinus congestion. NECK: No pain or stiffness. CARDIOVASCULAR: No chest pain or pressure. No palpitations. PULMONARY: No shortness of breath, cough or wheeze. GASTROINTESTINAL: No abdominal pain, nausea, vomiting or  diarrhea, melena or bright red blood per rectum. GENITOURINARY: No urinary frequency, urgency, hesitancy or dysuria. MUSCULOSKELETAL: No joint or muscle pain, no back pain, no recent trauma. DERMATOLOGIC: No rash, no itching, no lesions. ENDOCRINE: No polyuria, polydipsia, no heat or cold intolerance. No recent change in weight. HEMATOLOGICAL: No anemia or easy bruising or bleeding. NEUROLOGIC: No headache, seizures, numbness, tingling or weakness. PSYCHIATRIC: No depression, no loss of interest in normal activity or change in sleep pattern.     Exam: chaperone present  BP 110/70 mmHg  Ht 5\' 4"  (1.626 m)  Wt 159 lb (72.122 kg)  BMI 27.28 kg/m2  LMP 04/24/2015  Body mass index is 27.28 kg/(m^2).  General appearance : Well developed well nourished female. No acute distress HEENT: Eyes: no retinal hemorrhage or exudates,  Neck supple, trachea midline, no carotid bruits, no thyroidmegaly Lungs: Clear to auscultation, no rhonchi or wheezes, or rib retractions  Heart: Regular rate and rhythm, no murmurs or gallops Breast:Examined in sitting and supine position were symmetrical in appearance, no palpable masses or tenderness,  no skin retraction, no nipple inversion, no nipple discharge, no skin discoloration, no axillary or supraclavicular lymphadenopathy Abdomen: no palpable masses or tenderness, no rebound or guarding Extremities: no edema or skin discoloration or tenderness  Pelvic:  Bartholin, Urethra, Skene Glands: Within normal limits             Vagina: No gross lesions or discharge  Cervix: No gross  lesions or discharge  Uterus  anteverted, normal size, shape and consistency, non-tender and mobile  Adnexa  Without masses or tenderness  Anus and perineum  normal   Rectovaginal  normal sphincter tone without palpated masses or tenderness             Hemoccult not indicated     Assessment/Plan:  49 y.o. female for annual exam is being followed on a general surgeons as a result of her  history of bilateral breast abscesses/chronic mastitis. Patient doing well with no complaints today. Pap smear not done today in accordance to the new guidelines. The following screening blood work was ordered today: Fasting lipid profile, conference metabolic panel, TSH, CBC, and urinalysis. Patient to follow-up with her mammogram next month and with a general surgeon.   Terrance Mass MD, 12:30 PM 05/02/2015

## 2015-05-03 LAB — URINALYSIS W MICROSCOPIC + REFLEX CULTURE
Bacteria, UA: NONE SEEN [HPF]
Bilirubin Urine: NEGATIVE
Casts: NONE SEEN [LPF]
Crystals: NONE SEEN [HPF]
Glucose, UA: NEGATIVE
Hgb urine dipstick: NEGATIVE
Ketones, ur: NEGATIVE
Leukocytes, UA: NEGATIVE
Nitrite: NEGATIVE
Protein, ur: NEGATIVE
Specific Gravity, Urine: 1.015 (ref 1.001–1.035)
Squamous Epithelial / LPF: NONE SEEN [HPF] (ref <=5)
WBC, UA: NONE SEEN WBC/HPF (ref <=5)
Yeast: NONE SEEN [HPF]
pH: 5.5 (ref 5.0–8.0)

## 2015-05-03 LAB — TSH: TSH: 2.489 u[IU]/mL (ref 0.350–4.500)

## 2015-05-04 LAB — URINE CULTURE
Colony Count: NO GROWTH
Organism ID, Bacteria: NO GROWTH

## 2015-05-06 ENCOUNTER — Telehealth: Payer: Self-pay | Admitting: General Practice

## 2015-05-06 ENCOUNTER — Telehealth: Payer: Self-pay | Admitting: *Deleted

## 2015-05-06 DIAGNOSIS — E785 Hyperlipidemia, unspecified: Secondary | ICD-10-CM

## 2015-05-06 NOTE — Telephone Encounter (Signed)
Caller name: Anderson Malta Relation to pt: dr Thermon Leyland gynecology Call back number: [phone]    Reason for call:  Dr Toney Rakes from [facility] gynecology is  referring patient. Patient has a high lipid panel and may have pancreatitis. Please advise

## 2015-05-06 NOTE — Telephone Encounter (Signed)
Please inform patient that her lipid profile is dangerously high and that she is at risk for Pancreatitis. Will need to refer her to Internist.   I called and spoke with Dr.Paz office and he is not accepting new patient appointment, but since it is being referred from our office she will send Dr.Paz a message and call me back with his response.

## 2015-05-06 NOTE — Telephone Encounter (Signed)
Please arrange office visit at the earliest patient's convenience. If she has pancreatitis: Abdominal pain, nausea, vomiting, fever >>> she needs to go to the ER.

## 2015-05-06 NOTE — Telephone Encounter (Signed)
Noted  

## 2015-05-06 NOTE — Telephone Encounter (Signed)
Patient has 2 scheduled appointments due to the below  1. 05/08/15 @ 1:00pm (acute appointment)  2. 08/19/15 @ 3:00pm as a new patient with Dr. Larose Kells office   [address], [place], [place] [address] # [phone]  If patient is experiencing any nausea, vomiting, or pain to be seen at ER.   I routed result note to Jenner to relay this information to patient.

## 2015-05-06 NOTE — Telephone Encounter (Signed)
lvm advising Taylor Parker to call back to advise of message below

## 2015-05-06 NOTE — Telephone Encounter (Signed)
Kaylyn as per our skpe discussion patient [facility] to 05/23/2015 at 11am 30 minute slot for new patient.

## 2015-05-08 ENCOUNTER — Ambulatory Visit: Payer: BLUE CROSS/BLUE SHIELD | Admitting: Internal Medicine

## 2015-05-14 ENCOUNTER — Ambulatory Visit
Admission: RE | Admit: 2015-05-14 | Discharge: 2015-05-14 | Disposition: A | Discharge status: Home / Self Care | Payer: BLUE CROSS/BLUE SHIELD | Source: Ambulatory Visit

## 2015-05-14 ENCOUNTER — Other Ambulatory Visit: Payer: Self-pay | Admitting: General Surgery

## 2015-05-14 DIAGNOSIS — N644 Mastodynia: Secondary | ICD-10-CM

## 2015-05-14 DIAGNOSIS — Z1231 Encounter for screening mammogram for malignant neoplasm of breast: Secondary | ICD-10-CM

## 2015-05-22 ENCOUNTER — Telehealth: Payer: Self-pay

## 2015-05-22 ENCOUNTER — Ambulatory Visit
Admission: RE | Admit: 2015-05-22 | Discharge: 2015-05-22 | Disposition: A | Discharge status: Home / Self Care | Payer: BLUE CROSS/BLUE SHIELD | Source: Ambulatory Visit | Attending: General Surgery | Admitting: General Surgery

## 2015-05-22 DIAGNOSIS — N644 Mastodynia: Secondary | ICD-10-CM

## 2015-05-23 ENCOUNTER — Ambulatory Visit (INDEPENDENT_AMBULATORY_CARE_PROVIDER_SITE_OTHER): Payer: BLUE CROSS/BLUE SHIELD | Admitting: Internal Medicine

## 2015-05-23 ENCOUNTER — Encounter: Payer: Self-pay | Admitting: Internal Medicine

## 2015-05-23 VITALS — BP 118/78 | HR 54 | Temp 97.7°F | Ht 64.0 in | Wt 156.5 lb

## 2015-05-23 DIAGNOSIS — M542 Cervicalgia: Secondary | ICD-10-CM | POA: Diagnosis not present

## 2015-05-23 DIAGNOSIS — Z09 Encounter for follow-up examination after completed treatment for conditions other than malignant neoplasm: Secondary | ICD-10-CM

## 2015-05-23 DIAGNOSIS — E785 Hyperlipidemia, unspecified: Secondary | ICD-10-CM

## 2015-05-23 LAB — LIPID PANEL
Cholesterol: 197 mg/dL (ref 0–200)
HDL: 36.4 mg/dL — ABNORMAL LOW (ref >39.00)
LDL Cholesterol: 124 mg/dL — ABNORMAL HIGH (ref 0–99)
NonHDL: 160.51
Total CHOL/HDL Ratio: 5
Triglycerides: 185 mg/dL — ABNORMAL HIGH (ref 0.0–149.0)
VLDL: 37 mg/dL (ref 0.0–40.0)

## 2015-05-23 LAB — HEMOGLOBIN A1C: Hgb A1c MFr Bld: 5.7 % (ref 4.6–6.5)

## 2015-05-23 NOTE — Progress Notes (Signed)
Pre visit review using our clinic review tool, if applicable. No additional management support is needed unless otherwise documented below in the visit note. 

## 2015-05-23 NOTE — Patient Instructions (Signed)
Get your blood work before you leave      Next visit  for a   Check up in 6 months    (15 minutes) Please schedule an appointment at the front desk Please come back fasting

## 2015-05-23 NOTE — Progress Notes (Signed)
Subjective:    Patient ID: Taylor Parker, female    DOB: [birth year], 49 y.o.   MRN: [id number]  DOS:  05/23/2015 Type of visit - description : New patient, referred by Dr. Toney Rakes due to high triglycerides Interval history: During her routine checkup, triglycerides were noted to be elevated. Currently the patient has a relatively healthy diet and is active without routine exercise.    Review of Systems Denies chest pain or difficulty breathing No nausea, vomiting, diarrhea. No abdominal pain. Reports occasional neck pain without radiation. For the last 4 weeks.  Past Medical History  Diagnosis Date  . Hyperlipidemia     diet controlled  . PONV (postoperative nausea and vomiting)     in the past  . Depression     no med  . PMDD (premenstrual dysphoric disorder)   . Headache(784.0)     otc med prn    Past Surgical History  Procedure Laterality Date  . Cesarean section      X 3  . Tubal ligation  2004  . Incise and drain abcess      right breast  . Breast surgery  8/12    right breast biopsy  . Breast biopsy  06/19/2011    Procedure: BREAST BIOPSY;  Surgeon: Rolm Bookbinder, MD;  Location: [facility];  Service: General;  Laterality: Right;  Right breast chronic abscess drainage and debridement  . Breast ductal system excision Left 01/24/2014    Procedure:  LEFT BREAST DUCT EXCISION;  Surgeon: Rolm Bookbinder, MD;  Location: [facility];  Service: General;  Laterality: Left;  . Dilatation & curettage/hysteroscopy with myosure N/A 05/10/2014    Procedure: DILATATION & CURETTAGE/HYSTEROSCOPY WITH MYOSURE ABLATION,RESECTOSCOPIC POLYPECTOMY;  Surgeon: Terrance Mass, MD;  Location: [facility];  Service: Gynecology;  Laterality: N/A;  Confirmed with Myosure rep to be present.    Social History   Social History  . Marital Status: Married    Spouse Name: N/A  . Number of Children: 3  . Years of Education: N/A   Occupational History  . [employer]   7pm-4 am    Social History Main Topics  . Smoking status: Never Smoker   . Smokeless tobacco: Never Used  . Alcohol Use: 0.0 oz/week    0 Standard drinks or equivalent per week     Comment: very seldom   . Drug Use: No  . Sexual Activity: Yes    Birth Control/ Protection: Surgical     Comment: BTL   Other Topics Concern  . Not on file   Social History Narrative   Original from [place]            Family History  Problem Relation Age of Onset  . Heart disease Mother   . Diabetes Other     gm  . Colon cancer Neg Hx   . Breast cancer Neg Hx   . High Cholesterol Brother     high TG       Medication List       This list is accurate as of: 05/23/15 11:59 PM.  Always use your most recent med list.               clonazePAM 0.5 MG tablet  Commonly known as:  KLONOPIN  Take 0.5 mg by mouth 2 (two) times daily as needed for anxiety.     hydrOXYzine 25 MG tablet  Commonly known as:  ATARAX/VISTARIL  Take 25 mg by mouth 3 (three) times  daily as needed.     sertraline 25 MG tablet  Commonly known as:  ZOLOFT  Take 25 mg by mouth daily.           Objective:   Physical Exam BP 118/78 mmHg  Pulse 54  Temp(Src) 97.7 F (36.5 C) (Oral)  Ht 5\' 4"  (1.626 m)  Wt 156 lb 8 oz (70.988 kg)  BMI 26.85 kg/m2  SpO2 98%  LMP 04/24/2015 General:   Well developed, well nourished . NAD.  HEENT:  Normocephalic . Face symmetric, atraumatic Neck: Full range of motion, no TTP. No thyromegaly Lungs:  CTA B Normal respiratory effort, no intercostal retractions, no accessory muscle use. Heart: RRR,  no murmur.  No pretibial edema bilaterally  Skin: Not pale. Not jaundice Neurologic:  alert & oriented X3.  Speech normal, gait appropriate for age and unassisted Psych--  Cognition and judgment appear intact.  Cooperative with normal attention span and concentration.  Behavior appropriate. No anxious or depressed appearing.      Assessment & Plan:   Assessment  > Dyslipidemia, high triglycerides Depression - sx controlled,sees another provider BTL  PLAN: Dyslipidemia: TGs have been gradually increasing per chart review, no history of diabetes or thyroid disease. Does not drink alcohol. Her brother has a history of high TG despite a excellent lifestyle. We had a careful discussion about risks of dyslipidemia including pancreatitis-CAD. We also talk about diet and exercise. The last time her blood was check she was 6 hours fasting, now she is 12 hour fasting. Will check a cholesterol panel A1c, RTC 6 months to reassess. Mild neck pain for 4 weeks: Stretching discuss, take Tylenol or Motrin, call if symptoms persist or severe RTC 6 months

## 2015-05-24 DIAGNOSIS — Z09 Encounter for follow-up examination after completed treatment for conditions other than malignant neoplasm: Secondary | ICD-10-CM | POA: Insufficient documentation

## 2015-05-24 NOTE — Assessment & Plan Note (Signed)
Dyslipidemia: TGs have been gradually increasing per chart review, no history of diabetes or thyroid disease. Does not drink alcohol. Her brother has a history of high TG despite a excellent lifestyle. We had a careful discussion about risks of dyslipidemia including pancreatitis-CAD. We also talk about diet and exercise. The last time her blood was check she was 6 hours fasting, now she is 12 hour fasting. Will check a cholesterol panel A1c, RTC 6 months to reassess. Mild neck pain for 4 weeks: Stretching discuss, take Tylenol or Motrin, call if symptoms persist or severe RTC 6 months

## 2015-05-27 NOTE — Telephone Encounter (Signed)
Unable to complete pr-visit call. Language barrier.

## 2015-08-19 ENCOUNTER — Ambulatory Visit: Payer: BLUE CROSS/BLUE SHIELD | Admitting: Internal Medicine

## 2015-08-20 ENCOUNTER — Ambulatory Visit: Payer: BLUE CROSS/BLUE SHIELD | Admitting: Gynecology

## 2015-11-19 ENCOUNTER — Ambulatory Visit (HOSPITAL_BASED_OUTPATIENT_CLINIC_OR_DEPARTMENT_OTHER)
Admission: RE | Admit: 2015-11-19 | Discharge: 2015-11-19 | Disposition: A | Discharge status: Home / Self Care | Payer: BLUE CROSS/BLUE SHIELD | Source: Ambulatory Visit | Attending: Internal Medicine | Admitting: Internal Medicine

## 2015-11-19 ENCOUNTER — Encounter: Payer: Self-pay | Admitting: Internal Medicine

## 2015-11-19 ENCOUNTER — Ambulatory Visit (INDEPENDENT_AMBULATORY_CARE_PROVIDER_SITE_OTHER): Payer: BLUE CROSS/BLUE SHIELD | Admitting: Internal Medicine

## 2015-11-19 VITALS — BP 108/62 | HR 56 | Temp 98.1°F | Ht 64.0 in | Wt 153.2 lb

## 2015-11-19 DIAGNOSIS — G43909 Migraine, unspecified, not intractable, without status migrainosus: Secondary | ICD-10-CM | POA: Insufficient documentation

## 2015-11-19 DIAGNOSIS — E785 Hyperlipidemia, unspecified: Secondary | ICD-10-CM

## 2015-11-19 DIAGNOSIS — Z09 Encounter for follow-up examination after completed treatment for conditions other than malignant neoplasm: Secondary | ICD-10-CM

## 2015-11-19 DIAGNOSIS — G43009 Migraine without aura, not intractable, without status migrainosus: Secondary | ICD-10-CM

## 2015-11-19 LAB — LIPID PANEL
Cholesterol: 204 mg/dL — ABNORMAL HIGH (ref 0–200)
HDL: 51.3 mg/dL (ref >39.00)
LDL Cholesterol: 127 mg/dL — ABNORMAL HIGH (ref 0–99)
NonHDL: 152.86
Total CHOL/HDL Ratio: 4
Triglycerides: 129 mg/dL (ref 0.0–149.0)
VLDL: 25.8 mg/dL (ref 0.0–40.0)

## 2015-11-19 NOTE — Progress Notes (Signed)
Pre visit review using our clinic review tool, if applicable. No additional management support is needed unless otherwise documented below in the visit note. 

## 2015-11-19 NOTE — Patient Instructions (Signed)
GO TO THE LAB : Get the blood work     GO TO THE FRONT DESK Schedule your next appointment for a  routine checkup in 6-8 months. Fasting    We are scheduling a CT of the head

## 2015-11-19 NOTE — Assessment & Plan Note (Signed)
Dyslipidemia: TGs improved the last time we checked, due to a  better diet and exercise, recommend to do the best she can with lifestyle, check a FLP.  Migraine headaches: see description of sx above, headache likely a migraine, no previous diagnosis or imaging, this time headache was particularly severe. Plan: OTC Tylenol or Motrin as soon as the headache starts, call if symptoms intense or not responding to OTCs. Check a CT head. Nausea was probably related to migraine. RTC 6 months

## 2015-11-19 NOTE — Progress Notes (Signed)
Subjective:    Patient ID: Taylor Parker, female    DOB: 03-Apr-1966, 50 y.o.   MRN: [id number]  DOS:  11/19/2015 Type of visit - description : Follow-up Interval history: High triglycerides: Since the last time she was here her diet is okay, maybe not as good as few months ago. She is able to exercise 3 times a week for 20 minutes. Her main concern however was a headache and nausea. 5 days ago developed a frontal HA associated with epigastric burning pain and nausea. The headache was similar to previous episodes, not the worst of her life however it did last 2 days which is uncommon. She felt slightly dizzy and had blurred vision, intensity at his peak was 8/10 She took some Prilosec for nausea;  2 days later all symptoms resolved.  On further asking, she does have chronic HAs on and off for years.  Review of Systems  Denies blood in the stools No neck stiffness No URI type of symptoms Past Medical History  Diagnosis Date  . Hyperlipidemia     diet controlled  . PONV (postoperative nausea and vomiting)     in the past  . Depression     no med  . PMDD (premenstrual dysphoric disorder)   . Headache(784.0)     otc med prn    Past Surgical History  Procedure Laterality Date  . Cesarean section      X 3  . Tubal ligation  2004  . Incise and drain abcess      right breast  . Breast surgery  8/12    right breast biopsy  . Breast biopsy  06/19/2011    Procedure: BREAST BIOPSY;  Surgeon: Rolm Bookbinder, MD;  Location: [facility];  Service: General;  Laterality: Right;  Right breast chronic abscess drainage and debridement  . Breast ductal system excision Left 01/24/2014    Procedure:  LEFT BREAST DUCT EXCISION;  Surgeon: Rolm Bookbinder, MD;  Location: [facility];  Service: General;  Laterality: Left;  . Dilatation & curettage/hysteroscopy with myosure N/A 05/10/2014    Procedure: DILATATION & CURETTAGE/HYSTEROSCOPY WITH MYOSURE ABLATION,RESECTOSCOPIC  POLYPECTOMY;  Surgeon: Terrance Mass, MD;  Location: [facility];  Service: Gynecology;  Laterality: N/A;  Confirmed with Myosure rep to be present.    Social History   Social History  . Marital Status: Married    Spouse Name: N/A  . Number of Children: 3  . Years of Education: N/A   Occupational History  . [employer]  7pm-4 am    Social History Main Topics  . Smoking status: Never Smoker   . Smokeless tobacco: Never Used  . Alcohol Use: 0.0 oz/week    0 Standard drinks or equivalent per week     Comment: very seldom   . Drug Use: No  . Sexual Activity: Yes    Birth Control/ Protection: Surgical     Comment: BTL   Other Topics Concern  . Not on file   Social History Narrative   Original from [place]               Medication List       This list is accurate as of: 11/19/15  4:43 PM.  Always use your most recent med list.               sertraline 25 MG tablet  Commonly known as:  ZOLOFT  Take 25 mg by mouth daily.     traZODone 100 MG  tablet  Commonly known as:  DESYREL  Take 100 mg by mouth at bedtime as needed for sleep.           Objective:   Physical Exam BP 108/62 mmHg  Pulse 56  Temp(Src) 98.1 F (36.7 C) (Oral)  Ht 5\' 4"  (1.626 m)  Wt 153 lb 4 oz (69.514 kg)  BMI 26.29 kg/m2  SpO2 97%  LMP 11/08/2015 (Approximate) General:   Well developed, well nourished . NAD.  HEENT:  Normocephalic . Face symmetric, atraumatic. Nose not congested Lungs:  CTA B Normal respiratory effort, no intercostal retractions, no accessory muscle use. Heart: RRR,  no murmur.  No pretibial edema bilaterally  Skin: Not pale. Not jaundice Neurologic:  alert & oriented X3.  Speech normal, gait appropriate for age and unassisted Neck full range of motion Pupils equal and reactive. DTRs and motor symmetric Psych--  Cognition and judgment appear intact.  Cooperative with normal attention span and concentration.  Behavior appropriate. No anxious or depressed  appearing.      Assessment & Plan:   Assessment > Dyslipidemia, high triglycerides Depression - sx controlled,sees another provider BTL  PLAN: Dyslipidemia: TGs improved the last time we checked, due to a  better diet and exercise, recommend to do the best she can with lifestyle, check a FLP.  Migraine headaches: see description of sx above, headache likely a migraine, no previous diagnosis or imaging, this time headache was particularly severe. Plan: OTC Tylenol or Motrin as soon as the headache starts, call if symptoms intense or not responding to OTCs. Check a CT head. Nausea was probably related to migraine. RTC 6 months

## 2016-01-29 ENCOUNTER — Encounter: Payer: Self-pay | Admitting: Vascular Surgery

## 2016-01-29 ENCOUNTER — Ambulatory Visit (HOSPITAL_COMMUNITY)
Admission: RE | Admit: 2016-01-29 | Discharge: 2016-01-29 | Disposition: A | Discharge status: Home / Self Care | Payer: BLUE CROSS/BLUE SHIELD | Source: Ambulatory Visit | Attending: Vascular Surgery | Admitting: Vascular Surgery

## 2016-01-29 ENCOUNTER — Other Ambulatory Visit: Payer: Self-pay | Admitting: Vascular Surgery

## 2016-01-29 ENCOUNTER — Ambulatory Visit (INDEPENDENT_AMBULATORY_CARE_PROVIDER_SITE_OTHER): Payer: BLUE CROSS/BLUE SHIELD | Admitting: Vascular Surgery

## 2016-01-29 VITALS — BP 95/56 | HR 61 | Temp 97.9°F | Ht 62.0 in | Wt 158.0 lb

## 2016-01-29 DIAGNOSIS — E785 Hyperlipidemia, unspecified: Secondary | ICD-10-CM | POA: Insufficient documentation

## 2016-01-29 DIAGNOSIS — M7989 Other specified soft tissue disorders: Secondary | ICD-10-CM

## 2016-01-29 DIAGNOSIS — R600 Localized edema: Secondary | ICD-10-CM | POA: Diagnosis present

## 2016-01-29 DIAGNOSIS — M79605 Pain in left leg: Secondary | ICD-10-CM | POA: Diagnosis not present

## 2016-01-29 DIAGNOSIS — I8392 Asymptomatic varicose veins of left lower extremity: Secondary | ICD-10-CM | POA: Diagnosis not present

## 2016-01-29 DIAGNOSIS — F329 Major depressive disorder, single episode, unspecified: Secondary | ICD-10-CM | POA: Diagnosis not present

## 2016-01-29 DIAGNOSIS — M79662 Pain in left lower leg: Secondary | ICD-10-CM

## 2016-01-29 DIAGNOSIS — I872 Venous insufficiency (chronic) (peripheral): Secondary | ICD-10-CM

## 2016-01-29 NOTE — Progress Notes (Signed)
[facility]  Patient name: Taylor Parker MRN: [id number] DOB: [birth year] Sex: female  REASON FOR VISIT: Follow up of chronic venous insufficiency.  HPI: Taylor Parker is a 50 y.o. female who has been having pain and swelling in the left lower extremity. She works at a [employer] and stands for most of the day. She experiences significant aching and heaviness in the left lower extremity which is aggravated by standing and relieved with elevation. She has minimal symptoms in the right lower extremity.  She did have a DVT in the left lower extremity after her third pregnancy many years ago in [place]. She was on Coumadin during that time. She is currently not on Coumadin.  I had previously seen her in October 2015 with some deep vein reflux on the right side but no significant superficial reflux.  Past Medical History  Diagnosis Date  . Hyperlipidemia     diet controlled  . PONV (postoperative nausea and vomiting)     in the past  . Depression     no med  . PMDD (premenstrual dysphoric disorder)   . Headache(784.0)     otc med prn    Family History  Problem Relation Age of Onset  . Heart disease Mother   . Diabetes Other     gm  . Colon cancer Neg Hx   . Breast cancer Neg Hx   . High Cholesterol Brother     high TG    SOCIAL HISTORY: Social History  Substance Use Topics  . Smoking status: Never Smoker   . Smokeless tobacco: Never Used  . Alcohol Use: 0.0 oz/week    0 Standard drinks or equivalent per week     Comment: very seldom     No Known Allergies  Current Outpatient Prescriptions  Medication Sig Dispense Refill  . clonazePAM (KLONOPIN) 0.5 MG tablet Take 0.5 mg by mouth at bedtime.    . sertraline (ZOLOFT) 25 MG tablet Take 25 mg by mouth daily.    . traZODone (DESYREL) 100 MG tablet Take 100 mg by mouth at bedtime as needed for sleep. Reported on 01/29/2016     No current facility-administered  medications for this visit.    REVIEW OF SYSTEMS:  [X]  denotes positive finding, [ ]  denotes negative finding Cardiac  Comments:  Chest pain or chest pressure:    Shortness of breath upon exertion:    Short of breath when lying flat:    Irregular heart rhythm:        Vascular    Pain in calf, thigh, or hip brought on by ambulation:    Pain in feet at night that wakes you up from your sleep:     Blood clot in your veins:    Leg swelling:  X       Pulmonary    Oxygen at home:    Productive cough:     Wheezing:         Neurologic    Sudden weakness in arms or legs:     Sudden numbness in arms or legs:     Sudden onset of difficulty speaking or slurred speech:    Temporary loss of vision in one eye:     Problems with dizziness:         Gastrointestinal    Blood in stool:     Vomited blood:         Genitourinary    Burning when urinating:  Blood in urine:        Psychiatric    Major depression:         Hematologic    Bleeding problems:    Problems with blood clotting too easily:        Skin    Rashes or ulcers:        Constitutional    Fever or chills:      PHYSICAL EXAM: Filed Vitals:   01/29/16 1443  BP: 95/56  Pulse: 61  Temp: 97.9 F (36.6 C)  TempSrc: Oral  Height: 5\' 2"  (1.575 m)  Weight: 158 lb (71.668 kg)  SpO2: 96%    GENERAL: The patient is a well-nourished female, in no acute distress. The vital signs are documented above. CARDIAC: There is a regular rate and rhythm.  VASCULAR: I do not take carotid bruits. She has palpable pedal pulses. She has mild left lower extremity swelling. PULMONARY: There is good air exchange bilaterally without wheezing or rales. ABDOMEN: Soft and non-tender with normal pitched bowel sounds.  MUSCULOSKELETAL: There are no major deformities or cyanosis. NEUROLOGIC: No focal weakness or paresthesias are detected. SKIN: She has a cluster of varicose veins in her medial left calf. There is no evidence of  superficial thrombophlebitis. PSYCHIATRIC: The patient has a normal affect.  DATA:   LOWER EXTREMITY VENOUS REFLUX STUDY: I have independently interpreted her lower extremity venous reflux study. On the left side, there is no evidence of DVT or superficial thrombophlebitis. There is reflux in the deep system on the left involving the common femoral vein, superficial femoral vein, and popliteal vein. There is a short segment of reflux in the left great saphenous vein only. There is also some reflux in the short saphenous vein although the vein is not especially enlarged.  MEDICAL ISSUES:  CHRONIC VENOUS INSUFFICIENCY: This patient has significant chronic venous insufficiency. I've explained that this is a chronic problem. We have discussed the importance of intermittent leg elevation and the proper positioning for this. In addition, I have discussed the importance of wearing compression stockings when she will be standing or sitting for a long time.  I have encouraged her to stay as active as possible and to avoid prolonged sitting and standing. We have also discussed water aerobics which I think is also very helpful for people with chronic venous insufficiency. Currently she is not a candidate for laser ablation of the short saphenous vein are great saphenous vein. If her varicose veins and symptoms progress in the future she could be considered for thigh-high compression stockings with a gradient of 20-30 mmHg. I will see her back as needed.     Deitra Mayo [facility] [place] [phone]

## 2016-05-04 ENCOUNTER — Encounter: Payer: BLUE CROSS/BLUE SHIELD | Admitting: Gynecology

## 2016-05-06 ENCOUNTER — Encounter: Payer: BLUE CROSS/BLUE SHIELD | Admitting: Gynecology

## 2016-05-19 ENCOUNTER — Ambulatory Visit (INDEPENDENT_AMBULATORY_CARE_PROVIDER_SITE_OTHER): Payer: BLUE CROSS/BLUE SHIELD | Admitting: Internal Medicine

## 2016-05-19 ENCOUNTER — Encounter: Payer: Self-pay | Admitting: Gastroenterology

## 2016-05-19 ENCOUNTER — Ambulatory Visit (INDEPENDENT_AMBULATORY_CARE_PROVIDER_SITE_OTHER): Payer: BLUE CROSS/BLUE SHIELD | Admitting: Gynecology

## 2016-05-19 ENCOUNTER — Other Ambulatory Visit: Payer: Self-pay | Admitting: Gynecology

## 2016-05-19 ENCOUNTER — Encounter: Payer: Self-pay | Admitting: Gynecology

## 2016-05-19 ENCOUNTER — Encounter: Payer: Self-pay | Admitting: Internal Medicine

## 2016-05-19 VITALS — BP 118/70 | HR 58 | Temp 98.1°F | Resp 12 | Ht 62.0 in | Wt 162.1 lb

## 2016-05-19 VITALS — BP 120/76 | Ht 64.0 in | Wt 164.0 lb

## 2016-05-19 DIAGNOSIS — E785 Hyperlipidemia, unspecified: Secondary | ICD-10-CM

## 2016-05-19 DIAGNOSIS — N393 Stress incontinence (female) (male): Secondary | ICD-10-CM | POA: Diagnosis not present

## 2016-05-19 DIAGNOSIS — Z1231 Encounter for screening mammogram for malignant neoplasm of breast: Secondary | ICD-10-CM

## 2016-05-19 DIAGNOSIS — Z86718 Personal history of other venous thrombosis and embolism: Secondary | ICD-10-CM | POA: Diagnosis not present

## 2016-05-19 DIAGNOSIS — Z01411 Encounter for gynecological examination (general) (routine) with abnormal findings: Secondary | ICD-10-CM

## 2016-05-19 DIAGNOSIS — R351 Nocturia: Secondary | ICD-10-CM | POA: Diagnosis not present

## 2016-05-19 NOTE — Progress Notes (Signed)
Taylor Parker April 27, 1966 [id number]   History:    50 y.o.  for annual gyn exam who states that for the past several months she has had to wear a pad because when she sneezes or coughs or laughs or bends over she leaks urine. She gets up at night to urinate twice during the evening. She does not have any urgency or urgency incontinence. She denies any dysuria. She's had 3 pregnancies all 3 delivered via cesarean section. Her PCP is Dr. Larose Kells who has been doing her blood work. She declined the flu vaccine today.On 05/10/2014 patient underwent a resectoscopic polypectomy as a result of endometrial polyp and dysfunctional uterine bleeding. Pathology report was benign. Patient now reports normal menstrual cycles. She still has not had her colonoscopy and is overdue for mammogram. Patient with no prior history of any abnormal Pap smears.  Past medical history,surgical history, family history and social history were all reviewed and documented in the EPIC chart.  Gynecologic History Patient's last menstrual period was 05/06/2016. Contraception: tubal ligation Last Pap: 2015. Results were: normal Last mammogram: 2016. Results were: Had three-dimensional mammogram normal has dense breasts  Obstetric History OB History  Gravida Para Term Preterm AB Living  3 3 3     3   SAB TAB Ectopic Multiple Live Births          3    # Outcome Date GA Lbr Len/2nd Weight Sex Delivery Anes PTL Lv  3 Term     M CS-Unspec  N LIV  2 Term     M CS-Unspec  N LIV  1 Term     F CS-Unspec  N LIV       ROS: A ROS was performed and pertinent positives and negatives are included in the history.  GENERAL: No fevers or chills. HEENT: No change in vision, no earache, sore throat or sinus congestion. NECK: No pain or stiffness. CARDIOVASCULAR: No chest pain or pressure. No palpitations. PULMONARY: No shortness of breath, cough or wheeze. GASTROINTESTINAL: No abdominal pain, nausea, vomiting or diarrhea, melena or  bright red blood per rectum. GENITOURINARY: No urinary frequency, urgency, hesitancy or dysuria. MUSCULOSKELETAL: No joint or muscle pain, no back pain, no recent trauma. DERMATOLOGIC: No rash, no itching, no lesions. ENDOCRINE: No polyuria, polydipsia, no heat or cold intolerance. No recent change in weight. HEMATOLOGICAL: No anemia or easy bruising or bleeding. NEUROLOGIC: No headache, seizures, numbness, tingling or weakness. PSYCHIATRIC: No depression, no loss of interest in normal activity or change in sleep pattern.     Exam: chaperone present  BP 120/76   Ht 5\' 4"  (1.626 m)   Wt 164 lb (74.4 kg)   LMP 05/06/2016   BMI 28.15 kg/m   Body mass index is 28.15 kg/m.  General appearance : Well developed well nourished female. No acute distress HEENT: Eyes: no retinal hemorrhage or exudates,  Neck supple, trachea midline, no carotid bruits, no thyroidmegaly Lungs: Clear to auscultation, no rhonchi or wheezes, or rib retractions  Heart: Regular rate and rhythm, no murmurs or gallops Breast:Examined in sitting and supine position were symmetrical in appearance, no palpable masses or tenderness,  no skin retraction, no nipple inversion, no nipple discharge, no skin discoloration, no axillary or supraclavicular lymphadenopathy Abdomen: no palpable masses or tenderness, no rebound or guarding Extremities: no edema or skin discoloration or tenderness  Pelvic:  Bartholin, Urethra, [place] Glands: Within normal limits             Vagina: No  gross lesions or discharge  Cervix: No gross lesions or discharge  Uterus  anteverted, normal size, shape and consistency, non-tender and mobile  Adnexa  Without masses or tenderness  Anus and perineum  normal   Rectovaginal  normal sphincter tone without palpated masses or tenderness             Hemoccult will schedule colonoscopy for this year   Patient was evaluated for stress urinary incontinence and the following manner: In the supine position  patient was asked to cough and there down she did not leak any urine. There was no evidence of cystocele rectocele. A sterile Q-tip was placed in the urethra impregnated with lidocaine. When she was asked to bear down there was greater than a 30 change in the urethrovesical angle indicating hypermobility of the urethra. She was asked to stand there was no evidence of any uterine descensus when she coughed or bear down she didn't leak urine.  Assessment/Plan:  50 y.o. female for annual exam overdue for her colonoscopy she was provided with the name of community gastroenterologist. Patient with clear clinical evidence of Genuine stress urinary incontinence will be referred to the urologist for further urodynamic testing and consideration for retropubic sling procedure. Patient refused the flu vaccine today. Pap smear due next year. She was provided also with a requisition to schedule her mammogram as well.   Terrance Mass MD, 2:34 PM 05/19/2016

## 2016-05-19 NOTE — Patient Instructions (Signed)
Next visit 1 year

## 2016-05-19 NOTE — Patient Instructions (Signed)
Estudios urodinmicos (Urodynamic Testing) QU SON LOS ESTUDIOS URODINMICOS? Los estudios urodinmicos son Ardelia Mems serie de estudios y Manufacturing engineer. Estos estudios ayudan a Teacher, adult education si la vejiga y el conducto a travs del cual esta se vaca (uretra) funcionan correctamente.  POR QU DEBO REALIZARME ESTOS ESTUDIOS?  Tal vez deba realizarse estos estudios si:  Tiene prdidas de [place].  Tiene dificultad para comenzar o Copy.  Orina con frecuencia o tiene dolor al [employer].  Tiene infecciones urinarias frecuentes.  No puede vaciar la vejiga.  Tiene deseos imperiosos de Garment/textile technologist.  El chorro de [place] es dbil. CMO SE REALIZAN LOS ESTUDIOS? Los estudios pueden realizarse en una o en varias visitas. Podrn administrarle medicamentos que combaten los grmenes (antibiticos) para evitar infecciones. Pregntele al mdico si debe hacer lo siguiente:  Suspender alguno de los medicamentos que toma habitualmente.  Llegar al estudio con deseos de orinar. Los estudios pueden realizarse para Teacher, adult education lo siguiente:  La cantidad que [place] y cunto Chartered loss adjuster.  La cantidad de orina que queda en la vejiga despus de la miccin.  La presin que hay en la vejiga antes de que orine y mientas lo hace.  La actividad de los nervios y los msculos de la vejiga, y del conducto a travs del cual esta se vaca. CULES SON LOS RIESGOS DE LOS ESTUDIOS? Generalmente, los estudios son seguros. Pueden presentarse problemas. Puede tener lo siguiente:  Molestias.  Necesidad de Garment/textile technologist con frecuencia.  Hemorragia.  Infeccin.  Una reaccin alrgica a los medicamentos [employer]. [place] DE LOS ESTUDIOS?  Debe estar en condiciones de regresar a su casa de inmediato.  Puede realizar sus actividades habituales.  Tal vez le indiquen que tome un vaso grande de agua cada 40minutos. Respete esta indicacin durante las primeras 2horas que est en su  casa.  Un bao caliente o una toalla tibia pueden aliviar las molestias. Informe al mdico si tiene lo siguiente:  Dolor.  Sangre en la orina.  Escalofros.  Cristy Hilts. [employer] DE LOS ESTUDIOS? Hable con el mdico [employer]. Estos estudios pueden ayudar a que el mdico determine si la vejiga y el conducto a travs del cual esta se vaca funcionan correctamente. Los [employer] y los sntomas pueden ayudar al mdico a Neurosurgeon qu puede estar [employer].   Esta informacin no tiene Marine scientist el consejo del mdico. Asegrese de hacerle al mdico cualquier pregunta que tenga.   Document Released: 08/01/2010 Document Revised: 07/20/2014 Elsevier Interactive Patient Education 2016 [place] (Urinary Incontinence) La incontinencia urinaria es la prdida involuntaria de orina de la vejiga. CAUSAS  Hay muchas causas de incontinencia urinaria. Ellas son:  Medicamentos.  Infecciones.  Agrandamiento prosttico que causa un aumento del flujo de [place] de la vejiga.  Ciruga.  Enfermedades neurolgicas.  Factores emocionales. SIGNOS Y SNTOMAS Hay cuatro tipos de incontinencia urinaria: 1. Incontinencia urinaria de urgencia: es la prdida involuntaria de orina antes de llegar al bao. Hay una urgencia repentina de orinar pero no llega a tiempo al bao. 2. Incontinencia por estrs: es la prdida repentina de orina con cualquier actividad que fuerza a Art gallery manager orina. La causa ms frecuente son los cambios anatmicos en la pelvis y las zonas del esfnter. 3. Incontinencia por rebosamiento: es la prdida de orina por una obstruccin de la apertura de la vejiga. Esto causa un retroceso de la orina y una acumulacin de presin dentro de la vejiga.  Cuando la presin dentro de la vejiga excede la presin que Engineer, manufacturing systems, la orina rebalsa y causa incontinencia, similar al desbordamiento de un  dique. 4. Incontinencia total: es la prdida de orina como resultado de la incapacidad de Financial controller orina dentro de la vejiga. DIAGNSTICO  La evaluacin de la causa puede requerir:  Ardelia Mems historia mdica y obsttrica exhaustiva y [place].  Examen fsico completo.  Anlisis de laboratorio como un urocultivo y [place]. Cuando se indican estudios adicionales, pueden incluirse:  Una ecografa.  Radiografas de vejiga y riones.  Una cistoscopa. Consiste en un examen de la vejiga utilizando un telescopio pequeo.  Estudios urodinmicos para comprobar la funcin nerviosa de la vejiga y Social worker. TRATAMIENTO  El tratamiento de la incontinencia urinaria depende de la causa:  Para la incontinencia urinaria causada por una infeccin urinaria, le indicarn antibiticos. Si la incontinencia urinaria se relaciona con los [employer] toma, el mdico podr Public relations account executive.  Para la incontinencia por estrs, en necesaria una ciruga para restablecer el soporte anatmico de la vejiga o el esfnter, o ambos, lo que Clinical research associate.  Para la incontinencia por rebosamiento causada por una prstata agrandada, una operacin para abrir el canal a travs de la prstata agrandada permitir que el flujo de orina salga de la vejiga. En las mujeres con fibromas, ser necesaria una histerectoma.  Para la incontinencia total, una ciruga del esfnter podr ayudar. Puede ser necesario colocar un esfnter urinario artificial (se coloca un manguito inflable alrededor de Geologist, engineering). En las mujeres que hayan desarrollado un pasaje similar a un orificio entre la vejiga y la vagina (fstula vesicovaginal ), ser necesario realizar una ciruga para cerrar la fstula. INSTRUCCIONES PARA EL CUIDADO EN EL HOGAR  La higiene diaria normal y el uso regular de apsitos o paales para adultos cambiados con regularidad ayudan a prevenir olores y daos en la piel.  Evite la cafena. Puede  sobreestimular la vejiga.  Use el bao con regularidad. Trate de ir al bao cada 2  3 horas, aunque no sienta la necesidad. Tmese el tiempo para vaciar la vejiga completamente. Despus de orinar, espere un minuto. Luego trate de orinar nuevamente.  Para las causas que implican una disfuncin nerviosa, lleve un registro de los medicamentos que toma y un diario de las veces que va al bao. SOLICITE ATENCIN MDICA SI:  Despus del procedimiento, experimenta un empeoramiento del dolor en vez de mejorar.  La incontinencia empeora en vez de mejorar. SOLICITE ATENCIN MDICA DE INMEDIATO SI:  Le sube la fiebre o tiene escalofros.  No puede orinar.  Observa irritacin en la ingle o baja hacia los muslos. ASEGRESE DE QUE:   Comprende estas instrucciones.   Controlar su afeccin.  Recibir ayuda de inmediato si no mejora o si empeora.   Esta informacin no tiene Marine scientist el consejo del mdico. Asegrese de hacerle al mdico cualquier pregunta que tenga.   Document Released: 06/29/2005 Document Revised: 07/20/2014 Elsevier Interactive Patient Education [employer].

## 2016-05-19 NOTE — Progress Notes (Signed)
Pre visit review using our clinic review tool, if applicable. No additional management support is needed unless otherwise documented below in the visit note. 

## 2016-05-19 NOTE — Progress Notes (Signed)
Subjective:    Patient ID: Taylor Parker, female    DOB: [birth year], 50 y.o.   MRN: [id number]  DOS:  05/19/2016 Type of visit - description : Routine checkup Interval history: Since the last office visit, headaches have not come back. Dyslipidemia: Continue to do well with diet and exercise Depression: Currently on no medications and feeling well.   Review of Systems   Past Medical History:  Diagnosis Date  . Depression    no med  . Headache(784.0)    otc med prn  . Hyperlipidemia    diet controlled  . PMDD (premenstrual dysphoric disorder)   . PONV (postoperative nausea and vomiting)    in the past    Past Surgical History:  Procedure Laterality Date  . BREAST BIOPSY  06/19/2011   Procedure: BREAST BIOPSY;  Surgeon: Rolm Bookbinder, MD;  Location: [facility];  Service: General;  Laterality: Right;  Right breast chronic abscess drainage and debridement  . BREAST DUCTAL SYSTEM EXCISION Left 01/24/2014   Procedure:  LEFT BREAST DUCT EXCISION;  Surgeon: Rolm Bookbinder, MD;  Location: [facility];  Service: General;  Laterality: Left;  . BREAST SURGERY  8/12   right breast biopsy  . CESAREAN SECTION     X 3  . DILATATION & CURETTAGE/HYSTEROSCOPY WITH MYOSURE N/A 05/10/2014   Procedure: DILATATION & CURETTAGE/HYSTEROSCOPY WITH MYOSURE ABLATION,RESECTOSCOPIC POLYPECTOMY;  Surgeon: Terrance Mass, MD;  Location: [facility];  Service: Gynecology;  Laterality: N/A;  Confirmed with [employer] rep to be present.  . INCISE AND DRAIN ABCESS     right breast  . TUBAL LIGATION  2004    Social History   Social History  . Marital status: Married    Spouse name: N/A  . Number of children: 3  . Years of education: N/A   Occupational History  . [employer]  7pm-4 am    Social History Main Topics  . Smoking status: Never Smoker  . Smokeless tobacco: Never Used  . Alcohol use 0.0 oz/week     Comment: very seldom   . Drug use: No  . Sexual activity: Yes   Birth control/ protection: Surgical     Comment: BTL   Other Topics Concern  . Not on file   Social History Narrative   Original from [place]               Medication List    as of 05/19/2016  5:49 PM   You have not been prescribed any medications.        Objective:   Physical Exam BP 118/70 (BP Location: Left Arm, Patient Position: Sitting, Cuff Size: Normal)   Pulse (!) 58   Temp 98.1 F (36.7 C) (Oral)   Resp 12   Ht 5\' 2"  (1.575 m)   Wt 162 lb 2 oz (73.5 kg)   LMP 05/09/2016 (Exact Date)   SpO2 97%   BMI 29.65 kg/m  General:   Well developed, well nourished . NAD.  HEENT:  Normocephalic . Face symmetric, atraumatic Lungs:  CTA B Normal respiratory effort, no intercostal retractions, no accessory muscle use. Heart: RRR,  no murmur.  No pretibial edema bilaterally  Skin: Not pale. Not jaundice Neurologic:  alert & oriented X3.  Speech normal, gait appropriate for age and unassisted Psych--  Cognition and judgment appear intact.  Cooperative with normal attention span and concentration.  Behavior appropriate. No anxious or depressed appearing.      Assessment & Plan:  Assessment >  Dyslipidemia, high triglycerides Depression - sx controlled,sees another provider Headache: CT 11/2015 negative BTL  PLAN: Dyslipidemia: Last triglycerides were excellent, recommend to continue with her healthier diet and exercise. Come back in one year however if labs are good at her gynecologist, return to clinic as needed. Migraine headaches: CT was negative, sx resolved Depression: sx resolved, currently taking no medications RTC one year

## 2016-05-19 NOTE — Assessment & Plan Note (Signed)
PLAN: Dyslipidemia: Last triglycerides were excellent, recommend to continue with her healthier diet and exercise. Come back in one year however if labs are good at her gynecologist, return to clinic as needed. Migraine headaches: CT was negative, sx resolved Depression: sx resolved, currently taking no medications RTC one year

## 2016-05-20 ENCOUNTER — Encounter: Payer: Self-pay | Admitting: Gastroenterology

## 2016-05-20 ENCOUNTER — Telehealth: Payer: Self-pay | Admitting: *Deleted

## 2016-05-20 NOTE — Telephone Encounter (Signed)
-----   Message from Terrance Mass, MD sent at 05/19/2016  2:37 PM EST ----- Please make an appointment for this patient at [facility] try first Dr. McDiarimid and Gaynelle Arabian is not first available. Patient with Genuine stress urinary incontinence candidate for retropubic sling

## 2016-05-20 NOTE — Telephone Encounter (Signed)
Notes faxed to [facility] they will fax me back with time and date to relay to patient.  

## 2016-05-25 NOTE — Telephone Encounter (Signed)
Appointment on 07/03/16 @ 12:45 with Dr.MacDiarmid pt informed by [facility]

## 2016-05-26 ENCOUNTER — Ambulatory Visit
Admission: RE | Admit: 2016-05-26 | Discharge: 2016-05-26 | Disposition: A | Discharge status: Home / Self Care | Payer: BLUE CROSS/BLUE SHIELD | Source: Ambulatory Visit | Attending: Gynecology | Admitting: Gynecology

## 2016-05-26 DIAGNOSIS — Z1231 Encounter for screening mammogram for malignant neoplasm of breast: Secondary | ICD-10-CM

## 2016-05-29 ENCOUNTER — Other Ambulatory Visit: Payer: Self-pay | Admitting: Gynecology

## 2016-05-29 DIAGNOSIS — R928 Other abnormal and inconclusive findings on diagnostic imaging of breast: Secondary | ICD-10-CM

## 2016-06-08 ENCOUNTER — Ambulatory Visit
Admission: RE | Admit: 2016-06-08 | Discharge: 2016-06-08 | Disposition: A | Discharge status: Home / Self Care | Payer: BLUE CROSS/BLUE SHIELD | Source: Ambulatory Visit | Attending: Gynecology | Admitting: Gynecology

## 2016-06-08 DIAGNOSIS — R928 Other abnormal and inconclusive findings on diagnostic imaging of breast: Secondary | ICD-10-CM

## 2016-06-22 ENCOUNTER — Ambulatory Visit (AMBULATORY_SURGERY_CENTER): Payer: Self-pay | Admitting: *Deleted

## 2016-06-22 VITALS — Ht 62.0 in | Wt 162.2 lb

## 2016-06-22 DIAGNOSIS — Z1211 Encounter for screening for malignant neoplasm of colon: Secondary | ICD-10-CM

## 2016-06-22 MED ORDER — NA SULFATE-K SULFATE-MG SULF 17.5-3.13-1.6 GM/177ML PO SOLN: ORAL | 0 refills | Status: DC

## 2016-06-22 NOTE — Progress Notes (Signed)
No egg or soy allergy  No diet medications taken  No hx of intubation problems; does have nausea post op  No home oxygen used of hx of sleep apnea  $15 off Suprep coupon given  Erika from [employer] here to interpret for pt

## 2016-06-23 ENCOUNTER — Encounter: Payer: Self-pay | Admitting: Gastroenterology

## 2016-07-03 ENCOUNTER — Ambulatory Visit (AMBULATORY_SURGERY_CENTER): Payer: BLUE CROSS/BLUE SHIELD | Admitting: Gastroenterology

## 2016-07-03 ENCOUNTER — Encounter: Payer: Self-pay | Admitting: Gastroenterology

## 2016-07-03 VITALS — BP 111/64 | HR 64 | Temp 98.7°F | Resp 12

## 2016-07-03 DIAGNOSIS — Z1211 Encounter for screening for malignant neoplasm of colon: Secondary | ICD-10-CM | POA: Diagnosis present

## 2016-07-03 DIAGNOSIS — D128 Benign neoplasm of rectum: Secondary | ICD-10-CM

## 2016-07-03 DIAGNOSIS — K621 Rectal polyp: Secondary | ICD-10-CM

## 2016-07-03 DIAGNOSIS — Z1212 Encounter for screening for malignant neoplasm of rectum: Secondary | ICD-10-CM

## 2016-07-03 MED ORDER — SODIUM CHLORIDE 0.9 % IV SOLN: 500.0000 mL | INTRAVENOUS | Status: DC

## 2016-07-03 NOTE — Progress Notes (Signed)
A and O x3. Report to RN. Tolerated MAC anesthesia well. 

## 2016-07-03 NOTE — Patient Instructions (Signed)
Impression/Recommendations:  Polyp handout given to patient. Repeat colonoscopy in 5-10 years for surveillance.  YOU HAD AN ENDOSCOPIC PROCEDURE TODAY AT [facility]:   Refer to the procedure report that was given to you for any specific questions about what was found during the examination.  If the procedure report does not answer your questions, please call your gastroenterologist to clarify.  If you requested that your care partner not be given the details of your procedure findings, then the procedure report has been included in a sealed envelope for you to review at your convenience later.  YOU SHOULD EXPECT: Some feelings of bloating in the abdomen. Passage of more gas than usual.  Walking can help get rid of the air that was put into your GI tract during the procedure and reduce the bloating. If you had a lower endoscopy (such as a colonoscopy or flexible sigmoidoscopy) you may notice spotting of blood in your stool or on the toilet paper. If you underwent a bowel prep for your procedure, you may not have a normal bowel movement for a few days.  Please Note:  You might notice some irritation and congestion in your nose or some drainage.  This is from the oxygen used during your procedure.  There is no need for concern and it should clear up in a day or so.  SYMPTOMS TO REPORT IMMEDIATELY:   Following lower endoscopy (colonoscopy or flexible sigmoidoscopy):  Excessive amounts of blood in the stool  Significant tenderness or worsening of abdominal pains  Swelling of the abdomen that is new, acute  Fever of 100F or higher For urgent or emergent issues, a gastroenterologist can be reached at any hour by calling [phone].   DIET:  We do recommend a small meal at first, but then you may proceed to your regular diet.  Drink plenty of fluids but you should avoid alcoholic beverages for 24 hours.  ACTIVITY:  You should plan to take it easy for the rest of today and you  should NOT DRIVE or use heavy machinery until tomorrow (because of the sedation medicines used during the test).    FOLLOW UP: Our staff will call the number listed on your records the next business day following your procedure to check on you and address any questions or concerns that you may have regarding the information given to you following your procedure. If we do not reach you, we will leave a message.  However, if you are feeling well and you are not experiencing any problems, there is no need to return our call.  We will assume that you have returned to your regular daily activities without incident.  If any biopsies were taken you will be contacted by phone or by letter within the next 1-3 weeks.  Please call us at [phone] if you have not heard about the biopsies in 3 weeks.    SIGNATURES/CONFIDENTIALITY: You and/or your care partner have signed paperwork which will be entered into your electronic medical record.  These signatures attest to the fact that that the information above on your After Visit Summary has been reviewed and is understood.  Full responsibility of the confidentiality of this discharge information lies with you and/or your care-partner.

## 2016-07-03 NOTE — Op Note (Signed)
[facility] Patient Name: Taylor Parker Procedure Date: 07/03/2016 2:36 PM MRN: [id number] Endoscopist: Mauri Pole , MD Age: 50 Referring MD:  Date of Birth: 04-07-1966 Gender: Female Account #: [id number] Procedure:                Colonoscopy Indications:              Screening for colorectal malignant neoplasm Medicines:                Monitored Anesthesia Care Procedure:                Pre-Anesthesia Assessment:                           - Prior to the procedure, a History and Physical                            was performed, and patient medications and                            allergies were reviewed. The patient's tolerance of                            previous anesthesia was also reviewed. The risks                            and benefits of the procedure and the sedation                            options and risks were discussed with the patient.                            All questions were answered, and informed consent                            was obtained. Prior Anticoagulants: The patient has                            taken no previous anticoagulant or antiplatelet                            agents. ASA Grade Assessment: II - A patient with                            mild systemic disease. After reviewing the risks                            and benefits, the patient was deemed in                            satisfactory condition to undergo the procedure.                           After obtaining informed consent, the colonoscope  was passed under direct vision. Throughout the                            procedure, the patient's blood pressure, pulse, and                            oxygen saturations were monitored continuously. The                            Model PCF-H190DL [id number]) scope was introduced                            through the anus and advanced to the the cecum,   identified by appendiceal orifice and ileocecal                            valve. The colonoscopy was performed without                            difficulty. The patient tolerated the procedure                            well. The quality of the bowel preparation was                            good. The ileocecal valve, appendiceal orifice, and                            rectum were photographed. Scope In: 2:41:41 PM Scope Out: 2:57:49 PM Scope Withdrawal Time: 0 hours 11 minutes 38 seconds  Total Procedure Duration: 0 hours 16 minutes 8 seconds  Findings:                 The perianal and digital rectal examinations were                            normal.                           A 3 mm polyp was found in the rectum. The polyp was                            sessile. The polyp was removed with a cold biopsy                            forceps. Resection and retrieval were complete.                           The exam was otherwise without abnormality. Complications:            No immediate complications. Estimated Blood Loss:     Estimated blood loss was minimal. Impression:               - One 3 mm polyp in the rectum, removed with a cold  biopsy forceps. Resected and retrieved.                           - The examination was otherwise normal. Recommendation:           - Patient has a contact number available for                            emergencies. The signs and symptoms of potential                            delayed complications were discussed with the                            patient. Return to normal activities tomorrow.                            Written discharge instructions were provided to the                            patient.                           - Resume previous diet.                           - Continue present medications.                           - Await pathology results.                           - Repeat colonoscopy in 5-10  years for surveillance                            based on pathology results. Mauri Pole, MD 07/03/2016 3:06:18 PM This report has been signed electronically.

## 2016-07-03 NOTE — Progress Notes (Signed)
Called to room to assist during endoscopic procedure.  Patient ID and intended procedure confirmed with present staff. Received instructions for my participation in the procedure from the performing physician.  

## 2016-07-07 ENCOUNTER — Telehealth: Payer: Self-pay | Admitting: *Deleted

## 2016-07-07 NOTE — Telephone Encounter (Signed)
  Follow up Call-  Call back number 07/03/2016  Post procedure Call Back phone  # [phone]  Permission to leave phone message Yes  Some recent data might be hidden    [facility]

## 2016-07-09 ENCOUNTER — Encounter: Payer: Self-pay | Admitting: Gastroenterology

## 2016-09-02 ENCOUNTER — Encounter: Payer: Self-pay | Admitting: Gynecology

## 2016-09-02 ENCOUNTER — Ambulatory Visit (INDEPENDENT_AMBULATORY_CARE_PROVIDER_SITE_OTHER): Payer: BLUE CROSS/BLUE SHIELD | Admitting: Gynecology

## 2016-09-02 VITALS — BP 118/80 | Ht 62.0 in | Wt 162.0 lb

## 2016-09-02 DIAGNOSIS — D171 Benign lipomatous neoplasm of skin and subcutaneous tissue of trunk: Secondary | ICD-10-CM

## 2016-09-02 DIAGNOSIS — L292 Pruritus vulvae: Secondary | ICD-10-CM | POA: Diagnosis not present

## 2016-09-02 DIAGNOSIS — B3731 Acute candidiasis of vulva and vagina: Secondary | ICD-10-CM

## 2016-09-02 DIAGNOSIS — N760 Acute vaginitis: Secondary | ICD-10-CM

## 2016-09-02 DIAGNOSIS — L723 Sebaceous cyst: Secondary | ICD-10-CM | POA: Diagnosis not present

## 2016-09-02 DIAGNOSIS — B9689 Other specified bacterial agents as the cause of diseases classified elsewhere: Secondary | ICD-10-CM

## 2016-09-02 DIAGNOSIS — B373 Candidiasis of vulva and vagina: Secondary | ICD-10-CM

## 2016-09-02 LAB — WET PREP FOR TRICH, YEAST, CLUE
Clue Cells Wet Prep HPF POC: NONE SEEN
Trich, Wet Prep: NONE SEEN

## 2016-09-02 MED ORDER — METRONIDAZOLE 500 MG PO TABS: 500.0000 mg | ORAL_TABLET | Freq: Two times a day (BID) | ORAL | 0 refills | Status: DC

## 2016-09-02 MED ORDER — FLUCONAZOLE 150 MG PO TABS: 150.0000 mg | ORAL_TABLET | Freq: Once | ORAL | 0 refills | Status: AC

## 2016-09-02 NOTE — Progress Notes (Signed)
   Patient's a 51 year old that presented to the office today complaining of vaginal irritation and pruritus no true discharge. She also was concerned about a small lump she noticed on the anterior abdominal wall as well as on her labia majora. Patient sexually active infrequent in a monogamous relationship. She denied any difficulty urinating or any GI complaints.  Exam: Abdomen small low abdominal anterior wall lipoma Pelvic: Bartholin urethra Skene was within normal limits Superior portion of the left labia majora a small sebaceous cyst was noted Speculum exam clear discharge was noted no other gross lesions were noted Cervix: No lesions or discharge  Wet prep moderate yeast, moderate bacteria and few white blood cells.  Assessment/plan: Patient with yeast vaginitis and possible component of bacterial vaginosis will be treated with Diflucan 150 mg one by mouth today and begin taking Flagyl 500 mg twice a day for 7 days.

## 2016-09-02 NOTE — Patient Instructions (Addendum)
Quiste epidrmico (Epidermal Cyst) Un quiste epidrmico es un bulto pequeo, no doloroso, que se encuentra debajo de la piel. Tambin se lo puede llamar quiste de inclusin epidrmica o quiste infundibular. El quiste contiene una sustancia blanca griscea con mal olor [place]). Es importante que no apriete los quistes epidrmicos. Estos quistes por lo general no son dainos (son benignos), pero se pueden infectar. Entre los sntomas de infeccin, se incluyen los siguientes:  Enrojecimiento.  Inflamacin.  Dolor a Secretary/administrator.  [place].  Cristy Hilts.  Una sustancia blanca griscea, con mal olor, que sale del [place].  Pus que sale del quiste. [place] los medicamentos de venta libre y los recetados solamente como se lo haya indicado el mdico.  Si le recetaron un antibitico, tmelo como se lo haya indicado el mdico. No deje de usar el antibitico aunque comience a Sports administrator.  Mantenga el rea que rodea el quiste limpia y [place].  Use ropa suelta y seca.  No intente apretar el quiste.  Evite tocar el quiste.  Revise el [employer] para detectar signos de infeccin.  Concurra a todas las visitas de control como se lo haya indicado el mdico. Esto es importante. PREVENCIN  Use ropa limpia y [place].  Evite usar ropa [place].  Mantenga la piel limpia y seca. Tome una ducha o un bao [employer].  Lvese el cuerpo con perxido de benzolo cuando tome una ducha o un bao. SOLICITE AYUDA SI:  El quiste presenta sntomas de infeccin.  El problema no mejora, o empeora.  Tiene un quiste con un aspecto diferente de los otros quistes que ha tenido.  Tiene fiebre. SOLICITE AYUDA DE INMEDIATO SI:  Aparece enrojecimiento en el quiste que se propaga hacia el rea que lo rodea. Esta informacin no tiene Marine scientist el consejo del mdico. Asegrese de hacerle al mdico cualquier pregunta que tenga. Document Released: 06/29/2005 Document  Revised: 12/29/2011 Document Reviewed: 05/01/2015 Elsevier Interactive Patient Education  2017 Elsevier Inc. Lipoma (Lipoma) Un lipoma es un tumor no canceroso (benigno) formado por clulas de grasa. Es un tipo muy frecuente de [employer] tejidos blandos. Por lo general, los lipomas se encuentran debajo de la piel (subcutneos). Pueden aparecer en cualquier tejido del cuerpo que contenga grasa. Las [employer] los lipomas aparecen con mayor frecuencia incluyen la espalda, los hombros, las nalgas y los muslos. Los lipomas crecen lentamente y, en general, son indoloros. La mayora de los lipomas no causan problemas y no requieren Clinical research associate. CAUSAS Se desconoce la causa de esta afeccin. FACTORES DE RIESGO Es ms probable que esta afeccin se manifieste en:  Personas de 40a 60aos.  Personas con antecedentes familiares de lipomas. SNTOMAS Por lo general, el lipoma aparece como un pequeo bulto redondo debajo de la piel. Puede ser Bea Laura o gomoso al tacto, y la firmeza puede variar. La mayora de los lipomas no son dolorosos. Sin embargo, el lipoma puede doler si se encuentra en un rea en la que hace presin [employer] nervios. DIAGNSTICO Por lo general, el lipoma puede diagnosticarse con un examen fsico. Tambin pueden hacerle estudios para confirmar el diagnstico y Paramedic otras enfermedades. Entre ellos:  Pruebas de diagnstico por imgenes, como resonancia magntica o tomografa computada.  Extraccin de [place] de tejido para analizar con un microscopio (biopsia). TRATAMIENTO Los lipomas pequeos que no causan problemas no requieren tratamiento. Si el lipoma contina creciendo o causa problemas, la extirpacin suele ser la mejor  opcin. Los lipomas tambin pueden extirparse para mejorar el aspecto. Por lo general, esto consiste en una ciruga en la que se extirpan las clulas de grasa y la cpsula que las rodea. Para este procedimiento, se utiliza con frecuencia un  medicamento que adormece el rea (anestesia local). INSTRUCCIONES PARA EL CUIDADO EN EL HOGAR  Concurra a todas las visitas de control como se lo haya indicado el mdico. Esto es importante. SOLICITE ATENCIN MDICA SI:  El lipoma se agranda o se endurece.  El lipoma comienza a causarle dolor, se enrojece o se hincha cada vez ms. Estos podran ser signos de infeccin o de una afeccin ms grave. Esta informacin no tiene Marine scientist el consejo del mdico. Asegrese de hacerle al mdico cualquier pregunta que tenga. Document Released: 04/08/2005 Document Revised: 11/13/2014 Document Reviewed: 06/25/2014 Candidiasis vaginal en los adultos (Gastrointestinal Yeast Infection, Adult) La candidiasis vaginal es una afeccin que causa dolor, hinchazn y enrojecimiento (inflamacin) de la vagina. Tambin causa secrecin vaginal. Esta es una enfermedad frecuente. Algunas mujeres contraen esta infeccin con frecuencia. CAUSAS La causa de la infeccin es un cambio en el equilibrio normal de los hongos (cndida) y las bacterias que viven en la vagina. Esta alteracin deriva en el crecimiento excesivo de los hongos, lo que causa la inflamacin. FACTORES DE RIESGO Es ms probable que esta afeccin se manifieste en:  Las mujeres que toman antibiticos.  Las mujeres que tienen diabetes.  Las mujeres que toman anticonceptivos.  Las mujeres que estn embarazadas.  Las mujeres que se hacen duchas vaginales con frecuencia.  Las mujeres que tienen un sistema de defensa (inmunitario) dbil.  Las mujeres que han tomado corticoides durante mucho tiempo.  Las mujeres que usan ropa ajustada con frecuencia. SNTOMAS Los sntomas de esta afeccin incluyen lo siguiente:  Secrecin vaginal blanca y espesa.  Hinchazn, picazn, enrojecimiento e irritacin de la vagina. Los labios de la vagina (vulva) tambin se pueden infectar.  Dolor o ardor al Garment/textile technologist.  [place]. DIAGNSTICO Esta afeccin se diagnostica mediante la historia clnica y un examen fsico. Este incluye un examen plvico. El mdico examinar una muestra de la secrecin vaginal con un microscopio. Probablemente el mdico enve esta muestra al laboratorio para analizarla y confirmar el diagnstico. TRATAMIENTO Esta afeccin se trata con medicamentos. Los [employer] pueden ser recetados o de venta libre. Podrn indicarle que use uno o ms de lo siguiente:  Medicamentos por va oral.  Medicamentos que se aplican como una crema.  Medicamentos que se colocan directamente en la vagina (vulos vaginales). [place] o aplquese los medicamentos de venta libre y Editor, commissioning como se lo haya indicado el mdico.  No tenga relaciones sexuales hasta que el mdico lo autorice. Comunique a su compaero sexual que tiene una infeccin por hongos. Esas personas deben consultar al mdico si tienen sntomas.  No use ropa ajustada, como pantis o pantalones ajustados.  Evite el uso de tampones hasta que el mdico lo autorice.  Consuma ms yogur. Esto puede ayudar a Technical brewer de la candidiasis.  Intente darse un bao de asiento para [employer]. Se trata de un bao de agua tibia que se toma mientras se est sentado. El agua solo debe Systems analyst las caderas y cubrir las nalgas. Hgalo 3o 4veces al da o como se lo haya indicado el mdico.  No se haga duchas vaginales.  Use ropa interior transpirable de algodn.  Si tiene  diabetes, mantenga bajo control los niveles de Dispensing optician. SOLICITE ATENCIN MDICA SI:  Jaclynn Guarneri.  Los sntomas desaparecen y Teacher, adult education.  Los sntomas no mejoran con Dispensing optician.  Los sntomas empeoran.  Aparecen nuevos sntomas.  Aparecen ampollas alrededor o adentro de la vagina.  Le sale sangre de la vagina y no est menstruando.  Siente dolor en el abdomen. Esta  informacin no tiene Marine scientist el consejo del mdico. Asegrese de hacerle al mdico cualquier pregunta que tenga. Document Released: 04/08/2005 Document Revised: 10/21/2015 Document Reviewed: 12/31/2014 Elsevier Interactive Patient Education  2017 Elsie bacteriana (Bacterial Vaginosis) La vaginosis bacteriana es una infeccin de la vagina. Se produce cuando crece una cantidad excesiva de grmenes normales (bacterias sanas) en la vagina. Esta infeccin aumenta el riesgo de contraer otras infecciones de transmisin sexual. El tratamiento de esta infeccin puede ayudar a reducir el riesgo de otras infecciones, como:  Clamidia.  Roderick Pee.  VIH.  Herpes. [place] los medicamentos tal como se lo indic su mdico.  Finalice la prescripcin completa, aunque comience a sentirse mejor.  Comunique a sus compaeros sexuales que sufre una infeccin. Deben consultar a su mdico para iniciar un tratamiento.  Durante el tratamiento:  Teacher, music o use preservativos de Cabin crew.  No se haga duchas vaginales.  No consuma alcohol a menos que el mdico lo autorice.  No amamante a menos que el mdico la autorice. SOLICITE AYUDA SI:  No mejora luego de 3 das de tratamiento.  Observa una secrecin (prdida) de color gris ms abundante que proviene de la vagina.  Siente ms dolor que antes.  Tiene fiebre. ASEGRESE DE QUE:  Comprende estas instrucciones.  Controlar su afeccin.  Recibir ayuda de inmediato si no mejora o si empeora. Esta informacin no tiene Marine scientist el consejo del mdico. Asegrese de hacerle al mdico cualquier pregunta que tenga. Document Released: 09/25/2008 Document Revised: 10/21/2015 Document Reviewed: 02/08/2013 Elsevier Interactive Patient Education  2017 [employer]. Chartered certified accountant Patient Education  [employer].

## 2016-10-02 ENCOUNTER — Other Ambulatory Visit: Payer: Self-pay | Admitting: Urology

## 2016-10-02 DIAGNOSIS — N361 Urethral diverticulum: Secondary | ICD-10-CM

## 2016-10-05 ENCOUNTER — Emergency Department (HOSPITAL_COMMUNITY): Payer: BLUE CROSS/BLUE SHIELD

## 2016-10-05 ENCOUNTER — Ambulatory Visit (INDEPENDENT_AMBULATORY_CARE_PROVIDER_SITE_OTHER): Payer: BLUE CROSS/BLUE SHIELD | Admitting: Gynecology

## 2016-10-05 ENCOUNTER — Ambulatory Visit (INDEPENDENT_AMBULATORY_CARE_PROVIDER_SITE_OTHER): Payer: BLUE CROSS/BLUE SHIELD

## 2016-10-05 ENCOUNTER — Emergency Department (HOSPITAL_COMMUNITY)
Admission: EM | Admit: 2016-10-05 | Discharge: 2016-10-05 | Disposition: A | Discharge status: Home / Self Care | Payer: BLUE CROSS/BLUE SHIELD | Attending: Emergency Medicine | Admitting: Emergency Medicine

## 2016-10-05 ENCOUNTER — Other Ambulatory Visit: Payer: Self-pay | Admitting: Gynecology

## 2016-10-05 ENCOUNTER — Encounter (HOSPITAL_COMMUNITY): Payer: Self-pay | Admitting: Emergency Medicine

## 2016-10-05 ENCOUNTER — Encounter: Payer: Self-pay | Admitting: Gynecology

## 2016-10-05 VITALS — BP 124/76

## 2016-10-05 DIAGNOSIS — N12 Tubulo-interstitial nephritis, not specified as acute or chronic: Secondary | ICD-10-CM | POA: Insufficient documentation

## 2016-10-05 DIAGNOSIS — R1031 Right lower quadrant pain: Secondary | ICD-10-CM | POA: Insufficient documentation

## 2016-10-05 DIAGNOSIS — R350 Frequency of micturition: Secondary | ICD-10-CM

## 2016-10-05 LAB — URINALYSIS W MICROSCOPIC + REFLEX CULTURE
Bilirubin Urine: NEGATIVE
Casts: NONE SEEN [LPF]
Crystals: NONE SEEN [HPF]
Glucose, UA: NEGATIVE
Ketones, ur: NEGATIVE
Nitrite: POSITIVE — AB
Specific Gravity, Urine: 1.025 (ref 1.001–1.035)
Yeast: NONE SEEN [HPF]
pH: 6 (ref 5.0–8.0)

## 2016-10-05 LAB — COMPREHENSIVE METABOLIC PANEL
ALT: 14 U/L (ref 14–54)
AST: 14 U/L — ABNORMAL LOW (ref 15–41)
Albumin: 3.6 g/dL (ref 3.5–5.0)
Alkaline Phosphatase: 67 U/L (ref 38–126)
Anion gap: 7 (ref 5–15)
BUN: 9 mg/dL (ref 6–20)
CO2: 23 mmol/L (ref 22–32)
Calcium: 8.5 mg/dL — ABNORMAL LOW (ref 8.9–10.3)
Chloride: 108 mmol/L (ref 101–111)
Creatinine, Ser: 0.73 mg/dL (ref 0.44–1.00)
GFR calc Af Amer: >60 mL/min (ref >60)
GFR calc non Af Amer: >60 mL/min (ref >60)
Glucose, Bld: 100 mg/dL — ABNORMAL HIGH (ref 65–99)
Potassium: 3.7 mmol/L (ref 3.5–5.1)
Sodium: 138 mmol/L (ref 135–145)
Total Bilirubin: 0.8 mg/dL (ref 0.3–1.2)
Total Protein: 6.8 g/dL (ref 6.5–8.1)

## 2016-10-05 LAB — URINALYSIS, ROUTINE W REFLEX MICROSCOPIC
Bilirubin Urine: NEGATIVE
Glucose, UA: NEGATIVE mg/dL
Ketones, ur: NEGATIVE mg/dL
Nitrite: NEGATIVE
Protein, ur: 100 mg/dL — AB
Specific Gravity, Urine: 1.013 (ref 1.005–1.030)
pH: 6 (ref 5.0–8.0)

## 2016-10-05 LAB — CBC
HCT: 39.3 % (ref 36.0–46.0)
Hemoglobin: 13.3 g/dL (ref 12.0–15.0)
MCH: 30.6 pg (ref 26.0–34.0)
MCHC: 33.8 g/dL (ref 30.0–36.0)
MCV: 90.3 fL (ref 78.0–100.0)
Platelets: 229 10*3/uL (ref 150–400)
RBC: 4.35 MIL/uL (ref 3.87–5.11)
RDW: 13.7 % (ref 11.5–15.5)
WBC: 9.1 10*3/uL (ref 4.0–10.5)

## 2016-10-05 LAB — LIPASE, BLOOD: Lipase: 23 U/L (ref 11–51)

## 2016-10-05 LAB — I-STAT BETA HCG BLOOD, ED (MC, WL, AP ONLY): I-stat hCG, quantitative: <5 m[IU]/mL (ref <5)

## 2016-10-05 MED ORDER — HYDROCODONE-ACETAMINOPHEN 5-325 MG PO TABS: 2.0000 | ORAL_TABLET | ORAL | 0 refills | Status: DC | PRN

## 2016-10-05 MED ORDER — ONDANSETRON 4 MG PO TBDP: 4.0000 mg | ORAL_TABLET | Freq: Three times a day (TID) | ORAL | 0 refills | Status: DC | PRN

## 2016-10-05 MED ORDER — MORPHINE SULFATE (PF) 4 MG/ML IV SOLN
4.0000 mg | INTRAVENOUS | Status: DC | PRN
  Administered 2016-10-05 (×2): 4 mg via INTRAVENOUS
  Filled 2016-10-05 (×2): qty 1

## 2016-10-05 MED ORDER — ONDANSETRON HCL 4 MG/2ML IJ SOLN
4.0000 mg | Freq: Once | INTRAMUSCULAR | Status: AC
Start: 2016-10-05 — End: 2016-10-05
  Administered 2016-10-05: 4 mg via INTRAVENOUS
  Filled 2016-10-05: qty 2

## 2016-10-05 MED ORDER — IOPAMIDOL (ISOVUE-300) INJECTION 61%
100.0000 mL | Freq: Once | INTRAVENOUS | Status: AC | PRN
  Administered 2016-10-05: 100 mL via INTRAVENOUS

## 2016-10-05 MED ORDER — CEFTRIAXONE SODIUM 2 G IJ SOLR
2.0000 g | Freq: Once | INTRAMUSCULAR | Status: AC
  Administered 2016-10-05: 2 g via INTRAVENOUS
  Filled 2016-10-05: qty 2

## 2016-10-05 MED ORDER — CIPROFLOXACIN HCL 500 MG PO TABS: 500.0000 mg | ORAL_TABLET | Freq: Two times a day (BID) | ORAL | 0 refills | Status: DC

## 2016-10-05 MED ORDER — IOPAMIDOL (ISOVUE-300) INJECTION 61%
INTRAVENOUS | Status: DC
Start: 2016-10-05 — End: 2016-10-06
  Filled 2016-10-05: qty 100

## 2016-10-05 NOTE — ED Provider Notes (Signed)
[facility] DEPT Provider Note   CSN: [id number] Arrival date & time: 10/05/16  1718     History   Chief Complaint Chief Complaint  Patient presents with  . Abdominal Pain    HPI Taylor Parker is a 51 y.o. female.Chief complaint is abdominal pain.  HPI:  Patient presents for evaluation of right lower quadrant pain. Prior to her arrival I received a call from her OB/GYN describing right lower quadrant pain.  An ultrasound at the office that showed "something dilated in the right lower quadrant". Their concern was for appendicitis.  Pt describes RLQ pain for several days. Fever today. No nausea or vomiting. No vaginal bleeding or discharge. No dysuria but some frequency.  Past Medical History:  Diagnosis Date  . Anxiety   . Depression    no med  . Headache(784.0)    otc med prn  . Hyperlipidemia    diet controlled, no meds  . PMDD (premenstrual dysphoric disorder)   . PONV (postoperative nausea and vomiting)    in the past    Patient Active Problem List   Diagnosis Date Noted  . Abdominal pain, acute, right lower quadrant 10/05/2016  . Personal history of DVT (deep vein thrombosis) 05/19/2016  . Migraine headache 11/19/2015  . PCP NOTES >>>> 05/24/2015  . Depression 05/28/2014  . Varicose veins with pain 04/25/2014  . Chronic venous insufficiency 04/25/2014  . Endometrial polyp 03/26/2014  . Intermenstrual bleeding 03/26/2014  . History of breast abscess 02/19/2014  . Chronic mastitis of left breast 12/21/2013  . Urinary incontinence 05/08/2013  . Hyperlipidemia 09/03/2011  . KNEE PAIN, RIGHT, CHRONIC 06/05/2008  . DEPRESSION 04/28/2006  . DVT 04/28/2006  . GALACTORRHEA 04/28/2006  . DECREASED LIBIDO 04/28/2006    Past Surgical History:  Procedure Laterality Date  . BREAST BIOPSY  06/19/2011   Procedure: BREAST BIOPSY;  Surgeon: Rolm Bookbinder, MD;  Location: [facility];  Service: General;  Laterality: Right;  Right breast  chronic abscess drainage and debridement  . BREAST DUCTAL SYSTEM EXCISION Left 01/24/2014   Procedure:  LEFT BREAST DUCT EXCISION;  Surgeon: Rolm Bookbinder, MD;  Location: [facility];  Service: General;  Laterality: Left;  . BREAST SURGERY  8/12   right breast biopsy  . CESAREAN SECTION     X 3  . DILATATION & CURETTAGE/HYSTEROSCOPY WITH MYOSURE N/A 05/10/2014   Procedure: DILATATION & CURETTAGE/HYSTEROSCOPY WITH MYOSURE ABLATION,RESECTOSCOPIC POLYPECTOMY;  Surgeon: Terrance Mass, MD;  Location: [facility];  Service: Gynecology;  Laterality: N/A;  Confirmed with [employer] rep to be present.  . INCISE AND DRAIN ABCESS     right breast  . TUBAL LIGATION  2004    OB History    Gravida Para Term Preterm AB Living   3 3 3     3    SAB TAB Ectopic Multiple Live Births           3       Home Medications    Prior to Admission medications   Medication Sig Start Date End Date Taking? Authorizing Provider  mirabegron ER (MYRBETRIQ) 50 MG TB24 tablet Take 50 mg by mouth daily.   Yes Historical Provider, MD  ciprofloxacin (CIPRO) 500 MG tablet Take 1 tablet (500 mg total) by mouth every 12 (twelve) hours. 10/05/16   Tanna Furry, MD  HYDROcodone-acetaminophen (NORCO/VICODIN) 5-325 MG tablet Take 2 tablets by mouth every 4 (four) hours as needed. 10/05/16   Tanna Furry, MD  ondansetron (ZOFRAN ODT) 4 MG disintegrating tablet  Take 1 tablet (4 mg total) by mouth every 8 (eight) hours as needed for nausea. 10/05/16   Tanna Furry, MD    Family History Family History  Problem Relation Age of Onset  . Heart disease Mother   . Diabetes Other     gm  . High Cholesterol Brother     high TG  . Colon cancer Neg Hx   . Breast cancer Neg Hx   . Esophageal cancer Neg Hx   . Stomach cancer Neg Hx   . Rectal cancer Neg Hx     Social History Social History  Substance Use Topics  . Smoking status: Never Smoker  . Smokeless tobacco: Never Used  . Alcohol use 0.0 oz/week     Comment: very seldom       Allergies   Patient has no known allergies.   Review of Systems Review of Systems  Constitutional: Positive for fever. Negative for appetite change, chills, diaphoresis and fatigue.  HENT: Negative for mouth sores, sore throat and trouble swallowing.   Eyes: Negative for visual disturbance.  Respiratory: Negative for cough, chest tightness, shortness of breath and wheezing.   Cardiovascular: Negative for chest pain.  Gastrointestinal: Positive for abdominal pain and nausea. Negative for abdominal distention, diarrhea and vomiting.  Endocrine: Negative for polydipsia, polyphagia and polyuria.  Genitourinary: Positive for frequency. Negative for dysuria and hematuria.  Musculoskeletal: Negative for gait problem.  Skin: Negative for color change, pallor and rash.  Neurological: Negative for dizziness, syncope, light-headedness and headaches.  Hematological: Does not bruise/bleed easily.  Psychiatric/Behavioral: Negative for behavioral problems and confusion.     Physical Exam Updated Vital Signs BP (!) 108/53 (BP Location: Right Arm)   Pulse (!) 57   Temp 98.3 F (36.8 C) (Oral)   Resp 18   Ht 5\' 2"  (1.575 m)   Wt 155 lb (70.3 kg)   LMP 10/03/2016   SpO2 97%   BMI 28.35 kg/m   Physical Exam  Constitutional: She is oriented to person, place, and time. She appears well-developed and well-nourished. No distress.  HENT:  Head: Normocephalic.  Eyes: Conjunctivae are normal. Pupils are equal, round, and reactive to light. No scleral icterus.  Neck: Normal range of motion. Neck supple. No thyromegaly present.  Cardiovascular: Normal rate and regular rhythm.  Exam reveals no gallop and no friction rub.   No murmur heard. Pulmonary/Chest: Effort normal and breath sounds normal. No respiratory distress. She has no wheezes. She has no rales.  Abdominal: Soft. Bowel sounds are normal. She exhibits no distension. There is no tenderness. There is no rebound.  There is no right  lower quadrant. No frank rebound tenderness. No rigidity.  Musculoskeletal: Normal range of motion.  Neurological: She is alert and oriented to person, place, and time.  Skin: Skin is warm and dry. No rash noted.  Psychiatric: She has a normal mood and affect. Her behavior is normal.     ED Treatments / Results  Labs (all labs ordered are listed, but only abnormal results are displayed) Labs Reviewed  COMPREHENSIVE METABOLIC PANEL - Abnormal; Notable for the following:       Result Value   Glucose, Bld 100 (*)    Calcium 8.5 (*)    AST 14 (*)    All other components within normal limits  URINALYSIS, ROUTINE W REFLEX MICROSCOPIC - Abnormal; Notable for the following:    APPearance CLOUDY (*)    Hgb urine dipstick LARGE (*)    Protein, ur  100 (*)    Leukocytes, UA LARGE (*)    Bacteria, UA MANY (*)    Squamous Epithelial / LPF 0-5 (*)    Non Squamous Epithelial 0-5 (*)    All other components within normal limits  URINE CULTURE  LIPASE, BLOOD  CBC  I-STAT BETA HCG BLOOD, ED ([facility], [facility], [facility] ONLY)    EKG  EKG Interpretation None       Radiology US Transvaginal Non-ob  Result Date: 10/05/2016 Ultrasound: Uterus measured 9.3 x 7.7 x 4.6 cm with endometrial stripe of 3.5 mm. Some fluid in endometrium since she's on day 3 of her menstrual cycle. Right ovary was normal with normal arterial blood flow. Left ovary was normal with arterial blood flow noted no fluid in the cul-de-sac. Right adnexa cystic focus 25 x 9 mm negative color flow with adjacent solid calcified area 5 mm.  Ct Abdomen Pelvis W Contrast  Result Date: 10/05/2016 CLINICAL DATA:  Right lower quadrant abdominal pain. Chills starting last night. EXAM: CT ABDOMEN AND PELVIS WITH CONTRAST TECHNIQUE: Multidetector CT imaging of the abdomen and pelvis was performed using the standard protocol following bolus administration of intravenous contrast. CONTRAST:  161mL ISOVUE-300 IOPAMIDOL (ISOVUE-300) INJECTION 61% COMPARISON:   03/02/2005 FINDINGS: Lower chest:  No acute finding. Hepatobiliary: Small scattered subcentimeter low densities in the liver are benign-appearing and chronic compared to 2006.No evidence of biliary obstruction or stone. Pancreas: Unremarkable. Spleen: Unremarkable. Adrenals/Urinary Tract: Negative adrenals. Diffuse asymmetric urothelial enhancement and thickening involving the right ureter. Mild intermittent right hydroureter without visible obstructive process. No evidence of pyelonephritis. No hydronephrosis. Unremarkable bladder. Stomach/Bowel:  No obstruction. No appendicitis. Vascular/Lymphatic: No acute vascular abnormality. No mass or adenopathy. Reproductive:No pathologic findings. Other: No ascites or pneumoperitoneum. Musculoskeletal: No acute abnormalities. Deformity of the pelvic ring, likely posttraumatic, with left sacroiliac ankylosis. IMPRESSION: 1. Urothelial thickening involving the right ureter, likely infectious ureteritis. No evidence of pyelonephritis. No hydronephrosis. 2. Normal appendix. Electronically Signed   By: Monte Fantasia M.D.   On: 10/05/2016 21:24    Procedures Procedures (including critical care time)  Medications Ordered in ED Medications  morphine 4 MG/ML injection 4 mg (4 mg Intravenous Given 10/05/16 2143)  iopamidol (ISOVUE-300) 61 % injection (not administered)  ondansetron (ZOFRAN) injection 4 mg (4 mg Intravenous Given 10/05/16 1957)  iopamidol (ISOVUE-300) 61 % injection 100 mL (100 mLs Intravenous Contrast Given 10/05/16 2100)  cefTRIAXone (ROCEPHIN) 2 g in dextrose 5 % 50 mL IVPB (0 g Intravenous Stopped 10/05/16 2241)     Initial Impression / Assessment and Plan / ED Course  I have reviewed the triage vital signs and the nursing notes.  Pertinent labs & imaging results that were available during my care of the patient were reviewed by me and considered in my medical decision making (see chart for details).     Urine shows multiple white blood  cells. She has more tenderness of expected for simple cystitis or pyelonephritis. CT scan obtained and does show dilatation and inflammation with uptake of contrast throughout the course of the ureter described as"Ureteritis" per radiology report. No hydronephrosis or sign of obstruction. This would fit clinically with her symptoms. Given IV Rocephin. Discharged with Cipro. Vicodin. Push fluids. Primary care follow-up.  Final Clinical Impressions(s) / ED Diagnoses   Final diagnoses:  Pyelonephritis    New Prescriptions Discharge Medication List as of 10/05/2016 10:10 PM    START taking these medications   Details  ciprofloxacin (CIPRO) 500 MG tablet Take 1 tablet (  500 mg total) by mouth every 12 (twelve) hours., Starting Mon 10/05/2016, Print    HYDROcodone-acetaminophen (NORCO/VICODIN) 5-325 MG tablet Take 2 tablets by mouth every 4 (four) hours as needed., Starting Mon 10/05/2016, Print    ondansetron (ZOFRAN ODT) 4 MG disintegrating tablet Take 1 tablet (4 mg total) by mouth every 8 (eight) hours as needed for nausea., Starting Mon 10/05/2016, Print         Tanna Furry, MD 10/05/16 2356

## 2016-10-05 NOTE — Progress Notes (Signed)
   Patient is a 51 year old that presented to the office stating that last night and she will workup with pain in the right lower abdomen. She denies any nausea or vomiting or any fever chills. Some frequency and slight dysuria. Her last menstrual cycle started 3 days ago. She recently has seen the urologist and was treating her for overactive bladder and she could not tolerate the Myrbetriq  because it gave her a headache and she discontinued it.  Exam: Back: No CVA tenderness Abdomen: Soft tender the right lower quadrant. Positive obturator sign plus minus Rovsing sign negative heel tap sign. Pelvic: Bartholin urethra Skene was within normal limits Vagina: Menstrual blood present Cervix: Menstrual blood present Uterus: Anteverted normal size shape and consistency Left adnexa nontender Right adnexa tenderness Rectal exam not done  Urinalysis: May red blood cells many bacteria moderate mucus, culture submitted  Ultrasound: Uterus measured 9.3 x 7.7 x 4.6 cm with endometrial stripe of 3.5 mm. Some fluid in endometrium since she's on day 3 of her menstrual cycle. Right ovary was normal with normal arterial blood flow. Left ovary was normal with arterial blood flow noted no fluid in the cul-de-sac. Right adnexa cystic focus 25 x 9 mm negative color flow with adjacent solid calcified area 5 mm.  Assessment/plan: Patient with acute onset of right lower abdominal pain woke her up early this evening. Pain score today 5/10. Positive Rovsing sign positive obturator sign we'll send to the emergency room for possible spiral CT scan rule out appendicitis as well as for blood work. Normal-appearing ovaries on ultrasound with  small hydrosalpinx with a calcified area noted on ultrasound. Do not feel symptoms related to GYN. Also possibility of underlying kidney stone? Patient currently under treatment by urologist for overactive bladder.

## 2016-10-05 NOTE — Discharge Instructions (Signed)
Push fluids. Vicoden for pain Cipro as prescribed. Stop cipro with a ANY joint pain, and follow up with your Doctor.

## 2016-10-05 NOTE — ED Triage Notes (Signed)
Patient is complaining of RLQ abdominal pain and chills starting last night. Denies nausea, vomiting, diarrhea.

## 2016-10-06 ENCOUNTER — Telehealth: Payer: Self-pay | Admitting: Internal Medicine

## 2016-10-06 NOTE — Telephone Encounter (Signed)
Was seen at the ER with a UTI and they requested a PCP follow-up. Please arrange

## 2016-10-07 NOTE — Telephone Encounter (Signed)
[name]-can you call Pt to set-up ED follow-up please? Thank you.

## 2016-10-07 NOTE — Telephone Encounter (Signed)
Patient scheduled for Fri 10/16/2016 at 11:30am.

## 2016-10-08 LAB — URINE CULTURE: Culture: >=100000 — AB

## 2016-10-09 ENCOUNTER — Telehealth: Payer: Self-pay

## 2016-10-09 NOTE — Telephone Encounter (Signed)
Post ED Visit - Positive Culture Follow-up  Culture report reviewed by antimicrobial stewardship pharmacist:  []  Elenor Quinones, Pharm.D. []  Heide Guile, Pharm.D., BCPS AQ-ID []  Parks Neptune, Pharm.D., BCPS []  Alycia Rossetti, Pharm.D., BCPS []  [place], Pharm.D., BCPS, AAHIVP []  Legrand Como, Pharm.D., BCPS, AAHIVP []  Salome Arnt, PharmD, BCPS []  Dimitri Ped, PharmD, BCPS []  Vincenza Hews, PharmD, BCPS Joaquim Lai Pharm D Positive urine culture Treated with Ciprofloxacin, organism sensitive to the same and no further patient follow-up is required at this time.  Genia Del 10/09/2016, 10:50 AM

## 2016-10-16 ENCOUNTER — Ambulatory Visit (INDEPENDENT_AMBULATORY_CARE_PROVIDER_SITE_OTHER): Payer: BLUE CROSS/BLUE SHIELD | Admitting: Internal Medicine

## 2016-10-16 ENCOUNTER — Encounter: Payer: Self-pay | Admitting: Internal Medicine

## 2016-10-16 VITALS — BP 122/66 | HR 61 | Temp 98.0°F | Resp 14 | Ht 62.0 in | Wt 159.4 lb

## 2016-10-16 DIAGNOSIS — M2550 Pain in unspecified joint: Secondary | ICD-10-CM | POA: Diagnosis not present

## 2016-10-16 DIAGNOSIS — N390 Urinary tract infection, site not specified: Secondary | ICD-10-CM | POA: Diagnosis not present

## 2016-10-16 LAB — BASIC METABOLIC PANEL
BUN: 15 mg/dL (ref 6–23)
CO2: 26 mEq/L (ref 19–32)
Calcium: 9.1 mg/dL (ref 8.4–10.5)
Chloride: 107 mEq/L (ref 96–112)
Creatinine, Ser: 0.66 mg/dL (ref 0.40–1.20)
GFR: 100.42 mL/min (ref >60.00)
Glucose, Bld: 100 mg/dL — ABNORMAL HIGH (ref 70–99)
Potassium: 3.9 mEq/L (ref 3.5–5.1)
Sodium: 139 mEq/L (ref 135–145)

## 2016-10-16 NOTE — Progress Notes (Signed)
Pre visit review using our clinic review tool, if applicable. No additional management support is needed unless otherwise documented below in the visit note. 

## 2016-10-16 NOTE — Progress Notes (Signed)
Subjective:    Patient ID: Taylor Parker, female    DOB: 02-25-1966, 51 y.o.   MRN: [id number]  DOS:  10/16/2016 Type of visit - description : ER follow-up Interval history: Went to the ER 10/05/2016. Had right lower quadrant abdominal pain, after she saw her gynecologist she was referred the ER for possible appendicitis. She had fever. ER eval including a normal lipase, normal CBC, calcium was a slightly low, pregnancy test negative, urinalysis consistent with infection, urine culture eventually came back with Escherichia coli. CT with no appendicitis, see report. She got Rocephin, sent home with Cipro, Vicodin.  Also, likes to discuss pain. For the last 2 weeks has noted consistent pain at the hands, wrist, feet and knees. She has not seen any swelling, redness or warmness in her joints. She does have some mild stiffness in the morning.   Review of Systems  since he left the hospital she finished her antibiotic. No fever chills. No nausea or vomiting. No further abdominal pain. No gross hematuria. Interestingly, she has developed mild dysuria.   Past Medical History:  Diagnosis Date  . Anxiety   . Depression    no med  . Headache(784.0)    otc med prn  . Hyperlipidemia    diet controlled, no meds  . PMDD (premenstrual dysphoric disorder)   . PONV (postoperative nausea and vomiting)    in the past    Past Surgical History:  Procedure Laterality Date  . BREAST BIOPSY  06/19/2011   Procedure: BREAST BIOPSY;  Surgeon: Rolm Bookbinder, MD;  Location: [facility];  Service: General;  Laterality: Right;  Right breast chronic abscess drainage and debridement  . BREAST DUCTAL SYSTEM EXCISION Left 01/24/2014   Procedure:  LEFT BREAST DUCT EXCISION;  Surgeon: Rolm Bookbinder, MD;  Location: [facility];  Service: General;  Laterality: Left;  . BREAST SURGERY  8/12   right breast biopsy  . CESAREAN SECTION     X 3  . DILATATION & CURETTAGE/HYSTEROSCOPY WITH  MYOSURE N/A 05/10/2014   Procedure: DILATATION & CURETTAGE/HYSTEROSCOPY WITH MYOSURE ABLATION,RESECTOSCOPIC POLYPECTOMY;  Surgeon: Terrance Mass, MD;  Location: [facility];  Service: Gynecology;  Laterality: N/A;  Confirmed with [employer] rep to be present.  . INCISE AND DRAIN ABCESS     right breast  . TUBAL LIGATION  2004    Social History   Social History  . Marital status: Married    Spouse name: N/A  . Number of children: 3  . Years of education: N/A   Occupational History  . [employer]  7pm-4 am    Social History Main Topics  . Smoking status: Never Smoker  . Smokeless tobacco: Never Used  . Alcohol use 0.0 oz/week     Comment: very seldom   . Drug use: No  . Sexual activity: Yes    Birth control/ protection: Surgical     Comment: BTL   Other Topics Concern  . Not on file   Social History Narrative   Original from [place]             Allergies as of 10/16/2016   No Known Allergies     Medication List       Accurate as of 10/16/16 11:59 PM. Always use your most recent med list.          HYDROcodone-acetaminophen 5-325 MG tablet Commonly known as:  NORCO/VICODIN Take 2 tablets by mouth every 4 (four) hours as needed.   MYRBETRIQ 50 MG  Tb24 tablet Generic drug:  mirabegron ER Take 50 mg by mouth daily.   ondansetron 4 MG disintegrating tablet Commonly known as:  ZOFRAN ODT Take 1 tablet (4 mg total) by mouth every 8 (eight) hours as needed for nausea.          Objective:   Physical Exam BP 122/66 (BP Location: Left Arm, Patient Position: Sitting, Cuff Size: Small)   Pulse 61   Temp 98 F (36.7 C) (Oral)   Resp 14   Ht 5\' 2"  (1.575 m)   Wt 159 lb 6 oz (72.3 kg)   LMP 10/03/2016   SpO2 97%   BMI 29.15 kg/m  General:   Well developed, well nourished . NAD.  HEENT:  Normocephalic . Face symmetric, atraumatic Lungs:  CTA B Normal respiratory effort, no intercostal retractions, no accessory muscle use. Heart: RRR,  no murmur.  no pretibial  edema bilaterally  Abdomen:  Not distended, soft, non-tender. No rebound or rigidity.  Skin: Not pale. Not jaundice MSK: Hands and wrists without synovitis Neurologic:  alert & oriented X3.  Speech normal, gait appropriate for age and unassisted Psych--  Cognition and judgment appear intact.  Cooperative with normal attention span and concentration.  Behavior appropriate. No anxious or depressed appearing.    Assessment & Plan:  Assessment > Dyslipidemia, high triglycerides Depression - sx controlled,sees another provider Headache: CT 11/2015 negative BTL  PLAN: UTI: Recovering from pyelonephritis, status post Cipro. She has mild dysuria, recommend to come back in 2 weeks if not completely back to normal, will check a UA and urine culture. Interestingly, she saw urology prior to the ER visit, she was prescribed Myrbetriq. hypocalcemia: recheck labs  Arthralgias: As described above, symmetric arthralgias without evidence of synovitis. Recent CBC with no anemia. Will check ANA rheumatoid factor. Asked to come back in 3 months. RTC 3 months

## 2016-10-16 NOTE — Patient Instructions (Signed)
GO TO THE LAB : Get the blood work     GO TO THE FRONT DESK Schedule your next appointment for a  Check up in 3 months    If you still have  burning when you urinate, come back to the office in 2 weeks , just for a urine test, you don't need to  see me.

## 2016-10-17 LAB — CALCIUM, IONIZED: Calcium, Ion: 5 mg/dL (ref 4.8–5.6)

## 2016-10-17 NOTE — Assessment & Plan Note (Addendum)
UTI: Recovering from pyelonephritis, status post Cipro. She has mild dysuria, recommend to come back in 2 weeks if not completely back to normal, will check a UA and urine culture. Interestingly, she saw urology prior to the ER visit, she was prescribed Myrbetriq. hypocalcemia: recheck labs  Arthralgias: As described above, symmetric arthralgias without evidence of synovitis. Recent CBC with no anemia. Will check ANA rheumatoid factor. Asked to come back in 3 months. RTC 3 months

## 2016-10-19 LAB — ANA: Anti Nuclear Antibody(ANA): POSITIVE — AB

## 2016-10-19 LAB — RHEUMATOID FACTOR: Rhuematoid fact SerPl-aCnc: <14 IU/mL (ref <14)

## 2016-10-19 LAB — ANTI-NUCLEAR AB-TITER (ANA TITER): ANA Titer 1: 1:320 {titer} — ABNORMAL HIGH

## 2016-11-06 ENCOUNTER — Ambulatory Visit (HOSPITAL_COMMUNITY)
Admission: RE | Admit: 2016-11-06 | Discharge: 2016-11-06 | Disposition: A | Discharge status: Home / Self Care | Payer: BLUE CROSS/BLUE SHIELD | Source: Ambulatory Visit | Attending: Urology | Admitting: Urology

## 2016-11-06 DIAGNOSIS — N361 Urethral diverticulum: Secondary | ICD-10-CM | POA: Diagnosis present

## 2016-11-06 MED ORDER — GADOBENATE DIMEGLUMINE 529 MG/ML IV SOLN
15.0000 mL | Freq: Once | INTRAVENOUS | Status: AC | PRN
  Administered 2016-11-06: 15 mL via INTRAVENOUS

## 2016-11-25 ENCOUNTER — Encounter: Payer: Self-pay | Admitting: Gynecology

## 2016-12-09 HISTORY — PX: BLADDER SURGERY: SHX569

## 2017-01-15 ENCOUNTER — Ambulatory Visit (HOSPITAL_BASED_OUTPATIENT_CLINIC_OR_DEPARTMENT_OTHER)
Admission: RE | Admit: 2017-01-15 | Discharge: 2017-01-15 | Disposition: A | Discharge status: Home / Self Care | Payer: BLUE CROSS/BLUE SHIELD | Source: Ambulatory Visit | Attending: Internal Medicine | Admitting: Internal Medicine

## 2017-01-15 ENCOUNTER — Ambulatory Visit (INDEPENDENT_AMBULATORY_CARE_PROVIDER_SITE_OTHER): Payer: BLUE CROSS/BLUE SHIELD | Admitting: Internal Medicine

## 2017-01-15 ENCOUNTER — Encounter: Payer: Self-pay | Admitting: Internal Medicine

## 2017-01-15 VITALS — BP 116/74 | HR 58 | Temp 98.1°F | Resp 14 | Ht 62.0 in | Wt 160.1 lb

## 2017-01-15 DIAGNOSIS — M7989 Other specified soft tissue disorders: Secondary | ICD-10-CM | POA: Insufficient documentation

## 2017-01-15 DIAGNOSIS — G245 Blepharospasm: Secondary | ICD-10-CM

## 2017-01-15 DIAGNOSIS — Z86718 Personal history of other venous thrombosis and embolism: Secondary | ICD-10-CM | POA: Insufficient documentation

## 2017-01-15 DIAGNOSIS — I8390 Asymptomatic varicose veins of unspecified lower extremity: Secondary | ICD-10-CM

## 2017-01-15 DIAGNOSIS — M2550 Pain in unspecified joint: Secondary | ICD-10-CM

## 2017-01-15 DIAGNOSIS — R768 Other specified abnormal immunological findings in serum: Secondary | ICD-10-CM

## 2017-01-15 NOTE — Progress Notes (Signed)
Pre visit review using our clinic review tool, if applicable. No additional management support is needed unless otherwise documented below in the visit note. 

## 2017-01-15 NOTE — Progress Notes (Signed)
Subjective:    Patient ID: Taylor Parker, female    DOB: [birth year], 51 y.o.   MRN: [id number]  DOS:  01/15/2017 Type of visit - description : Follow-up Interval history:  Since the last visit, had a bladder procedure, currently with no GU or UTI symptoms. Was seen with arthralgias, currently no pain at the wrist, knees but reports  Pain at fingers (w/o swelling). ANA was positive  Also, 3 weeks history of left blepharospasm, first episode, symptoms are persistent. Also complained of chronic lower extremity edema, worse on the left, related to standing at work, decreased with rest. Has not use the prescribed compression stockings   Review of Systems Denies fever chills No chest pain, difficulty breathing or palpitations. No facial numbness or diplopia..  Past Medical History:  Diagnosis Date  . Anxiety   . Depression    no med  . Headache(784.0)    otc med prn  . Hyperlipidemia    diet controlled, no meds  . PMDD (premenstrual dysphoric disorder)   . PONV (postoperative nausea and vomiting)    in the past    Past Surgical History:  Procedure Laterality Date  . BLADDER SURGERY  5-30-218  . BREAST BIOPSY  06/19/2011   Procedure: BREAST BIOPSY;  Surgeon: Rolm Bookbinder, MD;  Location: [facility];  Service: General;  Laterality: Right;  Right breast chronic abscess drainage and debridement  . BREAST DUCTAL SYSTEM EXCISION Left 01/24/2014   Procedure:  LEFT BREAST DUCT EXCISION;  Surgeon: Rolm Bookbinder, MD;  Location: [facility];  Service: General;  Laterality: Left;  . BREAST SURGERY  8/12   right breast biopsy  . CESAREAN SECTION     X 3  . DILATATION & CURETTAGE/HYSTEROSCOPY WITH MYOSURE N/A 05/10/2014   Procedure: DILATATION & CURETTAGE/HYSTEROSCOPY WITH MYOSURE ABLATION,RESECTOSCOPIC POLYPECTOMY;  Surgeon: Terrance Mass, MD;  Location: [facility];  Service: Gynecology;  Laterality: N/A;  Confirmed with [employer] rep to be present.  . INCISE AND  DRAIN ABCESS     right breast  . TUBAL LIGATION  2004    Social History   Social History  . Marital status: Married    Spouse name: N/A  . Number of children: 3  . Years of education: N/A   Occupational History  . [employer]  7pm-4 am    Social History Main Topics  . Smoking status: Never Smoker  . Smokeless tobacco: Never Used  . Alcohol use 0.0 oz/week     Comment: very seldom   . Drug use: No  . Sexual activity: Yes    Birth control/ protection: Surgical     Comment: BTL   Other Topics Concern  . Not on file   Social History Narrative   Original from [place]             Allergies as of 01/15/2017   No Known Allergies     Medication List    as of 01/15/2017 11:59 PM   You have not been prescribed any medications.        Objective:   Physical Exam  Musculoskeletal:       Legs:  BP 116/74 (BP Location: Left Arm, Patient Position: Sitting, Cuff Size: Small)   Pulse (!) 58   Temp 98.1 F (36.7 C) (Oral)   Resp 14   Ht 5\' 2"  (1.575 m)   Wt 160 lb 2 oz (72.6 kg)   LMP 01/09/2017 (Approximate)   SpO2 97%   BMI 29.29 kg/m  General:   Well developed, well nourished . NAD.  HEENT:  Normocephalic . Face symmetric, atraumatic. Lower left eyelid with fine tremor.. EOMI, pupils equal and reactive Lungs:  CTA B Normal respiratory effort, no intercostal retractions, no accessory muscle use. Heart: RRR,  no murmur.  Lower extremities: No pitting edema anywhere. + Varicose veins, no redness or warmness.  L measured: calves symmetric but slightly distal from the left calf there is some puffiness and TTP. See graphic MSK: Hands and wrists without synovitis Skin: Not pale. Not jaundice Neurologic:  alert & oriented X3.  Speech normal, gait appropriate for age and unassisted Psych--  Cognition and judgment appear intact.  Cooperative with normal attention span and concentration.  Behavior appropriate. No anxious or depressed appearing.      Assessment &  Plan:   Assessment   Dyslipidemia, high triglycerides Depression - sx controlled,sees another provider Headache: CT 11/2015 negative H/o DVT, s/p coumadin 2003 during pregnancy  Varicose Veins: pt reports ablation L ~ 2014  BTL  PLAN: UTI: Currently asx Arthralgias: Most of arthralgias resolved except for some pain at the fingers without redness or warmness. ANA was positive. Will recheck that ANA, sedimentation rate and CRP. Lower extremity edema: On and off for years, extensive chart review, she had a DVT in 2003 during pregnancy, had ablation and the left leg about 4 years ago, previous ultrasounds neg for  DVT. On today's exam there is some tenderness at the posterior left leg, she is high risk for DVT. Will get ultrasound. Otherwise rec strict use of compression stockings, patient desires another intervention, refer to interventional radiology.   Blepharospasm: new issue, refer to neurology. RTC 5 months, CPX   Today, I spent more than  25  min with the patient: >50% of the time counseling regards treatment options for blepharospasm, pros and cons of doing a ultrasound of the lower extremity. Extensive chart review.

## 2017-01-15 NOTE — Patient Instructions (Addendum)
GO TO THE LAB : Get the blood work     GO TO THE FRONT DESK Schedule your next appointment for a  physical exam in 5 months, fasting   Go downstairs and get the ultrasound of the leg.

## 2017-01-16 LAB — SEDIMENTATION RATE: Sed Rate: 12 mm/hr (ref 0–30)

## 2017-01-16 NOTE — Assessment & Plan Note (Signed)
UTI: Currently asx Arthralgias: Most of arthralgias resolved except for some pain at the fingers without redness or warmness. ANA was positive. Will recheck that ANA, sedimentation rate and CRP. Lower extremity edema: On and off for years, extensive chart review, she had a DVT in 2003 during pregnancy, had ablation and the left leg about 4 years ago, previous ultrasounds neg for  DVT. On today's exam there is some tenderness at the posterior left leg, she is high risk for DVT. Will get ultrasound. Otherwise rec strict use of compression stockings, patient desires another intervention, refer to interventional radiology.   Blepharospasm: new issue, refer to neurology. RTC 5 months, CPX

## 2017-01-18 ENCOUNTER — Other Ambulatory Visit: Payer: Self-pay | Admitting: Internal Medicine

## 2017-01-18 ENCOUNTER — Other Ambulatory Visit: Payer: Self-pay | Admitting: Gynecology

## 2017-01-18 DIAGNOSIS — N6002 Solitary cyst of left breast: Secondary | ICD-10-CM

## 2017-01-18 DIAGNOSIS — I8390 Asymptomatic varicose veins of unspecified lower extremity: Secondary | ICD-10-CM

## 2017-01-18 LAB — ANTI-NUCLEAR AB-TITER (ANA TITER): ANA Titer 1: 1:640 {titer} — ABNORMAL HIGH

## 2017-01-18 LAB — HIGH SENSITIVITY CRP: CRP, High Sensitivity: 4.9 mg/L — ABNORMAL HIGH

## 2017-01-18 LAB — ANA: Anti Nuclear Antibody(ANA): POSITIVE — AB

## 2017-01-19 NOTE — Addendum Note (Signed)
Addended byDamita Dunnings D on: 01/19/2017 05:06 PM   Modules accepted: Orders

## 2017-01-21 ENCOUNTER — Ambulatory Visit (INDEPENDENT_AMBULATORY_CARE_PROVIDER_SITE_OTHER): Payer: BLUE CROSS/BLUE SHIELD | Admitting: Neurology

## 2017-01-21 ENCOUNTER — Encounter: Payer: Self-pay | Admitting: Neurology

## 2017-01-21 VITALS — BP 105/67 | HR 59 | Ht 62.0 in | Wt 161.5 lb

## 2017-01-21 DIAGNOSIS — R253 Fasciculation: Secondary | ICD-10-CM

## 2017-01-21 NOTE — Progress Notes (Signed)
Provider:  Larey Seat, M D  Referring Provider: Colon Branch, MD Primary Care Physician:  Taylor Branch, MD  Chief Complaint  Patient presents with  . New Patient (Initial Visit)    left eye shaking    HPI:  Taylor Parker is a 51 y.o. female  Is seen here as a referral/ revisit  from Taylor Parker for blepharospasm evaluation.  Mrs. Taylor Parker reports that a month ago she started noticing twitching in her lower left eyelid, a frequent fasciculation that I can see now. This fasciculation affects the lower eyelid and is best visible towards the inner corner. It does not lead to eyelid closure or spasms. There is no facial grimacing. The rest of her face feels normal and is under normal motor control. The patient reports that she did neither start new medication a month ago, and she did not drink large amounts of caffeine. These are the 2 main reasons for facial fasciculations.  She works 7 PM to 4 AM at [employer]. She is fatigued. She has noted that his fatigue and increased level of sleepiness the fasciculations reach a higher amplitude.    Review of Systems: Out of a complete 14 system review, the patient complains of only the following symptoms, and all other reviewed systems are negative.   Social History   Social History  . Marital status: Married    Spouse name: N/A  . Number of children: 3  . Years of education: N/A   Occupational History  . [employer]  7pm-4 am    Social History Main Topics  . Smoking status: Never Smoker  . Smokeless tobacco: Never Used  . Alcohol use 0.0 oz/week     Comment: very seldom   . Drug use: No  . Sexual activity: Yes    Birth control/ protection: Surgical     Comment: BTL   Other Topics Concern  . Not on file   Social History Narrative   Original from [place]           Family History  Problem Relation Age of Onset  . Heart disease Mother   . Diabetes Other        gm  . High Cholesterol Brother        high TG    . Taylor cancer Neg Hx   . Breast cancer Neg Hx   . Esophageal cancer Neg Hx   . Stomach cancer Neg Hx   . Rectal cancer Neg Hx     Past Medical History:  Diagnosis Date  . Anxiety   . Depression    no med  . Headache(784.0)    otc med prn  . Hyperlipidemia    diet controlled, no meds  . PMDD (premenstrual dysphoric disorder)   . PONV (postoperative nausea and vomiting)    in the past    Past Surgical History:  Procedure Laterality Date  . BLADDER SURGERY  5-30-218  . BREAST BIOPSY  06/19/2011   Procedure: BREAST BIOPSY;  Surgeon: Rolm Bookbinder, MD;  Location: [facility];  Service: General;  Laterality: Right;  Right breast chronic abscess drainage and debridement  . BREAST DUCTAL SYSTEM EXCISION Left 01/24/2014   Procedure:  LEFT BREAST DUCT EXCISION;  Surgeon: Rolm Bookbinder, MD;  Location: [facility];  Service: General;  Laterality: Left;  . BREAST SURGERY  8/12   right breast biopsy  . CESAREAN SECTION     X 3  . DILATATION & CURETTAGE/HYSTEROSCOPY  WITH MYOSURE N/A 05/10/2014   Procedure: DILATATION & CURETTAGE/HYSTEROSCOPY WITH MYOSURE ABLATION,RESECTOSCOPIC POLYPECTOMY;  Surgeon: Terrance Mass, MD;  Location: [facility];  Service: Gynecology;  Laterality: N/A;  Confirmed with [employer] rep to be present.  . INCISE AND DRAIN ABCESS     right breast  . TUBAL LIGATION  2004    No current outpatient prescriptions on file.   No current facility-administered medications for this visit.     Allergies as of 01/21/2017  . (No Known Allergies)   Anti-nuclear ab-titer (ANA titer)  Order: [id number] - Reflex for Order [id number]  Status:  Final result Visible to patient:  No (Not Released) Next appt:  01/22/2017 at 03:30 PM in Radiology ([facility])  Notes recorded by Damita Dunnings, CMA on 10/22/2016 at 7:45 AM EDT Letter printed and mailed to Pt. ------  Notes recorded by Taylor Branch, MD on 10/21/2016 at 5:51 PM EDT Send a letter Taylor Parker, your  calcium came back normal. I also check a special test to see about arthritis, one of them came back slightly positive. If you have severe joint pains, fever, or rash please let me know otherwise will reassess the situation in 3 months. SU CALCIO ESTA A NIVELES NORMALES. TAMBIEN CHEQUEAMOS UNA PRUEBAS DE ARTRITIS, UNA DE Taylor Parker. SI TIENE DOLORES ARTICULARES SEVEROS, FIEBRE O UN RASH POR FAVOR LLAMENOS A LA OFICINA. DE LO CONTRARIO, VEREMOS COMO SIGUE CUANDO REGRESE EN 3 MESES  Newer results are available. Click to view them now.   Ref Range & Units 70mo ago  ANA Pattern 1  NUCLEOLAR    ANA Titer 1 titer 1:320    Comment:     Reference Range       < 1:40   Negative        1:40-1:80 Low Antibody level       > 1:80   Elevated Antibody level   Resulting Agency  [facility]  Narrative           Vitals: BP 105/67   Pulse (!) 59   Ht 5\' 2"  (1.575 m)   Wt 161 lb 8 oz (73.3 kg)   LMP 01/09/2017 (Approximate)   BMI 29.54 kg/m  Last Weight:  Wt Readings from Last 1 Encounters:  01/21/17 161 lb 8 oz (73.3 kg)   Last Height:   Ht Readings from Last 1 Encounters:  01/21/17 5\' 2"  (1.575 m)    Physical exam:  General: The patient is awake, alert and appears not in acute distress. The patient is well groomed. Head: Normocephalic, atraumatic. Neck is supple. Cardiovascular:  Regular rate and rhythm , without  murmurs or carotid bruit, and without distended neck veins. Respiratory: Lungs are clear to auscultation. Skin:  Without evidence of edema, or rash Trunk: normal posture.  Neurologic exam : The patient is awake and alert, oriented to place and time.  Memory subjective described as intact.  There is a normal attention span & concentration ability. Speech is fluent without  dysarthria, dysphonia or aphasia. This is confirmed with her translator, Taylor Parker.  Mood and affect are appropriate.  Cranial nerves:  No taste or smell  alteration. Pupils are equal and briskly reactive to light. Funduscopic exam without  evidence of pallor or edema.  Extraocular movements  in vertical and horizontal planes intact and without nystagmus.  Visual fields by finger perimetry are intact. Hearing to finger rub intact.  Facial sensation intact to fine touch. Facial motor strength is  symmetric and tongue and uvula move midline. Tongue protrusion into either cheek is normal. Shoulder shrug is normal.   Motor exam: Normal tone ,muscle bulk and symmetric  strength in all extremities.  Sensory:  Fine touch, pinprick and vibration were normal  Coordination: Rapid alternating movements  were normal.  Gait and station: Patient walks without assistive device  Deep tendon reflexes: in the  upper and lower extremities are symmetric and intact.   Assessment:  After physical and neurologic examination, review of laboratory studies, imaging, neurophysiology testing and pre-existing records, assessment is that of : lower eyelid fasciculation, This is not a tic disorder, and it is not a complete blepharospasm either.  I assured Mrs. Taylor Parker at the most common reason for this finding as anxiety, fatigue and stress.  She does not have sicca symptoms.  No facial rash.   Many patients experience an increase in fasciculations after caffeine intake or using albuterol inhalers for asthma. None of this is the case for her today. This is a benign condition and does not warrant a medication intervention given the possible side effects. 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 I think it would help her to get a couple of days rest.  PS : In addition she was very cold sensitive today and I would like for her primary care physician to re-check her thyroid function.   Plan:  Treatment plan and additional workup :   Get Rest !  Avoid caffeine.  See me if your symptoms progress or you need additional work up   [employer] MD 01/21/2017

## 2017-01-22 ENCOUNTER — Ambulatory Visit
Admission: RE | Admit: 2017-01-22 | Discharge: 2017-01-22 | Disposition: A | Discharge status: Home / Self Care | Payer: BLUE CROSS/BLUE SHIELD | Source: Ambulatory Visit | Attending: Gynecology | Admitting: Gynecology

## 2017-01-22 DIAGNOSIS — N6002 Solitary cyst of left breast: Secondary | ICD-10-CM

## 2017-02-02 ENCOUNTER — Ambulatory Visit
Admission: RE | Admit: 2017-02-02 | Discharge: 2017-02-02 | Disposition: A | Discharge status: Home / Self Care | Payer: BLUE CROSS/BLUE SHIELD | Source: Ambulatory Visit | Attending: Internal Medicine | Admitting: Internal Medicine

## 2017-02-02 DIAGNOSIS — I8390 Asymptomatic varicose veins of unspecified lower extremity: Secondary | ICD-10-CM

## 2017-02-02 NOTE — Consult Note (Signed)
Chief Complaint: Patient was seen in consultation today for left lower extremity pain at the request of Paz,Jose E  Referring Physician(s): Paz,Jose E  History of Present Illness: Taylor Parker is a 51 y.o. female with a history of left lower extremity superficial venous insufficiency.  Approximately 5 years ago she was diagnosed with insufficiency of the left GSV and she underwent percutaneous ablative therapy at another center.  She had relief of her left leg pain, heaviness, swelling and aching for several years.   Unfortunately, over the past year or so she has begun to develop recurrent symptoms along the posterior aspect of the calf.  She has bulging varicose veins which become painful when standing.  She also describes heaviness and swelling worse at the end of the day.   She has worn compression hose in the past but not since her last treatment at the outside center. She denies RLE symptoms, chest pain or shortness of breath.   Past Medical History:  Diagnosis Date  . Anxiety   . Depression    no med  . Headache(784.0)    otc med prn  . Hyperlipidemia    diet controlled, no meds  . PMDD (premenstrual dysphoric disorder)   . PONV (postoperative nausea and vomiting)    in the past    Past Surgical History:  Procedure Laterality Date  . BLADDER SURGERY  5-30-218  . BREAST BIOPSY  06/19/2011   Procedure: BREAST BIOPSY;  Surgeon: Rolm Bookbinder, MD;  Location: [facility];  Service: General;  Laterality: Right;  Right breast chronic abscess drainage and debridement  . BREAST DUCTAL SYSTEM EXCISION Left 01/24/2014   Procedure:  LEFT BREAST DUCT EXCISION;  Surgeon: Rolm Bookbinder, MD;  Location: [place];  Service: General;  Laterality: Left;  . BREAST SURGERY  8/12   right breast biopsy  . CESAREAN SECTION     X 3  . DILATATION & CURETTAGE/HYSTEROSCOPY WITH MYOSURE N/A 05/10/2014   Procedure: DILATATION & CURETTAGE/HYSTEROSCOPY WITH MYOSURE  ABLATION,RESECTOSCOPIC POLYPECTOMY;  Surgeon: Terrance Mass, MD;  Location: [facility];  Service: Gynecology;  Laterality: N/A;  Confirmed with [employer] rep to be present.  . INCISE AND DRAIN ABCESS     right breast  . TUBAL LIGATION  2004    Allergies: Patient has no known allergies.  Medications: Prior to Admission medications   Not on File     Family History  Problem Relation Age of Onset  . Heart disease Mother   . Diabetes Other        gm  . High Cholesterol Brother        high TG  . Colon cancer Neg Hx   . Breast cancer Neg Hx   . Esophageal cancer Neg Hx   . Stomach cancer Neg Hx   . Rectal cancer Neg Hx     Social History   Social History  . Marital status: Married    Spouse name: N/A  . Number of children: 3  . Years of education: N/A   Occupational History  . [employer]  7pm-4 am    Social History Main Topics  . Smoking status: Never Smoker  . Smokeless tobacco: Never Used  . Alcohol use 0.0 oz/week     Comment: very seldom   . Drug use: No  . Sexual activity: Yes    Birth control/ protection: Surgical     Comment: BTL   Other Topics Concern  . Not on file  Social History Narrative   Original from [place]           Review of Systems: A 12 point ROS discussed and pertinent positives are indicated in the HPI above.  All other systems are negative.  Review of Systems  Vital Signs: BP (!) 96/44   Pulse 64   Temp 98 F (36.7 C) (Oral)   Resp 14   Ht 5\' 2"  (1.575 m)   Wt 161 lb (73 kg)   LMP 01/09/2017 (Approximate)   SpO2 99%   BMI 29.45 kg/m   Physical Exam  Constitutional: She is oriented to person, place, and time. She appears well-developed and well-nourished. No distress.  HENT:  Head: Normocephalic and atraumatic.  Eyes: No scleral icterus.  Cardiovascular: Normal rate and regular rhythm.   Pulmonary/Chest: Effort normal.  Abdominal: Soft. She exhibits no distension. There is no tenderness.  Musculoskeletal:        Legs: Neurological: She is alert and oriented to person, place, and time.  Skin: Skin is warm and dry.  Psychiatric: She has a normal mood and affect. Her behavior is normal.  Nursing note and vitals reviewed.   Imaging: US Venous Img Lower Unilateral Left  Result Date: 01/15/2017 CLINICAL DATA:  Left calf pain and swelling for 4 weeks, worsening. History of prior DVT and varicose veins. EXAM: Left LOWER EXTREMITY VENOUS DOPPLER ULTRASOUND TECHNIQUE: Gray-scale sonography with graded compression, as well as color Doppler and duplex ultrasound were performed to evaluate the lower extremity deep venous systems from the level of the common femoral vein and including the common femoral, femoral, profunda femoral, popliteal and calf veins including the posterior tibial, peroneal and gastrocnemius veins when visible. The superficial great saphenous vein was also interrogated. Spectral Doppler was utilized to evaluate flow at rest and with distal augmentation maneuvers in the common femoral, femoral and popliteal veins. COMPARISON:  None. FINDINGS: Contralateral Common Femoral Vein: Respiratory phasicity is normal and symmetric with the symptomatic side. No evidence of thrombus. Normal compressibility. Common Femoral Vein: No evidence of thrombus. Normal compressibility, respiratory phasicity and response to augmentation. Saphenofemoral Junction: No evidence of thrombus. Normal compressibility and flow on color Doppler imaging. Profunda Femoral Vein: No evidence of thrombus. Normal compressibility and flow on color Doppler imaging. Femoral Vein: No evidence of thrombus. Normal compressibility, respiratory phasicity and response to augmentation. Popliteal Vein: No evidence of thrombus. Normal compressibility, respiratory phasicity and response to augmentation. Calf Veins: No evidence of thrombus. Normal compressibility and flow on color Doppler imaging. Superficial Great Saphenous Vein: Not well visualized past the  upper thigh, query ablation. Venous Reflux:  None. Other Findings:  None. IMPRESSION: 1. No evidence of DVT within the left lower extremity. 2. The superficial greater saphenous vein is not well visualized past the level of the upper thigh, significance uncertain. The patient does have a history of venous ablation. Electronically Signed   By: Van Clines M.D.   On: 01/15/2017 17:55   [place] Rad Eval And Mgmt  Result Date: 02/02/2017 Please refer to "Notes" to see consult details.  US Breast Ltd Uni Left Inc Axilla  Result Date: 01/22/2017 CLINICAL DATA:  Short-term followup for probable small cysts in the left breast. These were evaluated in November 2017. EXAM: 2D DIGITAL DIAGNOSTIC LEFT MAMMOGRAM WITH CAD AND ADJUNCT TOMO ULTRASOUND LEFT BREAST COMPARISON:  Previous exam(s). ACR Breast Density Category c: The breast tissue is heterogeneously dense, which may obscure small masses. FINDINGS: The focal opacity seen medially in the left breast  on the renal study dated 05/26/2016 is less apparent, and appears as fibroglandular tissue on 3D imaging. There are no discrete mammographic masses, no areas architectural distortion no suspicious calcifications. Mammographic images were processed with CAD. Targeted ultrasound is performed, showing several small upper outer quadrant left breast cysts. The largest lies in the 10 o'clock position, 3 cm the nipple measuring 6 x 5 x 5 mm. Just peripheral to this at 10 o'clock, 4 cm the nipple, there are 2 small cysts, 1 measuring 6 x 3 x 5 mm in the other 3 mm. There are no solid masses or suspicious lesions. IMPRESSION: 1. No evidence of malignancy. 2. Small benign left breast cysts. No additional short-term follow-up is indicated. RECOMMENDATION: 1. Screening mammogram in November 2018 per normal screening schedule.(Code:SM-B-01Y) I have discussed the findings and recommendations with the patient. Results were also provided in writing at the conclusion of the visit. If  applicable, a reminder letter will be sent to the patient regarding the next appointment. BI-RADS CATEGORY  2: Benign. Electronically Signed   By: Lajean Manes M.D.   On: 01/22/2017 15:54   Mm Diag Breast Tomo Uni Left  Result Date: 01/22/2017 CLINICAL DATA:  Short-term followup for probable small cysts in the left breast. These were evaluated in November 2017. EXAM: 2D DIGITAL DIAGNOSTIC LEFT MAMMOGRAM WITH CAD AND ADJUNCT TOMO ULTRASOUND LEFT BREAST COMPARISON:  Previous exam(s). ACR Breast Density Category c: The breast tissue is heterogeneously dense, which may obscure small masses. FINDINGS: The focal opacity seen medially in the left breast on the renal study dated 05/26/2016 is less apparent, and appears as fibroglandular tissue on 3D imaging. There are no discrete mammographic masses, no areas architectural distortion no suspicious calcifications. Mammographic images were processed with CAD. Targeted ultrasound is performed, showing several small upper outer quadrant left breast cysts. The largest lies in the 10 o'clock position, 3 cm the nipple measuring 6 x 5 x 5 mm. Just peripheral to this at 10 o'clock, 4 cm the nipple, there are 2 small cysts, 1 measuring 6 x 3 x 5 mm in the other 3 mm. There are no solid masses or suspicious lesions. IMPRESSION: 1. No evidence of malignancy. 2. Small benign left breast cysts. No additional short-term follow-up is indicated. RECOMMENDATION: 1. Screening mammogram in November 2018 per normal screening schedule.(Code:SM-B-01Y) I have discussed the findings and recommendations with the patient. Results were also provided in writing at the conclusion of the visit. If applicable, a reminder letter will be sent to the patient regarding the next appointment. BI-RADS CATEGORY  2: Benign. Electronically Signed   By: Lajean Manes M.D.   On: 01/22/2017 15:54    Labs:  CBC:  Recent Labs  10/05/16 1731  WBC 9.1  HGB 13.3  HCT 39.3  PLT 229    COAGS: No results  for input(s): INR, APTT in the last 8760 hours.  BMP:  Recent Labs  10/05/16 1731 10/16/16 1204  NA 138 139  K 3.7 3.9  CL 108 107  CO2 23 26  GLUCOSE 100* 100*  BUN 9 15  CALCIUM 8.5* 9.1  CREATININE 0.73 0.66  GFRNONAA >60  --   GFRAA >60  --     LIVER FUNCTION TESTS:  Recent Labs  10/05/16 1731  BILITOT 0.8  AST 14*  ALT 14  ALKPHOS 67  PROT 6.8  ALBUMIN 3.6    TUMOR MARKERS: No results for input(s): AFPTM, CEA, CA199, CHROMGRNA in the last 8760 hours.  Assessment  and Plan:  Advanced superficial venous insufficiency of the left small saphenous vein.  Current CEAP is C3EpAsPr.  It has been about 5 years since she has worn compression hose consistently.  We'll begin with a 3 month trial of conservative therapy with thigh-high surgical grade (20-30 mmHg) compression hose.  If her symptoms persist or worsen after that, she would be an excellent candidate for percutaneous endothermal ablation of the left small saphenous vein with adjunctive sclerotherapy as needed.   1.) 3 month trial of conservative therapy with thigh-high surgical grade (20-30 mmHg) compression hose 2.) Return to clinic in 3 months for re-assessment.   Thank you for this interesting consult.  I greatly enjoyed meeting Matsue Strom and look forward to participating in their care.  A copy of this report was sent to the requesting provider on this date.  Electronically Signed: Jacqulynn Cadet 02/02/2017, 2:13 PM   I spent a total of  40 Minutes   in face to face in clinical consultation, greater than 50% of which was counseling/coordinating care for superficial venous insufficiency.
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
## 2017-04-04 ENCOUNTER — Emergency Department (HOSPITAL_COMMUNITY): Payer: BLUE CROSS/BLUE SHIELD

## 2017-04-04 ENCOUNTER — Emergency Department (HOSPITAL_COMMUNITY)
Admission: EM | Admit: 2017-04-04 | Discharge: 2017-04-04 | Disposition: A | Discharge status: Home / Self Care | Payer: BLUE CROSS/BLUE SHIELD | Attending: Emergency Medicine | Admitting: Emergency Medicine

## 2017-04-04 ENCOUNTER — Encounter (HOSPITAL_COMMUNITY): Payer: Self-pay | Admitting: Emergency Medicine

## 2017-04-04 DIAGNOSIS — N200 Calculus of kidney: Secondary | ICD-10-CM | POA: Insufficient documentation

## 2017-04-04 DIAGNOSIS — R1031 Right lower quadrant pain: Secondary | ICD-10-CM | POA: Diagnosis present

## 2017-04-04 DIAGNOSIS — Z79899 Other long term (current) drug therapy: Secondary | ICD-10-CM | POA: Diagnosis not present

## 2017-04-04 LAB — URINALYSIS, MICROSCOPIC (REFLEX)

## 2017-04-04 LAB — CBC
HCT: 40.6 % (ref 36.0–46.0)
Hemoglobin: 14.2 g/dL (ref 12.0–15.0)
MCH: 31.4 pg (ref 26.0–34.0)
MCHC: 35 g/dL (ref 30.0–36.0)
MCV: 89.8 fL (ref 78.0–100.0)
Platelets: 210 10*3/uL (ref 150–400)
RBC: 4.52 MIL/uL (ref 3.87–5.11)
RDW: 12.8 % (ref 11.5–15.5)
WBC: 9 10*3/uL (ref 4.0–10.5)

## 2017-04-04 LAB — URINALYSIS, ROUTINE W REFLEX MICROSCOPIC
Bilirubin Urine: NEGATIVE
Glucose, UA: NEGATIVE mg/dL
Ketones, ur: 40 mg/dL — AB
Nitrite: NEGATIVE
Protein, ur: 30 mg/dL — AB
Specific Gravity, Urine: 1.01 (ref 1.005–1.030)
pH: 7.5 (ref 5.0–8.0)

## 2017-04-04 LAB — COMPREHENSIVE METABOLIC PANEL
ALT: 21 U/L (ref 14–54)
AST: 21 U/L (ref 15–41)
Albumin: 3.7 g/dL (ref 3.5–5.0)
Alkaline Phosphatase: 70 U/L (ref 38–126)
Anion gap: 10 (ref 5–15)
BUN: 10 mg/dL (ref 6–20)
CO2: 22 mmol/L (ref 22–32)
Calcium: 8.7 mg/dL — ABNORMAL LOW (ref 8.9–10.3)
Chloride: 105 mmol/L (ref 101–111)
Creatinine, Ser: 0.66 mg/dL (ref 0.44–1.00)
GFR calc Af Amer: >60 mL/min (ref >60)
GFR calc non Af Amer: >60 mL/min (ref >60)
Glucose, Bld: 105 mg/dL — ABNORMAL HIGH (ref 65–99)
Potassium: 3.5 mmol/L (ref 3.5–5.1)
Sodium: 137 mmol/L (ref 135–145)
Total Bilirubin: 0.9 mg/dL (ref 0.3–1.2)
Total Protein: 6.9 g/dL (ref 6.5–8.1)

## 2017-04-04 LAB — LIPASE, BLOOD: Lipase: 25 U/L (ref 11–51)

## 2017-04-04 MED ORDER — ONDANSETRON HCL 4 MG PO TABS: 4.0000 mg | ORAL_TABLET | Freq: Three times a day (TID) | ORAL | 0 refills | Status: DC | PRN

## 2017-04-04 MED ORDER — KETOROLAC TROMETHAMINE 15 MG/ML IJ SOLN
15.0000 mg | Freq: Once | INTRAMUSCULAR | Status: AC
  Administered 2017-04-04: 15 mg via INTRAVENOUS
  Filled 2017-04-04: qty 1

## 2017-04-04 MED ORDER — KETOROLAC TROMETHAMINE 10 MG PO TABS: 10.0000 mg | ORAL_TABLET | Freq: Three times a day (TID) | ORAL | 0 refills | Status: DC | PRN

## 2017-04-04 MED ORDER — SODIUM CHLORIDE 0.9 % IV BOLUS (SEPSIS)
1000.0000 mL | Freq: Once | INTRAVENOUS | Status: AC
  Administered 2017-04-04: 1000 mL via INTRAVENOUS

## 2017-04-04 NOTE — Discharge Instructions (Signed)
Tiene una piedra de rinion.  Toma la pastilla, toradol, para dolor. Toma una pastilla no mas que 3 veces cada dia. Toma la pastilla con comida. No toma ibuprofen, advil, motrin, naproxen, o aleve cuando esta tomando toradol. Puede tomar tylenol si necesita mas auyda con dolor.  Toma zofran si tiene nausea or si va a vomitar.  Toma mucha agua- es muy importante! Es muy importante que da una cita con Education officer, environmental.  Regresa a emergencia si tiene fiebre, mas dolor, o nuevas/sintomas peores.   Take Toradol 3 times a day with meals. Do not take other anti-inflammatories at the same time (Advil, Motrin, naproxen, Aleve, ibuprofen). You may take Tylenol if you need more pain control. Use Zofran as needed for nausea or vomiting. It is important that you drink water to stay well-hydrated. Is very important that you make an appointment with the urologist. Ask them about the prominence of the right renal collecting system and right ureter.  Return to the emergency room if he developed fever, chills, persistent vomiting, worsening pain, or any new or worsening symptoms.

## 2017-04-04 NOTE — ED Provider Notes (Signed)
[facility] DEPT Provider Note   CSN: [id number] Arrival date & time: 04/04/17  3220     History   Chief Complaint Chief Complaint  Patient presents with  . Dysuria    HPI Clova Morlock is a 51 y.o. female presenting with right lower quadrant and right flank pain.  Patient states that around 2:00 this morning, she developed acute onset sharp right lower quadrant and right flank pain. Additionally, she reports dysuria and increased urinary frequency. She denies hematuria. She states it feels similar to when she was diagnosed with pyelonephritis several months ago. Additionally, she reports some discomfort at the sites where she had a bladder sling on 12/09/2016. Patient states that yesterday she felt normal. She denies fever, chills, cough, chest pain, shortness of breath, nausea, vomiting, or abnormal bowel movements. She states her last menstrual period was September 7. She has not taken anything for pain. Walking makes the pain worse, nothing makes it better. She reports history of ovarian cysts, but states this feels different.   HPI  Past Medical History:  Diagnosis Date  . Anxiety   . Depression    no med  . Headache(784.0)    otc med prn  . Hyperlipidemia    diet controlled, no meds  . PMDD (premenstrual dysphoric disorder)   . PONV (postoperative nausea and vomiting)    in the past    Patient Active Problem List   Diagnosis Date Noted  . Abdominal pain, acute, right lower quadrant 10/05/2016  . Personal history of DVT (deep vein thrombosis) 05/19/2016  . Migraine headache 11/19/2015  . PCP NOTES >>>> 05/24/2015  . Depression 05/28/2014  . Varicose veins with pain 04/25/2014  . Chronic venous insufficiency 04/25/2014  . Endometrial polyp 03/26/2014  . Intermenstrual bleeding 03/26/2014  . History of breast abscess 02/19/2014  . Chronic mastitis of left breast 12/21/2013  . Urinary incontinence 05/08/2013  . Hyperlipidemia 09/03/2011  . KNEE PAIN,  RIGHT, CHRONIC 06/05/2008  . DEPRESSION 04/28/2006  . DVT 04/28/2006  . GALACTORRHEA 04/28/2006  . DECREASED LIBIDO 04/28/2006    Past Surgical History:  Procedure Laterality Date  . BLADDER SURGERY  5-30-218  . BREAST BIOPSY  06/19/2011   Procedure: BREAST BIOPSY;  Surgeon: Rolm Bookbinder, MD;  Location: [facility];  Service: General;  Laterality: Right;  Right breast chronic abscess drainage and debridement  . BREAST DUCTAL SYSTEM EXCISION Left 01/24/2014   Procedure:  LEFT BREAST DUCT EXCISION;  Surgeon: Rolm Bookbinder, MD;  Location: [facility];  Service: General;  Laterality: Left;  . BREAST SURGERY  8/12   right breast biopsy  . CESAREAN SECTION     X 3  . DILATATION & CURETTAGE/HYSTEROSCOPY WITH MYOSURE N/A 05/10/2014   Procedure: DILATATION & CURETTAGE/HYSTEROSCOPY WITH MYOSURE ABLATION,RESECTOSCOPIC POLYPECTOMY;  Surgeon: Terrance Mass, MD;  Location: [facility];  Service: Gynecology;  Laterality: N/A;  Confirmed with [employer] rep to be present.  . INCISE AND DRAIN ABCESS     right breast  . TUBAL LIGATION  2004    OB History    Gravida Para Term Preterm AB Living   3 3 3     3    SAB TAB Ectopic Multiple Live Births           3       Home Medications    Prior to Admission medications   Medication Sig Start Date End Date Taking? Authorizing Provider  ketorolac (TORADOL) 10 MG tablet Take 1 tablet (10 mg total) by  mouth every 8 (eight) hours as needed. 04/04/17   Kenedie Dirocco, PA-C  ondansetron (ZOFRAN) 4 MG tablet Take 1 tablet (4 mg total) by mouth every 8 (eight) hours as needed for nausea or vomiting. 04/04/17   Stephanny Tsutsui, PA-C    Family History Family History  Problem Relation Age of Onset  . Heart disease Mother   . Diabetes Other        gm  . High Cholesterol Brother        high TG  . Colon cancer Neg Hx   . Breast cancer Neg Hx   . Esophageal cancer Neg Hx   . Stomach cancer Neg Hx   . Rectal cancer Neg Hx     Social  History Social History  Substance Use Topics  . Smoking status: Never Smoker  . Smokeless tobacco: Never Used  . Alcohol use 0.0 oz/week     Comment: very seldom      Allergies   Patient has no known allergies.   Review of Systems Review of Systems  Constitutional: Negative for chills and fever.  HENT: Negative for congestion and sore throat.   Respiratory: Negative for cough, chest tightness and shortness of breath.   Cardiovascular: Negative for chest pain and leg swelling.  Gastrointestinal: Positive for abdominal pain. Negative for constipation, diarrhea, nausea and vomiting.  Genitourinary: Positive for dysuria, flank pain and frequency. Negative for hematuria.  Musculoskeletal: Negative for back pain and neck pain.  Skin: Negative for wound.  Allergic/Immunologic: Negative for immunocompromised state.  Neurological: Negative for dizziness and headaches.  Hematological: Does not bruise/bleed easily.  Psychiatric/Behavioral: Negative for agitation and confusion.     Physical Exam Updated Vital Signs BP (!) 93/44 (BP Location: Right Arm)   Pulse 60   Temp 98.7 F (37.1 C) (Oral)   Resp 12   Ht 5\' 2"  (1.575 m)   Wt 72.7 kg (160 lb 5 oz)   LMP 03/19/2017 (Exact Date)   SpO2 97%   BMI 29.32 kg/m   Physical Exam  Constitutional: She is oriented to person, place, and time. She appears well-developed and well-nourished. No distress.  HENT:  Head: Normocephalic and atraumatic.  Eyes: EOM are normal.  Neck: Normal range of motion.  Cardiovascular: Normal rate, regular rhythm and intact distal pulses.   Pulmonary/Chest: Effort normal and breath sounds normal. No respiratory distress. She has no decreased breath sounds. She has no wheezes. She has no rhonchi.  Abdominal: Soft. Normal appearance and bowel sounds are normal. She exhibits no distension. There is tenderness in the right lower quadrant and suprapubic area. There is no rigidity, no rebound, no guarding, no CVA  tenderness, no tenderness at McBurney's point and negative Murphy's sign.  Minimal tenderness to palpation of right lower quadrant and suprapubic abdomen. Pain extends around to the lower back. No rigidity or guarding. No rebound. Incision sites from bladder sling are well-healed, without erythema or fluctuance.  Musculoskeletal: Normal range of motion.  Lymphadenopathy:    She has no cervical adenopathy.  Neurological: She is alert and oriented to person, place, and time.  Skin: Skin is warm and dry.  Psychiatric: She has a normal mood and affect.  Nursing note and vitals reviewed.    ED Treatments / Results  Labs (all labs ordered are listed, but only abnormal results are displayed) Labs Reviewed  COMPREHENSIVE METABOLIC PANEL - Abnormal; Notable for the following:       Result Value   Glucose, Bld 105 (*)  Calcium 8.7 (*)    All other components within normal limits  URINALYSIS, ROUTINE W REFLEX MICROSCOPIC - Abnormal; Notable for the following:    APPearance CLOUDY (*)    Hgb urine dipstick MODERATE (*)    Ketones, ur 40 (*)    Protein, ur 30 (*)    Leukocytes, UA LARGE (*)    All other components within normal limits  URINALYSIS, MICROSCOPIC (REFLEX) - Abnormal; Notable for the following:    Bacteria, UA RARE (*)    Squamous Epithelial / LPF 0-5 (*)    Non Squamous Epithelial 0-5 (*)    All other components within normal limits  URINE CULTURE  LIPASE, BLOOD  CBC  PREGNANCY, URINE    EKG  EKG Interpretation None       Radiology Ct Renal Stone Study  Result Date: 04/04/2017 CLINICAL DATA:  Right-sided flank pain and dysuria for 2 days EXAM: CT ABDOMEN AND PELVIS WITHOUT CONTRAST TECHNIQUE: Multidetector CT imaging of the abdomen and pelvis was performed following the standard protocol without IV contrast. COMPARISON:  10/05/2016. FINDINGS: Lower chest: No acute abnormality. Hepatobiliary: No focal liver abnormality is seen. No gallstones, gallbladder wall  thickening, or biliary dilatation. Pancreas: Unremarkable. No pancreatic ductal dilatation or surrounding inflammatory changes. Spleen: Normal in size without focal abnormality. Adrenals/Urinary Tract: The adrenal glands are within normal limits. The left kidney shows a tiny nonobstructing stone in the lower pole. The ureter is within normal limits. Slight decreased attenuation of the right kidney is noted with mild hydronephrosis and hydroureter. This extends inferiorly to just above the urinary bladder. Phleboliths are seen within the pelvis. No definitive ureteral stone is noted. These changes have progressed somewhat in the interval from the prior exam. Stomach/Bowel: The appendix is within normal limits. Scattered mild diverticular change of the colon is seen without inflammatory change. The small bowel is unremarkable. Small hiatal hernia is seen. Vascular/Lymphatic: No significant vascular findings are present. No enlarged abdominal or pelvic lymph nodes. Reproductive: Uterus and bilateral adnexa are unremarkable. Other: No abdominal wall hernia or abnormality. No abdominopelvic ascites. Musculoskeletal: No acute or significant osseous findings. IMPRESSION: Tiny nonobstructing left renal stone. Prominence of the right renal collecting system and right ureter which extend into the distal ureter without definitive obstructing stone. These changes have progressed in the interval from the prior exam. These changes could be related to recently passed stone although the possibility of the ureteral abnormality would deserve consideration given the relative chronicity. No other focal abnormality is noted. Electronically Signed   By: Inez Catalina M.D.   On: 04/04/2017 14:48    Procedures Procedures (including critical care time)  Medications Ordered in ED Medications  sodium chloride 0.9 % bolus 1,000 mL (0 mLs Intravenous Stopped 04/04/17 1611)  ketorolac (TORADOL) 15 MG/ML injection 15 mg (15 mg Intravenous  Given 04/04/17 1402)     Initial Impression / Assessment and Plan / ED Course  I have reviewed the triage vital signs and the nursing notes.  Pertinent labs & imaging results that were available during my care of the patient were reviewed by me and considered in my medical decision making (see chart for details).     Patient presenting with acute onset right lower quadrant and right flank pain. No nausea, vomiting, fever, or chills. Physical exam reassuring, as patient is afebrile, not tachycardic. She does not appear very distressed. Minimal tenderness to palpation. CBC and CMP reassuring. UA shows moderate blood, rare bacteria, and no nitrates. Positive leuks. Will  send for culture. Will start IV fluids, give ketorolac for pain, and order CT renal.  CT renal shows prominence of right renal collecting system and right ureter which extends into the distal ureter without definitive obstructing stone. This is a progression from prior exam. Case discussed with attending, Dr. Alvino Chapel agrees to plan. Will discharge with prescriptions for pain and nausea. Patient pain completely resolved ketorolac, I do not believe she needs opioid medications at this time. Although she has no nausea now, will prescribe prescription for Zofran in case she becomes nauseous. Instructioned to stay well-hydrated. Doubt infection of urine at this time, I'll not provide antibiotics. Patient instructed to follow-up with urology.  Patient appears safe for discharge. Return precautions given. Patient states she understands and agrees to plan.   Final Clinical Impressions(s) / ED Diagnoses   Final diagnoses:  Right kidney stone    New Prescriptions New Prescriptions   KETOROLAC (TORADOL) 10 MG TABLET    Take 1 tablet (10 mg total) by mouth every 8 (eight) hours as needed.   ONDANSETRON (ZOFRAN) 4 MG TABLET    Take 1 tablet (4 mg total) by mouth every 8 (eight) hours as needed for nausea or vomiting.     Franchot Heidelberg, PA-C 04/04/17 1612    Davonna Belling, MD 04/06/17 0010

## 2017-04-04 NOTE — ED Triage Notes (Signed)
Pt reports R flank pain and dysuria for the past 2 days accompanied by chills. No n/v/d.

## 2017-04-12 ENCOUNTER — Encounter: Payer: Self-pay | Admitting: Rheumatology

## 2017-04-12 ENCOUNTER — Ambulatory Visit (INDEPENDENT_AMBULATORY_CARE_PROVIDER_SITE_OTHER): Payer: BLUE CROSS/BLUE SHIELD

## 2017-04-12 ENCOUNTER — Ambulatory Visit (INDEPENDENT_AMBULATORY_CARE_PROVIDER_SITE_OTHER): Payer: BLUE CROSS/BLUE SHIELD | Admitting: Rheumatology

## 2017-04-12 ENCOUNTER — Ambulatory Visit (INDEPENDENT_AMBULATORY_CARE_PROVIDER_SITE_OTHER): Payer: Self-pay

## 2017-04-12 VITALS — BP 120/62 | HR 80 | Ht 62.0 in | Wt 158.0 lb

## 2017-04-12 DIAGNOSIS — M79641 Pain in right hand: Secondary | ICD-10-CM | POA: Diagnosis not present

## 2017-04-12 DIAGNOSIS — M25461 Effusion, right knee: Secondary | ICD-10-CM

## 2017-04-12 DIAGNOSIS — M2170 Unequal limb length (acquired), unspecified site: Secondary | ICD-10-CM | POA: Diagnosis not present

## 2017-04-12 DIAGNOSIS — M79642 Pain in left hand: Secondary | ICD-10-CM

## 2017-04-12 DIAGNOSIS — Z8659 Personal history of other mental and behavioral disorders: Secondary | ICD-10-CM | POA: Diagnosis not present

## 2017-04-12 DIAGNOSIS — R768 Other specified abnormal immunological findings in serum: Secondary | ICD-10-CM

## 2017-04-12 DIAGNOSIS — Z8669 Personal history of other diseases of the nervous system and sense organs: Secondary | ICD-10-CM

## 2017-04-12 DIAGNOSIS — Z8679 Personal history of other diseases of the circulatory system: Secondary | ICD-10-CM

## 2017-04-12 DIAGNOSIS — Z86718 Personal history of other venous thrombosis and embolism: Secondary | ICD-10-CM

## 2017-04-12 DIAGNOSIS — M25561 Pain in right knee: Secondary | ICD-10-CM | POA: Diagnosis not present

## 2017-04-12 DIAGNOSIS — G8929 Other chronic pain: Secondary | ICD-10-CM

## 2017-04-12 DIAGNOSIS — N29 Other disorders of kidney and ureter in diseases classified elsewhere: Secondary | ICD-10-CM

## 2017-04-14 ENCOUNTER — Other Ambulatory Visit (HOSPITAL_COMMUNITY): Payer: Self-pay | Admitting: Interventional Radiology

## 2017-04-14 DIAGNOSIS — I8312 Varicose veins of left lower extremity with inflammation: Secondary | ICD-10-CM

## 2017-04-15 ENCOUNTER — Other Ambulatory Visit: Payer: Self-pay | Admitting: Obstetrics & Gynecology

## 2017-04-15 DIAGNOSIS — Z1231 Encounter for screening mammogram for malignant neoplasm of breast: Secondary | ICD-10-CM

## 2017-04-17 LAB — PROTEIN ELECTROPHORESIS, SERUM
Albumin ELP: 3.7 g/dL — ABNORMAL LOW (ref 3.8–4.8)
Alpha 1: 0.3 g/dL (ref 0.2–0.3)
Alpha 2: 0.7 g/dL (ref 0.5–0.9)
Beta 2: 0.4 g/dL (ref 0.2–0.5)
Beta Globulin: 0.5 g/dL (ref 0.4–0.6)
Gamma Globulin: 1 g/dL (ref 0.8–1.7)
Total Protein: 6.6 g/dL (ref 6.1–8.1)

## 2017-04-17 LAB — CYCLIC CITRUL PEPTIDE ANTIBODY, IGG: Cyclic Citrullin Peptide Ab: <16 UNITS

## 2017-04-17 LAB — HEPATITIS PANEL, ACUTE
Hep A IgM: NONREACTIVE
Hep B C IgM: NONREACTIVE
Hepatitis B Surface Ag: NONREACTIVE
Hepatitis C Ab: NONREACTIVE
SIGNAL TO CUT-OFF: 0.01 (ref <1.00)

## 2017-04-17 LAB — URINALYSIS, ROUTINE W REFLEX MICROSCOPIC
Bacteria, UA: NONE SEEN /HPF
Bilirubin Urine: NEGATIVE
Glucose, UA: NEGATIVE
Hgb urine dipstick: NEGATIVE
Hyaline Cast: NONE SEEN /LPF
Nitrite: NEGATIVE
Protein, ur: NEGATIVE
RBC / HPF: NONE SEEN /HPF (ref 0–2)
Specific Gravity, Urine: 1.019 (ref 1.001–1.03)
pH: 5.5 (ref 5.0–8.0)

## 2017-04-17 LAB — IGG, IGA, IGM
IgG (Immunoglobin G), Serum: 868 mg/dL (ref 694–1618)
IgM, Serum: 264 mg/dL (ref 48–271)
Immunoglobulin A: 354 mg/dL (ref 81–463)

## 2017-04-17 LAB — RHEUMATOID FACTOR: Rhuematoid fact SerPl-aCnc: <14 IU/mL (ref <14)

## 2017-04-17 LAB — ANTI-DNA ANTIBODY, DOUBLE-STRANDED: ds DNA Ab: 1 IU/mL

## 2017-04-17 LAB — QUANTIFERON TB GOLD ASSAY (BLOOD)
Mitogen-Nil: >10 IU/mL
QUANTIFERON(R)-TB GOLD: NEGATIVE
Quantiferon Nil Value: 0.03 IU/mL
Quantiferon Tb Ag Minus Nil Value: 0 IU/mL

## 2017-04-17 LAB — ANTI-SMITH ANTIBODY: ENA SM Ab Ser-aCnc: <1 AI

## 2017-04-17 LAB — C3 AND C4
C3 Complement: 143 mg/dL (ref 83–193)
C4 Complement: 28 mg/dL (ref 15–57)

## 2017-04-17 LAB — TSH: TSH: 1.08 mIU/L

## 2017-04-17 LAB — SJOGRENS SYNDROME-B EXTRACTABLE NUCLEAR ANTIBODY: SSB (La) (ENA) Antibody, IgG: <1 AI

## 2017-04-17 LAB — ANTI-SCLERODERMA ANTIBODY: Scleroderma (Scl-70) (ENA) Antibody, IgG: <1 AI

## 2017-04-17 LAB — SJOGRENS SYNDROME-A EXTRACTABLE NUCLEAR ANTIBODY: SSA (Ro) (ENA) Antibody, IgG: <1 AI

## 2017-04-17 LAB — CK: Total CK: 63 U/L (ref 29–143)

## 2017-04-17 LAB — URIC ACID: Uric Acid, Serum: 5.4 mg/dL (ref 2.5–7.0)

## 2017-04-17 LAB — HLA-B27 ANTIGEN: HLA-B27 Antigen: NEGATIVE

## 2017-04-17 LAB — GLUCOSE 6 PHOSPHATE DEHYDROGENASE: G-6PDH: 15.4 U/g Hgb (ref 7.0–20.5)

## 2017-04-17 LAB — RNP ANTIBODY: Ribonucleic Protein(ENA) Antibody, IgG: <1 AI

## 2017-04-17 NOTE — Progress Notes (Signed)
WNLs

## 2017-04-19 ENCOUNTER — Telehealth: Payer: Self-pay | Admitting: Rheumatology

## 2017-04-19 NOTE — Telephone Encounter (Signed)
Per patient rt knee is swollen again. Patient wants to be worked it for fluid drawn off knee. Please call to advise.

## 2017-04-20 NOTE — Telephone Encounter (Signed)
11:15 tomorrow I called her to advise but she can not come in at that time, will you let me know if there is another cancellation this week we can get her in to aspirate her knee ?

## 2017-04-28 ENCOUNTER — Encounter: Payer: Self-pay | Admitting: Radiology

## 2017-05-03 ENCOUNTER — Ambulatory Visit (INDEPENDENT_AMBULATORY_CARE_PROVIDER_SITE_OTHER): Payer: BLUE CROSS/BLUE SHIELD | Admitting: Rheumatology

## 2017-05-03 ENCOUNTER — Encounter: Payer: Self-pay | Admitting: Rheumatology

## 2017-05-03 VITALS — BP 112/64 | HR 57 | Ht 62.0 in | Wt 159.0 lb

## 2017-05-03 DIAGNOSIS — G8929 Other chronic pain: Secondary | ICD-10-CM

## 2017-05-03 DIAGNOSIS — M25461 Effusion, right knee: Secondary | ICD-10-CM | POA: Diagnosis not present

## 2017-05-03 DIAGNOSIS — M25561 Pain in right knee: Principal | ICD-10-CM

## 2017-05-03 MED ORDER — TRIAMCINOLONE ACETONIDE 40 MG/ML IJ SUSP
40.0000 mg | INTRAMUSCULAR | Status: AC | PRN
  Administered 2017-05-03: 40 mg via INTRA_ARTICULAR

## 2017-05-03 MED ORDER — LIDOCAINE HCL 2 % IJ SOLN
2.0000 mL | INTRAMUSCULAR | Status: AC | PRN
Start: 2017-05-03 — End: 2017-05-03
  Administered 2017-05-03: 2 mL

## 2017-05-03 MED ORDER — LIDOCAINE HCL (PF) 1 % IJ SOLN
3.0000 mL | INTRAMUSCULAR | Status: AC | PRN
  Administered 2017-05-03: 3 mL

## 2017-05-03 NOTE — Progress Notes (Signed)
   Procedure Note  Patient: Taylor Parker             Date of Birth: 1966-05-22           MRN: [id number]             Visit Date: 05/03/2017  Procedures: Visit Diagnoses: Chronic pain of right knee  Knee effusion, right  Large Joint Inj Date/Time: 05/03/2017 1:47 PM Performed by: Bo Merino Authorized by: Bo Merino   Consent Given by:  Patient Site marked: the procedure site was marked   Timeout: prior to procedure the correct patient, procedure, and site was verified   Indications:  Pain and joint swelling Location:  Knee Site:  R knee Prep: patient was prepped and draped in usual sterile fashion   Needle Size:  22 G Needle Length:  1.5 inches Approach:  Medial Ultrasound Guidance: No   Fluoroscopic Guidance: No   Arthrogram: No   Medications:  40 mg triamcinolone acetonide 40 MG/ML; 2 mL lidocaine 2 %; 3 mL lidocaine (PF) 1 % Aspiration Attempted: Yes   Aspirate amount (mL):  0 Patient tolerance:  Patient tolerated the procedure well with no immediate complications    Bo Merino, MD

## 2017-05-04 ENCOUNTER — Ambulatory Visit
Admission: RE | Admit: 2017-05-04 | Discharge: 2017-05-04 | Disposition: A | Discharge status: Home / Self Care | Payer: BLUE CROSS/BLUE SHIELD | Source: Ambulatory Visit | Attending: Interventional Radiology | Admitting: Interventional Radiology

## 2017-05-04 DIAGNOSIS — I8312 Varicose veins of left lower extremity with inflammation: Secondary | ICD-10-CM

## 2017-05-04 HISTORY — PX: IR RADIOLOGIST EVAL & MGMT: IMG5224

## 2017-05-04 NOTE — Progress Notes (Signed)
Chief Complaint: Left leg swelling and pain  Referring Physician(s): Colon Branch  Supervising Physician: Jacqulynn Cadet  History of Present Illness: Taylor Parker is a 51 y.o. female with a history of left lower extremity superficial venous insufficiency.    Approximately 5 years ago she was diagnosed with insufficiency of the left GSV and she underwent percutaneous ablative therapy at another center.    She had relief of her left leg pain, heaviness, swelling and aching for several years.   Unfortunately, over the past year or so she has begun to develop recurrent symptoms along the posterior aspect of the calf.    She works at [employer] and is required to stand long hours.  She has bulging varicose veins which become painful when standing.  She also describes heaviness and swelling worse at the end of the day.   She has been compliant with her compression hose over the last 3 months and has worn them every day  She states the left leg continues to swell despite the compression hose.  She states her leg is not as swollen on her days off.  She denies RLE symptoms, chest pain or shortness of breath.   Venous doppler reveals incompetence of the left SSV and some incomptence of a recanalized portion of the left GSV.  Past Medical History:  Diagnosis Date  . Anxiety   . Depression    no med  . Headache(784.0)    otc med prn  . Hyperlipidemia    diet controlled, no meds  . PMDD (premenstrual dysphoric disorder)   . PONV (postoperative nausea and vomiting)    in the past    Past Surgical History:  Procedure Laterality Date  . BLADDER SURGERY  5-30-218  . BREAST BIOPSY  06/19/2011   Procedure: BREAST BIOPSY;  Surgeon: Rolm Bookbinder, MD;  Location: [facility];  Service: General;  Laterality: Right;  Right breast chronic abscess drainage and debridement  . BREAST DUCTAL SYSTEM EXCISION Left 01/24/2014   Procedure:  LEFT BREAST DUCT  EXCISION;  Surgeon: Rolm Bookbinder, MD;  Location: [facility];  Service: General;  Laterality: Left;  . BREAST SURGERY  8/12   right breast biopsy  . CESAREAN SECTION     X 3  . DILATATION & CURETTAGE/HYSTEROSCOPY WITH MYOSURE N/A 05/10/2014   Procedure: DILATATION & CURETTAGE/HYSTEROSCOPY WITH MYOSURE ABLATION,RESECTOSCOPIC POLYPECTOMY;  Surgeon: Terrance Mass, MD;  Location: [facility];  Service: Gynecology;  Laterality: N/A;  Confirmed with [employer] rep to be present.  . INCISE AND DRAIN ABCESS     right breast  . TUBAL LIGATION  2004    Allergies: Patient has no known allergies.  Medications: Prior to Admission medications   Medication Sig Start Date End Date Taking? Authorizing Provider  ketorolac (TORADOL) 10 MG tablet Take 1 tablet (10 mg total) by mouth every 8 (eight) hours as needed. Patient not taking: Reported on 04/12/2017 04/04/17   Caccavale, Sophia, PA-C  ondansetron (ZOFRAN) 4 MG tablet Take 1 tablet (4 mg total) by mouth every 8 (eight) hours as needed for nausea or vomiting. Patient not taking: Reported on 04/12/2017 04/04/17   Caccavale, Sophia, PA-C     Family History  Problem Relation Age of Onset  . Heart disease Mother   . Diabetes Other        gm  . High Cholesterol Brother        high TG  . Colon cancer Neg Hx   . Breast cancer Neg  Hx   . Esophageal cancer Neg Hx   . Stomach cancer Neg Hx   . Rectal cancer Neg Hx     Social History   Social History  . Marital status: Married    Spouse name: N/A  . Number of children: 3  . Years of education: N/A   Occupational History  . [employer]  7pm-4 am    Social History Main Topics  . Smoking status: Never Smoker  . Smokeless tobacco: Never Used  . Alcohol use 0.0 oz/week     Comment: very seldom   . Drug use: No  . Sexual activity: Yes    Birth control/ protection: Surgical     Comment: BTL   Other Topics Concern  . Not on file   Social History Narrative   Original from [place]          Review  of Systems: A 12 point ROS discussed   Review of Systems  Constitutional: Negative.   HENT: Negative.   Respiratory: Negative.   Cardiovascular: Positive for leg swelling.  Gastrointestinal: Negative.   Genitourinary: Negative.   Musculoskeletal: Negative.   Skin: Negative.   Neurological: Negative.   Hematological: Negative.   Psychiatric/Behavioral: Negative.     Vital Signs: BP 106/60 (BP Location: Right Arm, Patient Position: Sitting, Cuff Size: Normal)   Pulse 67   Temp 98 F (36.7 C)   Resp 12   SpO2 99%   Physical Exam  Constitutional: She is oriented to person, place, and time. She appears well-developed.  HENT:  Head: Normocephalic and atraumatic.  Eyes: EOM are normal.  Neck: Normal range of motion.  Cardiovascular: Normal rate.   Pulmonary/Chest: Effort normal.  Musculoskeletal: Normal range of motion. She exhibits edema.       Legs: Left leg with 1+ edema - visibly more swollen than the right leg. Left calf with numerous large varicose veins No ulcers  Neurological: She is alert and oriented to person, place, and time.  Skin: Skin is warm and dry.  Psychiatric: She has a normal mood and affect. Her behavior is normal. Judgment and thought content normal.  Vitals reviewed.    Imaging: CLINICAL DATA:  51 year old female with left calf pain, swelling and venous varicosities  EXAM: Left LOWER EXTREMITY VENOUS DUPLEX ULTRASOUND  TECHNIQUE: Gray-scale sonography with graded compression, as well as color Doppler and duplex ultrasound, were performed to evaluate the deep and superficial veins of the left lower extremity. Spectral Doppler was utilized to evaluate flow at rest and with distal augmentation maneuvers. A complete superficial venous insufficiency exam was performed upright standing and reverse Trendelenburg positions. I personally performed the technical portion of the exam.  COMPARISON:  CT scan of the abdomen and pelvis dated  10/05/2016  FINDINGS: Deep Venous System:  Evaluation of the deep venous system including the common femoral, femoral, profunda femoral, popliteal and calf veins (where visible) demonstrate no evidence of deep venous thrombosis. The vessels are compressible and demonstrate normal respiratory phasicity and response to augmentation. No evidence of deep venous reflux.  Superficial Venous System:  SFJ: Patent and unremarkable.  GSV Prox Thigh: Occluded  GSV Mid Thigh: Occluded  GSV Lower Thigh: Possible recanalization of a short segments of the great saphenous vein secondary to to incompetent perforator veins. This segment of the GSV demonstrates incompetent flow lasting for 6,342 milliseconds.  GSV Prox Calf: Occluded  GSV Mid Calf: Occlude  GSV Distal Calf: Occluded  SPJ: Patent.  SSV Prox: Dilated and incompetent.  The vessel measures 1 cm in diameter and demonstrates incompetent flow lasting for 2,200 milliseconds  SSV Mid: Dilated and incompetent with greater than 6,000 milliseconds incompetent flow.  SSV Distal: Difficult to identify due to numerous superficial venous collaterals arising from the proximal and mid SSB.  Other: Superficial venous varicosities all along the posterior aspect of the left calf.  IMPRESSION: 1. Positive for significant superficial venous insufficiency involving the left small saphenous vein which gives rise to numerous dilated superficial venous varicosities. 2. Changes of prior ablated therapy of the left great saphenous vein. There is a short segment of the great saphenous vein which is recanalized due to the presence of 2 incompetent perforator veins in the distal thigh. 3. No evidence of deep venous thrombosis. Signed,  Criselda Peaches, MD  [facility]  [facility]   Electronically Signed   By: Jacqulynn Cadet M.D.   On: 02/02/2017  18:02   Labs:  CBC:  Recent Labs  10/05/16 1731 04/04/17 1157  WBC 9.1 9.0  HGB 13.3 14.2  HCT 39.3 40.6  PLT 229 210    COAGS: No results for input(s): INR, APTT in the last 8760 hours.  BMP:  Recent Labs  10/05/16 1731 10/16/16 1204 04/04/17 1157  NA 138 139 137  K 3.7 3.9 3.5  CL 108 107 105  CO2 23 26 22   GLUCOSE 100* 100* 105*  BUN 9 15 10   CALCIUM 8.5* 9.1 8.7*  CREATININE 0.73 0.66 0.66  GFRNONAA >60  --  >60  GFRAA >60  --  >60    LIVER FUNCTION TESTS:  Recent Labs  10/05/16 1731 04/04/17 1157 04/12/17 0955  BILITOT 0.8 0.9  --   AST 14* 21  --   ALT 14 21  --   ALKPHOS 67 70  --   PROT 6.8 6.9 6.6  ALBUMIN 3.6 3.7  --     TUMOR MARKERS: No results for input(s): AFPTM, CEA, CA199, CHROMGRNA in the last 8760 hours.  Assessment:  Advanced superficial venous insufficiency of the left small saphenous vein.    Her CEAP classification is [id number].   She has failed her 3 month trial of conservative therapy with thigh-high surgical grade (20-30 mmHg) compression hose.   She is an excellent candidate for percutaneous endothermal ablation of the left small saphenous vein with adjunctive sclerotherapy as needed.   Will obtain insurance approval and schedule her for the procedure.  Risks and benefits of vein ablation were discussed with the patient including, but not limited to bleeding, infection, vascular injury, deep vein thrombosis, and the fact that this procedure is an attempt to remedy symptoms and is not for cosmesis.   Electronically Signed: Murrell Redden PA-C 05/04/2017, 10:37 AM   Please refer to Dr. Katrinka Blazing attestation of this note for management and plan.

## 2017-05-11 DIAGNOSIS — M170 Bilateral primary osteoarthritis of knee: Secondary | ICD-10-CM | POA: Insufficient documentation

## 2017-05-11 DIAGNOSIS — N29 Other disorders of kidney and ureter in diseases classified elsewhere: Secondary | ICD-10-CM

## 2017-05-11 DIAGNOSIS — M19041 Primary osteoarthritis, right hand: Secondary | ICD-10-CM | POA: Insufficient documentation

## 2017-05-11 DIAGNOSIS — M19042 Primary osteoarthritis, left hand: Secondary | ICD-10-CM | POA: Insufficient documentation

## 2017-05-11 DIAGNOSIS — M1990 Unspecified osteoarthritis, unspecified site: Secondary | ICD-10-CM | POA: Insufficient documentation

## 2017-05-11 NOTE — Progress Notes (Signed)
Office Visit Note  Patient: Taylor Parker             Date of Birth: 01-13-1966           MRN: [id number]             PCP: Wanda Plump, MD Referring: Wanda Plump, MD Visit Date: 05/13/2017 Occupation: @GUAROCC @    Subjective:  Pain in hands and knee joints..   History of Present Illness: Taylor Parker is a 51 y.o. female with history of osteoarthritis of hands and knee joints. She states she continues to have discomfort in her bilateral hands and bilateral knee joints. She had right knee joint cortisone injection without much relief.  Activities of Daily Living:  Patient reports morning stiffness for 10 minutes.   Patient Denies nocturnal pain.  Difficulty dressing/grooming: Denies Difficulty climbing stairs: Denies Difficulty getting out of chair: Denies Difficulty using hands for taps, buttons, cutlery, and/or writing: Denies   Review of Systems  Constitutional: Positive for fatigue. Negative for night sweats, weight gain, weight loss and weakness.  HENT: Negative for mouth sores, trouble swallowing, trouble swallowing, mouth dryness and nose dryness.   Eyes: Negative for pain, redness, visual disturbance and dryness.  Respiratory: Negative for cough, shortness of breath and difficulty breathing.   Cardiovascular: Negative for chest pain, palpitations, hypertension, irregular heartbeat and swelling in legs/feet.  Gastrointestinal: Negative for blood in stool, constipation and diarrhea.  Endocrine: Negative for increased urination.  Genitourinary: Negative for vaginal dryness.  Musculoskeletal: Positive for arthralgias, joint pain and morning stiffness. Negative for joint swelling, myalgias, muscle weakness, muscle tenderness and myalgias.  Skin: Negative for color change, rash, hair loss, skin tightness, ulcers and sensitivity to sunlight.  Allergic/Immunologic: Negative for susceptible to infections.  Neurological: Negative for dizziness, memory loss and  night sweats.  Hematological: Negative for swollen glands.  Psychiatric/Behavioral: Negative for depressed mood and sleep disturbance. The patient is not nervous/anxious.     PMFS History:  Patient Active Problem List   Diagnosis Date Noted  . Primary osteoarthritis of both knees 05/11/2017  . Primary osteoarthritis of both hands 05/11/2017  . History of migraine 05/11/2017  . History of depression 05/11/2017  . Renal calcinosis 05/11/2017  . Abdominal pain, acute, right lower quadrant 10/05/2016  . Personal history of DVT (deep vein thrombosis) 05/19/2016  . Migraine headache 11/19/2015  . PCP NOTES >>>> 05/24/2015  . Depression 05/28/2014  . Varicose veins with pain 04/25/2014  . Chronic venous insufficiency 04/25/2014  . Endometrial polyp 03/26/2014  . Intermenstrual bleeding 03/26/2014  . History of breast abscess 02/19/2014  . Chronic mastitis of left breast 12/21/2013  . Urinary incontinence 05/08/2013  . Hyperlipidemia 09/03/2011  . KNEE PAIN, RIGHT, CHRONIC 06/05/2008  . DEPRESSION 04/28/2006  . DVT 04/28/2006  . GALACTORRHEA 04/28/2006  . DECREASED LIBIDO 04/28/2006    Past Medical History:  Diagnosis Date  . Anxiety   . Depression    no med  . Headache(784.0)    otc med prn  . Hyperlipidemia    diet controlled, no meds  . PMDD (premenstrual dysphoric disorder)   . PONV (postoperative nausea and vomiting)    in the past    Family History  Problem Relation Age of Onset  . Heart disease Mother   . Diabetes Other        gm  . High Cholesterol Brother        high TG  . Colon cancer Neg Hx   . Breast  cancer Neg Hx   . Esophageal cancer Neg Hx   . Stomach cancer Neg Hx   . Rectal cancer Neg Hx    Past Surgical History:  Procedure Laterality Date  . BLADDER SURGERY  5-30-218  . BREAST BIOPSY  06/19/2011   Procedure: BREAST BIOPSY;  Surgeon: Rolm Bookbinder, MD;  Location: [facility];  Service: General;  Laterality: Right;  Right breast  chronic abscess drainage and debridement  . BREAST DUCTAL SYSTEM EXCISION Left 01/24/2014   Procedure:  LEFT BREAST DUCT EXCISION;  Surgeon: Rolm Bookbinder, MD;  Location: [facility];  Service: General;  Laterality: Left;  . BREAST SURGERY  8/12   right breast biopsy  . CESAREAN SECTION     X 3  . DILATATION & CURETTAGE/HYSTEROSCOPY WITH MYOSURE N/A 05/10/2014   Procedure: DILATATION & CURETTAGE/HYSTEROSCOPY WITH MYOSURE ABLATION,RESECTOSCOPIC POLYPECTOMY;  Surgeon: Terrance Mass, MD;  Location: [facility];  Service: Gynecology;  Laterality: N/A;  Confirmed with [employer] rep to be present.  . INCISE AND DRAIN ABCESS     right breast  . IR RADIOLOGIST EVAL & MGMT  05/04/2017  . TUBAL LIGATION  2004   Social History   Social History Narrative   Original from [place]            Objective: Vital Signs: BP (!) 103/48 (BP Location: Left Arm, Patient Position: Sitting, Cuff Size: Normal)   Pulse (!) 53   Resp 16   Ht '5\' 2"'$  (1.575 m)   Wt 152 lb (68.9 kg)   BMI 27.80 kg/m    Physical Exam  Constitutional: She is oriented to person, place, and time. She appears well-developed and well-nourished.  HENT:  Head: Normocephalic and atraumatic.  Eyes: Conjunctivae and EOM are normal.  Neck: Normal range of motion.  Cardiovascular: Normal rate, regular rhythm, normal heart sounds and intact distal pulses.   Pulmonary/Chest: Effort normal and breath sounds normal.  Abdominal: Soft. Bowel sounds are normal.  Lymphadenopathy:    She has no cervical adenopathy.  Neurological: She is alert and oriented to person, place, and time.  Skin: Skin is warm and dry. Capillary refill takes less than 2 seconds.  Psychiatric: She has a normal mood and affect. Her behavior is normal.  Nursing note and vitals reviewed.    Musculoskeletal Exam:C-spine and thoracic lumbar spine good range of motion. Shoulder joints elbow joints wrist joint MCPs PIPs DIPs with good range of motion. She has some DIP PIP  thickening in her hands. She had tenderness on palpation over right trochanteric bursa consistent with trochanteric bursitis. She has discomfort and crepitus with range of motion of her right knee joint. The effusion has resolved.  CDAI Exam: No CDAI exam completed.    Investigation: Findings:  01/19/2017 ANA positive with Nucleolar pattern titer 1:640 ,CRP High sensitivity elevated 4.9, and Sed Rate normal, 12  04/12/2017 UA 1+ leukocytes, bacteria negative SPEP low albumin, immunoglobulins normal, TB gold negative, hepatitis panel negative G6PD normal CK 63, TSH normal, C3-C4 normal, ENA negative, RF negative, anti-CCP negative, HLA-B27 negative, uric acid 5.4  Imaging: Ir Radiologist Eval & Mgmt  Result Date: 05/12/2017 Please refer to notes tab for details about interventional procedure. (Op Note)   Speciality Comments: OK to work in appt w/Dr. Estanislado Pandy if patient has flare.    Procedures:  No procedures performed Allergies: Patient has no known allergies.   Assessment / Plan:     Visit Diagnoses: Primary osteoarthritis of both knees - Right knee joint  with moderate osteoarthritis and moderate chondromalacia patella. Effusion is resolved now after the cortisone injection. Although she continues to have some pain and discomfort. I'll refer to physical therapy.  Primary osteoarthritis of both hands: She does have some underlying osteoarthritis no synovitis was noted.  Trochanteric bursitis of right hip she has ongoing pain and discomfort in her right trochanteric area. After informed consent was obtained different treatment options were discussed right trochanter was injected with cortisone which she tolerated well. The procedures described above. She was referred to physical therapy for trochanteric bursitis.  Effusion of right knee -  CRP elevated, ESR normal, RF negative, CCP antibody negative, uric acid normal  Positive ANA (antinuclear antibody) - ANA 1:640 nucleolar,  C3-C4 normal, ENA negative. I will repeat her ANA in 6 months or if she develops any new symptoms sooner than that. She has no other clinical features of autoimmune disease on examination.  Other medical problems are listed as follows:  Personal history of DVT (deep vein thrombosis)  History of migraine  History of depression  Renal calcinosis    Orders: No orders of the defined types were placed in this encounter.  No orders of the defined types were placed in this encounter.   Face-to-face time spent with patient was 30 minutes. greater than 50% of time was spent in counseling and coordination of care.  Follow-Up Instructions: Return in about 6 months (around 11/10/2017) for Osteoarthritis.   Bo Merino, MD  Note - This record has been created using Editor, commissioning.  Chart creation errors have been sought, but may not always  have been located. Such creation errors do not reflect on  the standard of medical care.

## 2017-05-12 ENCOUNTER — Encounter: Payer: Self-pay | Admitting: Interventional Radiology

## 2017-05-13 ENCOUNTER — Encounter: Payer: Self-pay | Admitting: Rheumatology

## 2017-05-13 ENCOUNTER — Ambulatory Visit (INDEPENDENT_AMBULATORY_CARE_PROVIDER_SITE_OTHER): Payer: BLUE CROSS/BLUE SHIELD | Admitting: Rheumatology

## 2017-05-13 VITALS — BP 103/48 | HR 53 | Resp 16 | Ht 62.0 in | Wt 152.0 lb

## 2017-05-13 DIAGNOSIS — M19042 Primary osteoarthritis, left hand: Secondary | ICD-10-CM | POA: Diagnosis not present

## 2017-05-13 DIAGNOSIS — Z86718 Personal history of other venous thrombosis and embolism: Secondary | ICD-10-CM

## 2017-05-13 DIAGNOSIS — M25461 Effusion, right knee: Secondary | ICD-10-CM

## 2017-05-13 DIAGNOSIS — Z8659 Personal history of other mental and behavioral disorders: Secondary | ICD-10-CM | POA: Diagnosis not present

## 2017-05-13 DIAGNOSIS — M170 Bilateral primary osteoarthritis of knee: Secondary | ICD-10-CM

## 2017-05-13 DIAGNOSIS — M19041 Primary osteoarthritis, right hand: Secondary | ICD-10-CM | POA: Diagnosis not present

## 2017-05-13 DIAGNOSIS — R768 Other specified abnormal immunological findings in serum: Secondary | ICD-10-CM

## 2017-05-13 DIAGNOSIS — N29 Other disorders of kidney and ureter in diseases classified elsewhere: Secondary | ICD-10-CM | POA: Diagnosis not present

## 2017-05-13 DIAGNOSIS — M7061 Trochanteric bursitis, right hip: Secondary | ICD-10-CM

## 2017-05-13 DIAGNOSIS — Z8669 Personal history of other diseases of the nervous system and sense organs: Secondary | ICD-10-CM

## 2017-05-13 NOTE — Patient Instructions (Signed)
Knee Exercises Ask your health care provider which exercises are safe for you. Do exercises exactly as told by your health care provider and adjust them as directed. It is normal to feel mild stretching, pulling, tightness, or discomfort as you do these exercises, but you should stop right away if you feel sudden pain or your pain gets worse.Do not begin these exercises until told by your health care provider. STRETCHING AND RANGE OF MOTION EXERCISES These exercises warm up your muscles and joints and improve the movement and flexibility of your knee. These exercises also help to relieve pain, numbness, and tingling. Exercise A: Knee Extension, Prone 1. Lie on your abdomen on a bed. 2. Place your left / right knee just beyond the edge of the surface so your knee is not on the bed. You can put a towel under your left / right thigh just above your knee for comfort. 3. Relax your leg muscles and allow gravity to straighten your knee. You should feel a stretch behind your left / right knee. 4. Hold this position for __________ seconds. 5. Scoot up so your knee is supported between repetitions. Repeat __________ times. Complete this stretch __________ times a day. Exercise B: Knee Flexion, Active  1. Lie on your back with both knees straight. If this causes back discomfort, bend your left / right knee so your foot is flat on the floor. 2. Slowly slide your left / right heel back toward your buttocks until you feel a gentle stretch in the front of your knee or thigh. 3. Hold this position for __________ seconds. 4. Slowly slide your left / right heel back to the starting position. Repeat __________ times. Complete this exercise __________ times a day. Exercise C: Quadriceps, Prone  1. Lie on your abdomen on a firm surface, such as a bed or padded floor. 2. Bend your left / right knee and hold your ankle. If you cannot reach your ankle or pant leg, loop a belt around your foot and grab the belt  instead. 3. Gently pull your heel toward your buttocks. Your knee should not slide out to the side. You should feel a stretch in the front of your thigh and knee. 4. Hold this position for __________ seconds. Repeat __________ times. Complete this stretch __________ times a day. Exercise D: Hamstring, Supine 1. Lie on your back. 2. Loop a belt or towel over the ball of your left / right foot. The ball of your foot is on the walking surface, right under your toes. 3. Straighten your left / right knee and slowly pull on the belt to raise your leg until you feel a gentle stretch behind your knee. ? Do not let your left / right knee bend while you do this. ? Keep your other leg flat on the floor. 4. Hold this position for __________ seconds. Repeat __________ times. Complete this stretch __________ times a day. STRENGTHENING EXERCISES These exercises build strength and endurance in your knee. Endurance is the ability to use your muscles for a long time, even after they get tired. Exercise E: Quadriceps, Isometric  1. Lie on your back with your left / right leg extended and your other knee bent. Put a rolled towel or small pillow under your knee if told by your health care provider. 2. Slowly tense the muscles in the front of your left / right thigh. You should see your kneecap slide up toward your hip or see increased dimpling just above the knee. This   motion will push the back of the knee toward the floor. 3. For __________ seconds, keep the muscle as tight as you can without increasing your pain. 4. Relax the muscles slowly and completely. Repeat __________ times. Complete this exercise __________ times a day. Exercise F: Straight Leg Raises - Quadriceps 1. Lie on your back with your left / right leg extended and your other knee bent. 2. Tense the muscles in the front of your left / right thigh. You should see your kneecap slide up or see increased dimpling just above the knee. Your thigh may  even shake a bit. 3. Keep these muscles tight as you raise your leg 4-6 inches (10-15 cm) off the floor. Do not let your knee bend. 4. Hold this position for __________ seconds. 5. Keep these muscles tense as you lower your leg. 6. Relax your muscles slowly and completely after each repetition. Repeat __________ times. Complete this exercise __________ times a day. Exercise G: Hamstring, Isometric 1. Lie on your back on a firm surface. 2. Bend your left / right knee approximately __________ degrees. 3. Dig your left / right heel into the surface as if you are trying to pull it toward your buttocks. Tighten the muscles in the back of your thighs to dig as hard as you can without increasing any pain. 4. Hold this position for __________ seconds. 5. Release the tension gradually and allow your muscles to relax completely for __________ seconds after each repetition. Repeat __________ times. Complete this exercise __________ times a day. Exercise H: Hamstring Curls  If told by your health care provider, do this exercise while wearing ankle weights. Begin with __________ weights. Then increase the weight by 1 lb (0.5 kg) increments. Do not wear ankle weights that are more than __________. 1. Lie on your abdomen with your legs straight. 2. Bend your left / right knee as far as you can without feeling pain. Keep your hips flat against the floor. 3. Hold this position for __________ seconds. 4. Slowly lower your leg to the starting position.  Repeat __________ times. Complete this exercise __________ times a day. Exercise I: Squats (Quadriceps) 1. Stand in front of a table, with your feet and knees pointing straight ahead. You may rest your hands on the table for balance but not for support. 2. Slowly bend your knees and lower your hips like you are going to sit in a chair. ? Keep your weight over your heels, not over your toes. ? Keep your lower legs upright so they are parallel with the table  legs. ? Do not let your hips go lower than your knees. ? Do not bend lower than told by your health care provider. ? If your knee pain increases, do not bend as low. 3. Hold the squat position for __________ seconds. 4. Slowly push with your legs to return to standing. Do not use your hands to pull yourself to standing. Repeat __________ times. Complete this exercise __________ times a day. Exercise J: Wall Slides (Quadriceps)  1. Lean your back against a smooth wall or door while you walk your feet out 18-24 inches (46-61 cm) from it. 2. Place your feet hip-width apart. 3. Slowly slide down the wall or door until your knees bend __________ degrees. Keep your knees over your heels, not over your toes. Keep your knees in line with your hips. 4. Hold for __________ seconds. Repeat __________ times. Complete this exercise __________ times a day. Exercise K: Straight Leg Raises -   Hip Abductors 1. Lie on your side with your left / right leg in the top position. Lie so your head, shoulder, knee, and hip line up. You may bend your bottom knee to help you keep your balance. 2. Roll your hips slightly forward so your hips are stacked directly over each other and your left / right knee is facing forward. 3. Leading with your heel, lift your top leg 4-6 inches (10-15 cm). You should feel the muscles in your outer hip lifting. ? Do not let your foot drift forward. ? Do not let your knee roll toward the ceiling. 4. Hold this position for __________ seconds. 5. Slowly return your leg to the starting position. 6. Let your muscles relax completely after each repetition. Repeat __________ times. Complete this exercise __________ times a day. Exercise L: Straight Leg Raises - Hip Extensors 1. Lie on your abdomen on a firm surface. You can put a pillow under your hips if that is more comfortable. 2. Tense the muscles in your buttocks and lift your left / right leg about 4-6 inches (10-15 cm). Keep your knee  straight as you lift your leg. 3. Hold this position for __________ seconds. 4. Slowly lower your leg to the starting position. 5. Let your leg relax completely after each repetition. Repeat __________ times. Complete this exercise __________ times a day. This information is not intended to replace advice given to you by your health care provider. Make sure you discuss any questions you have with your health care provider. Document Released: 05/13/2005 Document Revised: 03/23/2016 Document Reviewed: 05/05/2015 Elsevier Interactive Patient Education  2018 [place] [facility] Ask your health care provider which exercises are safe for you. Do exercises exactly as told by your health care provider and adjust them as directed. It is normal to feel mild stretching, pulling, tightness, or discomfort as you do these exercises, but you should stop right away if you feel sudden pain or your pain gets worse.Do not begin these exercises until told by your health care provider. Stretching and range of motion exercises These exercises warm up your muscles and joints and improve the movement and flexibility of your hip and pelvis. Exercise A: Quadriceps, prone  1. Lie on your abdomen on a firm surface, such as a bed or padded floor. 2. Bend your left / right knee and hold your ankle. If you cannot reach your ankle or pant leg, loop a belt around your foot and grab the belt instead. 3. Gently pull your heel toward your buttocks. Your knee should not slide out to the side. You should feel a stretch in the front of your thigh and knee. 4. Hold this position for __________ seconds. Repeat __________ times. Complete this stretch __________ times a day. Exercise B: Iliotibial band  1. Lie on your side with your left / right leg in the top position. 2. Bend both of your knees and grab your left / right ankle. Stretch out your bottom arm to help you balance. 3. Slowly bring your top knee  back so your thigh goes behind your trunk. 4. Slowly lower your top leg toward the floor until you feel a gentle stretch on the outside of your left / right hip and thigh. If you do not feel a stretch and your knee will not fall farther, place the heel of your other foot on top of your knee and pull your knee down toward the floor with your foot. 5. Hold this position  position for __________ seconds. Repeat __________ times. Complete this stretch __________ times a day. Strengthening exercises These exercises build strength and endurance in your hip and pelvis. Endurance is the ability to use your muscles for a long time, even after they get tired. Exercise C: Straight leg raises ( hip abductors) 1. Lie on your side with your left / right leg in the top position. Lie so your head, shoulder, knee, and hip line up. You may bend your bottom knee to help you balance. 2. Roll your hips slightly forward so your hips are stacked directly over each other and your left / right knee is facing forward. 3. Tense the muscles in your outer thigh and lift your top leg 4-6 inches (10-15 cm). 4. Hold this position for __________ seconds. 5. Slowly return to the starting position. Let your muscles relax completely before doing another repetition. Repeat __________ times. Complete this exercise __________ times a day. Exercise D: Straight leg raises ( hip extensors) 1. Lie on your abdomen on your bed or a firm surface. You can put a pillow under your hips if that is more comfortable. 2. Bend your left / right knee so your foot is straight up in the air. 3. Squeeze your buttock muscles and lift your left / right thigh off the bed. Do not let your back arch. 4. Tense this muscle as hard as you can without increasing any knee pain. 5. Hold this position for __________ seconds. 6. Slowly lower your leg to the starting position and allow it to relax completely. Repeat __________ times. Complete this exercise __________ times a  day. Exercise E: Hip hike 1. Stand sideways on a bottom step. Stand on your left / right leg with your other foot unsupported next to the step. You can hold onto the railing or wall if needed for balance. 2. Keep your knees straight and your torso square. Then, lift your left / right hip up toward the ceiling. 3. Slowly let your left / right hip lower toward the floor, past the starting position. Your foot should get closer to the floor. Do not lean or bend your knees. Repeat __________ times. Complete this exercise __________ times a day. This information is not intended to replace advice given to you by your health care provider. Make sure you discuss any questions you have with your health care provider. Document Released: 06/29/2005 Document Revised: 03/03/2016 Document Reviewed: 05/31/2015 Elsevier Interactive Patient Education  2018 [employer]  

## 2017-05-18 ENCOUNTER — Other Ambulatory Visit (HOSPITAL_COMMUNITY): Payer: Self-pay | Admitting: Interventional Radiology

## 2017-05-18 DIAGNOSIS — I83812 Varicose veins of left lower extremities with pain: Secondary | ICD-10-CM

## 2017-05-25 ENCOUNTER — Ambulatory Visit (INDEPENDENT_AMBULATORY_CARE_PROVIDER_SITE_OTHER): Payer: BLUE CROSS/BLUE SHIELD | Admitting: Obstetrics & Gynecology

## 2017-05-25 ENCOUNTER — Encounter: Payer: Self-pay | Admitting: Obstetrics & Gynecology

## 2017-05-25 VITALS — BP 126/80 | Ht 62.0 in | Wt 151.0 lb

## 2017-05-25 DIAGNOSIS — Z9851 Tubal ligation status: Secondary | ICD-10-CM

## 2017-05-25 DIAGNOSIS — Z01419 Encounter for gynecological examination (general) (routine) without abnormal findings: Secondary | ICD-10-CM | POA: Diagnosis not present

## 2017-05-25 DIAGNOSIS — Z1151 Encounter for screening for human papillomavirus (HPV): Secondary | ICD-10-CM | POA: Diagnosis not present

## 2017-05-25 NOTE — Progress Notes (Signed)
Blossie Raffel 1965/12/29 [id number]   History:    51 y.o. G3P3 Married.  S/P Tubal ligation.  RP: Established pat for annual gyn exam   HPI:  Menses every 21-24 days, normal flow.  No hot flushes/night sweats.  No pelvic pain.  Breasts wnl.  But remote h/o Mastitis with scarring.  Urine/BMs wnl.  Health labs with Family MD.  Past medical history,surgical history, family history and social history were all reviewed and documented in the EPIC chart.  Gynecologic History Patient's last menstrual period was 05/25/2017. Contraception: tubal ligation Last Pap: 2015. Results were: normal Last mammogram: 2018. Results were: normal Colono 2017  Obstetric History OB History  Gravida Para Term Preterm AB Living  3 3 3     3   SAB TAB Ectopic Multiple Live Births          3    # Outcome Date GA Lbr Len/2nd Weight Sex Delivery Anes PTL Lv  3 Term     M CS-Unspec  N LIV  2 Term     M CS-Unspec  N LIV  1 Term     F CS-Unspec  N LIV       ROS: A ROS was performed and pertinent positives and negatives are included in the history.  GENERAL: No fevers or chills. HEENT: No change in vision, no earache, sore throat or sinus congestion. NECK: No pain or stiffness. CARDIOVASCULAR: No chest pain or pressure. No palpitations. PULMONARY: No shortness of breath, cough or wheeze. GASTROINTESTINAL: No abdominal pain, nausea, vomiting or diarrhea, melena or bright red blood per rectum. GENITOURINARY: No urinary frequency, urgency, hesitancy or dysuria. MUSCULOSKELETAL: No joint or muscle pain, no back pain, no recent trauma. DERMATOLOGIC: No rash, no itching, no lesions. ENDOCRINE: No polyuria, polydipsia, no heat or cold intolerance. No recent change in weight. HEMATOLOGICAL: No anemia or easy bruising or bleeding. NEUROLOGIC: No headache, seizures, numbness, tingling or weakness. PSYCHIATRIC: No depression, no loss of interest in normal activity or change in sleep pattern.     Exam:   BP  126/80   Ht 5\' 2"  (1.575 m)   Wt 151 lb (68.5 kg)   LMP 05/25/2017   BMI 27.62 kg/m   Body mass index is 27.62 kg/m.  General appearance : Well developed well nourished female. No acute distress HEENT: Eyes: no retinal hemorrhage or exudates,  Neck supple, trachea midline, no carotid bruits, no thyroidmegaly Lungs: Clear to auscultation, no rhonchi or wheezes, or rib retractions  Heart: Regular rate and rhythm, no murmurs or gallops Breast:Examined in sitting and supine position were symmetrical in appearance, no palpable masses or tenderness,  no skin retraction, no nipple inversion, no nipple discharge, no skin discoloration, no axillary or supraclavicular lymphadenopathy Abdomen: no palpable masses or tenderness, no rebound or guarding Extremities: no edema or skin discoloration or tenderness  Pelvic: Vulva normal  Bartholin, Urethra, Skene Glands: Within normal limits             Vagina: No gross lesions or discharge  Cervix: No gross lesions or discharge.  Pap/HPV HR done.  Uterus  AV, normal size, shape and consistency, non-tender and mobile  Adnexa  Without masses or tenderness  Anus and perineum  normal     Assessment/Plan:  51 y.o. female for annual exam   1. Encounter for routine gynecological examination with Papanicolaou smear of cervix Normal gynecologic exam.  Pap test with HPV high risk done.  Breast exam normal.  Last mammogram 2018 normal.  Colonoscopy 2017.  Will do health labs with family physician.  2. Tubal ligation status   Princess Bruins MD, 4:44 PM 05/25/2017

## 2017-05-27 ENCOUNTER — Ambulatory Visit
Admission: RE | Admit: 2017-05-27 | Discharge: 2017-05-27 | Disposition: A | Discharge status: Home / Self Care | Payer: BLUE CROSS/BLUE SHIELD | Source: Ambulatory Visit | Attending: Obstetrics & Gynecology | Admitting: Obstetrics & Gynecology

## 2017-05-27 DIAGNOSIS — Z1231 Encounter for screening mammogram for malignant neoplasm of breast: Secondary | ICD-10-CM

## 2017-05-27 LAB — PAP, TP IMAGING W/ HPV RNA, RFLX HPV TYPE 16,18/45: HPV DNA High Risk: NOT DETECTED

## 2017-05-30 ENCOUNTER — Encounter: Payer: Self-pay | Admitting: Obstetrics & Gynecology

## 2017-05-30 NOTE — Patient Instructions (Signed)
1. Encounter for routine gynecological examination with Papanicolaou smear of cervix Normal gynecologic exam.  Pap test with HPV high risk done.  Breast exam normal.  Last mammogram 2018 normal.  Colonoscopy 2017.  Will do health labs with family physician.  2. Tubal ligation status  Taylor Parker, fue un placer conocerla hoy!  Voy a informarla de Financial trader.  [place] (Health Maintenance, Female) Un estilo de vida saludable y los cuidados preventivos pueden favorecer considerablemente a la salud y Musician. Pregunte a su mdico cul es el cronograma de exmenes peridicos apropiado para usted. Esta es una buena oportunidad para consultarlo sobre cmo prevenir enfermedades y [place] sano. Adems de los controles, hay muchas otras cosas que puede hacer usted mismo. Los expertos han realizado numerosas investigaciones [employer] cambios en el estilo de vida y las medidas de prevencin que, [place], lo ayudarn a mantenerse sano. Solicite a su mdico ms informacin. EL PESO Y LA DIETA Consuma una dieta saludable.  Asegrese de [employer] verduras, frutas, productos lcteos de bajo contenido de [place] y Advertising account planner.  No consuma muchos alimentos de alto contenido de grasas slidas, azcares agregados o sal.  Realice actividad fsica con regularidad. Esta es una de las prcticas ms importantes que puede hacer por su salud. ? La Delorise Shiner de los adultos deben hacer ejercicio durante al menos 170mnutos por semana. El ejercicio debe aumentar la frecuencia cardaca y pActorla transpiracin (ejercicio de [place]. ? La mayora de los adultos tambin deben hacer ejercicios de elongacin al mToysRusveces a la semana. Agregue esto al su plan de ejercicio de intensidad moderada. Mantenga un peso saludable.  El ndice de masa corporal ([facility] es una medida que puede utilizarse para identificar posibles problemas de pAlden Proporciona  una estimacin de la grasa corporal basndose en el peso y la altura. Su mdico puede ayudarle a dRadiation protection practitionerICascadey a lScientist, forensico mTheatre managerun peso saludable.  Para las mujeres de 20aos o ms: ? Un IThe Corpus Christi Medical Center - Northwestmenor de 18,5 se considera bajo peso. ? Un IPurcell Municipal Hospitalentre 18,5 y 24,9 es normal. ? Un ILake Norman Regional Medical Centerentre 25 y 29,9 se considera sobrepeso. ? Un IMC de 30 o ms se considera obesidad. Observe los niveles de colesterol y lpidos en la sangre.  Debe comenzar a rEnglish as a second language teacherde lpidos y cResearch officer, trade unionen la sangre a los 20aos y luego repetirlos cada 563aos  Es posible que nAutomotive engineerlos niveles de colesterol con mayor frecuencia si: ? Sus niveles de lpidos y colesterol son altos. ? Es mayor de [id number] ? Presenta un alto riesgo de padecer enfermedades cardacas. DETECCIN DE CNCER Cncer de pulmn  Se recomienda realizar exmenes de deteccin de cncer de pulmn a personas adultas entre 558y 811aos que estn en riesgo de dHorticulturist, commercialde pulmn por sus antecedentes de consumo de tabaco.  Se recomienda una tomografa computarizada de baja dosis de los pulmones todos los aos a las personas que: ? Fuman actualmente. ? Hayan dejado el hbito en algn momento en los ltimos 15aos. ? Hayan fumado durante 30aos un paquete diario. Un paquete-ao equivale a fumar un promedio de un paquete de cigarrillos diario durante un ao.  Los exmenes de deteccin anuales deben continuar hasta que hayan pasado 15aos desde que dej de fumar.  Ya no debern realizarse si tiene un problema de salud que le impida recibir tratamiento para eScience writerde pulmn. Cncer de mama  Practique la autoconciencia de la  mama. Esto significa reconocer la apariencia normal de sus mamas y cmo las siente.  Tambin significa realizar autoexmenes regulares de [employer]. Informe a su mdico sobre cualquier cambio, sin importar cun pequeo sea.  Si tiene entre 20 y 17 aos, un mdico debe realizarle un examen clnico  de las mamas como parte del examen regular de [place], cada 1 a 3aos.  Si tiene 40aos o ms, debe Information systems manager clnico de las Microsoft. Tambin considere realizarse una [place] todos los [place].  Si tiene antecedentes familiares de cncer de mama, hable con su mdico para someterse a un estudio gentico.  Si tiene alto riesgo de Chief Financial Officer de mama, hable con su mdico para someterse a Public house manager y [employer].  La evaluacin del gen del cncer de mama (BRCA) se recomienda a mujeres que tengan familiares con cnceres relacionados con el BRCA. Los cnceres relacionados con el BRCA incluyen los siguientes: ? [place]. ? Ovario. ? Trompas. ? Cnceres de peritoneo.  Los resultados de la evaluacin determinarn la necesidad de asesoramiento gentico y de [place] de BRCA1 y BRCA2. Cncer de cuello del tero El mdico puede recomendarle que se haga pruebas peridicas de deteccin de cncer de los rganos de la pelvis (ovarios, tero y vagina). Estas pruebas incluyen un examen plvico, que abarca controlar si se produjeron cambios microscpicos en la superficie del cuello del tero (prueba de Papanicolaou). Pueden recomendarle que se haga estas pruebas cada 3aos, a partir de los 21aos.  A las mujeres que tienen entre 30 y 9aos, los mdicos pueden recomendarles que se sometan a exmenes plvicos y pruebas de Papanicolaou cada 71aos, o a la prueba de Papanicolaou y el examen plvico en combinacin con estudios de deteccin del virus del papiloma humano (VPH) cada 5aos. Algunos tipos de VPH aumentan el riesgo de Chief Financial Officer de cuello del tero. La prueba para la deteccin del VPH tambin puede realizarse a mujeres de cualquier edad cuyos resultados de la prueba de Papanicolaou no sean claros.  Es posible que otros mdicos no recomienden exmenes de deteccin a mujeres no embarazadas que se consideran sujetos de  bajo riesgo de Chief Financial Officer de pelvis y que no tienen sntomas. Pregntele al mdico si un examen plvico de deteccin es adecuado para usted.  Si ha recibido un tratamiento para Science writer cervical o una enfermedad que podra causar cncer, necesitar realizarse una prueba de Papanicolaou y controles durante al menos 29 aos de concluido el [place]. Si no se ha hecho el Papanicolaou con regularidad, debern volver a evaluarse los factores de riesgo (como tener un nuevo compaero sexual), para Teacher, adult education si debe realizarse los estudios nuevamente. Algunas mujeres sufren problemas mdicos que aumentan la probabilidad de Museum/gallery curator cncer de cuello del tero. En estos casos, el mdico podr [employer] se realicen controles y pruebas de Papanicolaou con ms frecuencia. Cncer colorrectal  Este tipo de cncer puede detectarse y a menudo prevenirse.  Por lo general, los estudios de rutina se deben Medical laboratory scientific officer a Field seismologist a Proofreader de los 39 aos y Fowler 71 aos.  Sin embargo, el mdico podr aconsejarle que lo haga antes, si tiene factores de riesgo para el cncer de colon.  Tambin puede recomendarle que use un kit de prueba para Hydrologist en la materia fecal.  Es posible que se use una pequea cmara en el extremo de un tubo para examinar directamente el colon (sigmoidoscopia o colonoscopia) a  fin de Hydrographic surveyor formas tempranas de cncer colorrectal.  Los exmenes de rutina generalmente comienzan a los 50aos.  El examen directo del colon se debe repetir cada 5 a 10aos hasta los 75aos. Sin embargo, es posible que se realicen exmenes con mayor frecuencia, si se detectan formas tempranas de plipos precancerosos o pequeos bultos. Cncer de piel  Revise la piel de la cabeza a los pies con regularidad.  Informe a su mdico si aparecen nuevos lunares o los que tiene se modifican, especialmente en su forma y color.  Tambin notifique al mdico si tiene un lunar que es ms grande que el  tamao de una goma de lpiz.  Siempre use pantalla solar. Aplique pantalla solar de Kerry Dory y repetida a lo largo del Training and development officer.  Protjase usando mangas y [employer], un sombrero de ala ancha y gafas para el sol, siempre que se encuentre en el exterior. ENFERMEDADES CARDACAS, DIABETES E HIPERTENSIN ARTERIAL  La hipertensin arterial causa enfermedades cardacas y [place] el riesgo de ictus. La hipertensin arterial es ms probable en los siguientes casos: ? Las personas que tienen la presin arterial en el extremo del rango normal (100-139/85-89 mm Hg). ? [employer] con sobrepeso u obesidad. ? Scientist, water quality.  Si usted tiene entre 18 y 39 aos, debe medirse la presin arterial cada 3 a 5 aos. Si usted tiene 40 aos o ms, debe medirse la presin arterial [employer]. Debe medirse la presin arterial dos veces: una vez cuando est en un hospital o una clnica y la otra vez cuando est en otro sitio. Registre el promedio de [employer]. Para controlar su presin arterial cuando no est en un hospital o [place], puede usar lo siguiente: ? Jorje Guild automtica para medir la presin arterial en una farmacia. ? Un monitor para medir la presin arterial en el hogar.  Si tiene entre 61 y 71 aos, consulte a su mdico si debe tomar aspirina para prevenir el ictus.  Realcese exmenes de deteccin de la diabetes con regularidad. Esto incluye la toma de [place] de sangre para controlar el nivel de azcar en la sangre durante el [place]. ? Si tiene un peso normal y un bajo riesgo de padecer diabetes, realcese este anlisis cada tres aos despus de los 45aos. ? Si tiene sobrepeso y un alto riesgo de padecer diabetes, considere someterse a este anlisis antes o con mayor frecuencia. PREVENCIN DE INFECCIONES HepatitisB  Si tiene un riesgo ms alto de Museum/gallery curator hepatitis B, debe someterse a un examen de deteccin de este virus. Se considera que tiene un alto  riesgo de contraer hepatitis B si: ? Naci en un pas donde la hepatitis B es frecuente. Pregntele a su mdico qu pases son considerados de Public affairs consultant. ? Sus padres nacieron en un pas de alto riesgo y usted no recibi una vacuna que lo proteja contra la hepatitis B (vacuna contra la hepatitis B). ? [place]. ? [place] agujas para inyectarse drogas. ? Vive con alguien que tiene hepatitis B. ? Ha tenido sexo con alguien que tiene hepatitis B. ? Recibe tratamiento de hemodilisis. ? Toma ciertos medicamentos para el cncer, trasplante de rganos y afecciones autoinmunitarias. Hepatitis C  Se recomienda un anlisis de [place] para: ? [employer] 1945 y 1965. ? Todas las personas que tengan un riesgo de haber contrado hepatitis C. Enfermedades de transmisin sexual (ETS).  Debe realizarse pruebas de deteccin de enfermedades de transmisin sexual (ETS),  incluidas gonorrea y clamidia si: ? Es sexualmente [place] y es menor de 50IBB. ? Es mayor de 24aos, y Investment banker, operational informa que corre riesgo de tener este tipo de infecciones. ? La actividad sexual ha cambiado desde que le hicieron la ltima prueba de deteccin y tiene un riesgo mayor de Best boy clamidia o Radio broadcast assistant. Pregntele al mdico si usted tiene riesgo.  Si no tiene el VIH, pero corre riesgo de infectarse por el virus, se recomienda tomar diariamente un medicamento recetado para evitar la infeccin. Esto se conoce como profilaxis previa a la exposicin. Se considera que est en riesgo si: ? Es [place] sexualmente y no [place] preservativos habitualmente o no conoce el estado del VIH de sus Advertising copywriter. ? Se inyecta drogas. ? Es [place] sexualmente con Ardelia Mems pareja que tiene VIH. Consulte a su mdico para saber si tiene un alto riesgo de infectarse por el VIH. Si opta por comenzar la profilaxis previa a la exposicin, primero debe realizarse anlisis de deteccin del VIH. Luego, le harn anlisis cada 71mses mientras  est tomando los medicamentos para la profilaxis previa a la exposicin. [facility] Si es premenopusica y puede quedar [place] solicite a su mdico asesoramiento previo a la concepcin.  Si puede quedar embarazada, tome 400 a [id number](mcg) de cido fAnheuser-Busch  Si desea evitar el embarazo, hable con su mdico sobre el control de la natalidad (anticoncepcin). OSTEOPOROSIS Y MENOPAUSIA  La osteoporosis es una enfermedad en la que los huesos pierden los minerales y la fuerza por el avance de la edad. El resultado pueden ser fracturas graves en los hLone Oak El riesgo de osteoporosis puede identificarse con uArdelia Memsprueba de densidad sea.  Si tiene 65aos o ms, o si est en riesgo de sufrir osteoporosis y fracturas, pregunte a su mdico si debe someterse a exmenes.  Consulte a su mdico si debe tomar un suplemento de calcio o de vitamina D para reducir el riesgo de osteoporosis.  La menopausia puede presentar ciertos sntomas fsicos y rGaffer  La terapia de reemplazo hormonal puede reducir algunos de estos sntomas y rGaffer Consulte a su mdico para saber si la terapia de reemplazo hormonal es conveniente para usted. INSTRUCCIONES PARA EL CUIDADO EN EL HOGAR  Realcese los estudios de rutina de la salud, dentales y de lPublic librarian  MDade City North  No consuma ningn producto que contenga tabaco, lo que incluye cigarrillos, tabaco de mHigher education careers advisero cPsychologist, sport and exercise  Si est embarazada, no beba alcohol.  Si est amamantando, reduzca el consumo de alcohol y la frecuencia con la que consume.  Si es mujer y no est embarazada limite el consumo de alcohol a no ms de 1 medida por da. Una medida equivale a 12onzas de cerveza, 5onzas de vino o 1onzas de bebidas alcohlicas de alta graduacin.  No consuma drogas.  No comparta agujas.  Solicite ayuda a su mdico si necesita apoyo o informacin para abandonar las drogas.  Informe a su mdico  si a menudo se siente deprimido.  Notifique a su mdico si alguna vez ha sido vctima de abuso o si no se siente seguro en su hogar. Esta informacin no tiene cMarine scientistel consejo del mdico. Asegrese de hacerle al mdico cualquier pregunta que tenga. Document Released: 06/18/2011 Document Revised: 07/20/2014 Document Reviewed: 04/02/2015 Elsevier Interactive Patient Education  2[employer]

## 2017-06-09 ENCOUNTER — Ambulatory Visit
Admission: RE | Admit: 2017-06-09 | Discharge: 2017-06-09 | Disposition: A | Discharge status: Home / Self Care | Payer: BLUE CROSS/BLUE SHIELD | Source: Ambulatory Visit | Attending: Interventional Radiology | Admitting: Interventional Radiology

## 2017-06-09 ENCOUNTER — Inpatient Hospital Stay (HOSPITAL_COMMUNITY)
Admission: AD | Admit: 2017-06-09 | Discharge: 2017-06-09 | Disposition: A | Discharge status: Home / Self Care | Payer: BLUE CROSS/BLUE SHIELD | Source: Ambulatory Visit | Attending: Obstetrics & Gynecology | Admitting: Obstetrics & Gynecology

## 2017-06-09 ENCOUNTER — Encounter: Payer: Self-pay | Admitting: Interventional Radiology

## 2017-06-09 ENCOUNTER — Encounter: Payer: Self-pay | Admitting: Radiology

## 2017-06-09 ENCOUNTER — Other Ambulatory Visit (HOSPITAL_COMMUNITY): Payer: Self-pay | Admitting: Interventional Radiology

## 2017-06-09 DIAGNOSIS — N760 Acute vaginitis: Secondary | ICD-10-CM | POA: Insufficient documentation

## 2017-06-09 DIAGNOSIS — I83812 Varicose veins of left lower extremities with pain: Secondary | ICD-10-CM

## 2017-06-09 DIAGNOSIS — R102 Pelvic and perineal pain: Secondary | ICD-10-CM | POA: Diagnosis present

## 2017-06-09 DIAGNOSIS — Z79899 Other long term (current) drug therapy: Secondary | ICD-10-CM | POA: Insufficient documentation

## 2017-06-09 DIAGNOSIS — E785 Hyperlipidemia, unspecified: Secondary | ICD-10-CM | POA: Insufficient documentation

## 2017-06-09 HISTORY — PX: IR EMBO VENOUS NOT HEMORR HEMANG  INC GUIDE ROADMAPPING: IMG5447

## 2017-06-09 LAB — URINALYSIS, ROUTINE W REFLEX MICROSCOPIC
Bilirubin Urine: NEGATIVE
Glucose, UA: NEGATIVE mg/dL
Hgb urine dipstick: NEGATIVE
Ketones, ur: NEGATIVE mg/dL
Leukocytes, UA: NEGATIVE
Nitrite: NEGATIVE
Protein, ur: NEGATIVE mg/dL
Specific Gravity, Urine: 1.006 (ref 1.005–1.030)
pH: 6 (ref 5.0–8.0)

## 2017-06-09 LAB — WET PREP, GENITAL
Clue Cells Wet Prep HPF POC: NONE SEEN
Sperm: NONE SEEN
Trich, Wet Prep: NONE SEEN
Yeast Wet Prep HPF POC: NONE SEEN

## 2017-06-09 LAB — POCT PREGNANCY, URINE: Preg Test, Ur: NEGATIVE

## 2017-06-09 MED ORDER — LIDOCAINE HCL 2 % EX GEL: 1.0000 "application " | CUTANEOUS | 0 refills | Status: DC | PRN

## 2017-06-09 MED ORDER — LIDOCAINE HCL 2 % EX GEL
1.0000 "application " | Freq: Once | CUTANEOUS | Status: AC
  Administered 2017-06-09: 1 via TOPICAL
  Filled 2017-06-09: qty 5

## 2017-06-09 MED ORDER — DIAZEPAM 5 MG PO TABS: 10.0000 mg | ORAL_TABLET | Freq: Once | ORAL | Status: DC

## 2017-06-09 NOTE — Progress Notes (Signed)
0938  Informed consent obtained.  Given verbal & written discharge instructions.  Patient states that she understands.   0800  22 gauge x 1" insyte catheter started IV Left antecubital (x 1 attempt) w/ 7"extension Saline Lock.  Flushed w/ 10 mL NSS without difficulty.  Site unremarkable.  0825  Procedure started.  1829  Procedure completed.   0945  Patient wearing thigh high compression garment, 20-30 mm Hg.    0950  Saline lock d/c'd, catheter intact.    1000  Ambulated x 10 mins, gait steady.  Tolerated well.    1010  Reviewed discharge instructions.  Friend available to provide transportation to home.  1015  Discharged to home.  Kaylyne Axton Riki Rusk, RN 06/09/2017 10:42 AM

## 2017-06-09 NOTE — Progress Notes (Signed)
Cultures obtained by L. Leftwich-Kirby, CNM per pt request.

## 2017-06-09 NOTE — MAU Note (Signed)
Pt reports a boil that came up on her outer vagina yesterday. Very painful.

## 2017-06-09 NOTE — MAU Provider Note (Signed)
Chief Complaint: Vaginal Pain   First Provider Initiated Contact with Patient 06/09/17 1652      SUBJECTIVE HPI: Taylor Parker is a 51 y.o. G3P3003 who presents to maternity admissions reporting pain and a possible boil in her vagina starting 3 days ago, worsening yesterday. She reports the boil became larger after it started, then she pressed on it and it broke open and drained today, with bloody drainage. Now, the boil is smaller but still painful.  There are no associated symptoms.   She denies vaginal bleeding, vaginal itching/burning, urinary symptoms, h/a, dizziness, n/v, or fever/chills.     HPI  Past Medical History:  Diagnosis Date  . Anxiety   . Depression    no med  . Headache(784.0)    otc med prn  . Hyperlipidemia    diet controlled, no meds  . PMDD (premenstrual dysphoric disorder)   . PONV (postoperative nausea and vomiting)    in the past   Past Surgical History:  Procedure Laterality Date  . BLADDER SURGERY  5-30-218  . BREAST BIOPSY  06/19/2011   Procedure: BREAST BIOPSY;  Surgeon: Rolm Bookbinder, MD;  Location: [place];  Service: General;  Laterality: Right;  Right breast chronic abscess drainage and debridement  . BREAST CYST EXCISION Right   . BREAST DUCTAL SYSTEM EXCISION Left 01/24/2014   Procedure:  LEFT BREAST DUCT EXCISION;  Surgeon: Rolm Bookbinder, MD;  Location: [place];  Service: General;  Laterality: Left;  . BREAST SURGERY  8/12   right breast biopsy  . CESAREAN SECTION     X 3  . DILATATION & CURETTAGE/HYSTEROSCOPY WITH MYOSURE N/A 05/10/2014   Procedure: DILATATION & CURETTAGE/HYSTEROSCOPY WITH MYOSURE ABLATION,RESECTOSCOPIC POLYPECTOMY;  Surgeon: Terrance Mass, MD;  Location: [facility];  Service: Gynecology;  Laterality: N/A;  Confirmed with Myosure rep to be present.  . INCISE AND DRAIN ABCESS     right breast  . IR EMBO VENOUS NOT HEMORR HEMANG  INC GUIDE ROADMAPPING  06/09/2017  . IR RADIOLOGIST EVAL & MGMT   05/04/2017  . TUBAL LIGATION  2004   Social History   Socioeconomic History  . Marital status: Married    Spouse name: Not on file  . Number of children: 3  . Years of education: Not on file  . Highest education level: Not on file  Social Needs  . Financial resource strain: Not on file  . Food insecurity - worry: Not on file  . Food insecurity - inability: Not on file  . Transportation needs - medical: Not on file  . Transportation needs - non-medical: Not on file  Occupational History  . Occupation: [employer]  7pm-4 am  Tobacco Use  . Smoking status: Never Smoker  . Smokeless tobacco: Never Used  Substance and Sexual Activity  . Alcohol use: No    Alcohol/week: 0.0 oz  . Drug use: No  . Sexual activity: Yes    Partners: Male    Birth control/protection: Surgical    Comment: 1st intercourse- 18, partners- 2, married- 72 yrs   Other Topics Concern  . Not on file  Social History Narrative   Original from [place]          Current Facility-Administered Medications on File Prior to Encounter  Medication Dose Route Frequency Provider Last Rate Last Dose  . diazepam (VALIUM) tablet 10 mg  10 mg Oral Once Jacqulynn Cadet, MD       Current Outpatient Medications on File Prior to Encounter  Medication Sig Dispense Refill  . Multiple Vitamin (MULTIVITAMIN) tablet Take 1 tablet daily by mouth.     No Known Allergies  ROS:  Review of Systems  Constitutional: Negative for chills, fatigue and fever.  Respiratory: Negative for shortness of breath.   Cardiovascular: Negative for chest pain.  Gastrointestinal: Negative for abdominal pain, nausea and vomiting.  Genitourinary: Positive for vaginal pain. Negative for difficulty urinating, dysuria, flank pain, pelvic pain, vaginal bleeding and vaginal discharge.  Neurological: Negative for dizziness and headaches.  Psychiatric/Behavioral: Negative.      I have reviewed patient's Past Medical Hx, Surgical Hx, Family Hx, Social  Hx, medications and allergies.   Physical Exam   Patient Vitals for the past 24 hrs:  BP Temp Temp src Pulse Resp SpO2  06/09/17 1740 111/61 98 F (36.7 C) Oral 81 20 100 %  06/09/17 1309 120/62 97.8 F (36.6 C) Oral (!) 57 16 98 %   Constitutional: Well-developed, well-nourished female in no acute distress.  Cardiovascular: normal rate Respiratory: normal effort GI: Abd soft, non-tender. Pos BS x 4 MS: Extremities nontender, no edema, normal ROM Neurologic: Alert and oriented x 4.  GU: Neg CVAT.  PELVIC EXAM: On visual inspection, one 0.5 cm x 0.5 cm round raised lesion with flat open wound on top with clear drainage noted on left labia minora, lesion is not painful to palpation with cotton swab   LAB RESULTS Results for orders placed or performed during the hospital encounter of 06/09/17 (from the past 24 hour(s))  Pregnancy, urine POC     Status: None   Collection Time: 06/09/17  1:38 PM  Result Value Ref Range   Preg Test, Ur NEGATIVE NEGATIVE  Urinalysis, Routine w reflex microscopic     Status: Abnormal   Collection Time: 06/09/17  1:43 PM  Result Value Ref Range   Color, Urine STRAW (A) YELLOW   APPearance CLEAR CLEAR   Specific Gravity, Urine 1.006 1.005 - 1.030   pH 6.0 5.0 - 8.0   Glucose, UA NEGATIVE NEGATIVE mg/dL   Hgb urine dipstick NEGATIVE NEGATIVE   Bilirubin Urine NEGATIVE NEGATIVE   Ketones, ur NEGATIVE NEGATIVE mg/dL   Protein, ur NEGATIVE NEGATIVE mg/dL   Nitrite NEGATIVE NEGATIVE   Leukocytes, UA NEGATIVE NEGATIVE  Wet prep, genital     Status: Abnormal   Collection Time: 06/09/17  4:34 PM  Result Value Ref Range   Yeast Wet Prep HPF POC NONE SEEN NONE SEEN   Trich, Wet Prep NONE SEEN NONE SEEN   Clue Cells Wet Prep HPF POC NONE SEEN NONE SEEN   WBC, Wet Prep HPF POC FEW (A) NONE SEEN   Sperm NONE SEEN        IMAGING   MAU Management/MDM: Lesion is c/w folliculitis/small abscess but open skin warrants HSV testing.  Discussed with pt at  time of evaluation. Wet prep GC done today.  Topical lidocaine given in MAU and Rx for use at home for pain. May use ibuprofen and or Tylenol PRN.  Sitz bath/warm compress TID.  Consult Dr Dellis Filbert with assessmetn and findings.  F/U with Dr Dellis Filbert if symptoms persist, return to the office as needed for emergencies.  Pt discharged with strict infection/pain precautions.  ASSESSMENT 1. Abscess of vagina     PLAN Discharge home Allergies as of 06/09/2017   No Known Allergies     Medication List    TAKE these medications   lidocaine 2 % jelly Commonly known as:  XYLOCAINE Apply  1 application topically as needed.   multivitamin tablet Take 1 tablet daily by mouth.      Follow-up Information    Princess Bruins, MD Follow up.   Specialty:  Obstetrics and Gynecology Why:  Make follow up appointment next week. Return to MAU as needed for gyn emergencies. Contact information: [place] [address] [phone]           Fatima Blank Certified Nurse-Midwife 06/09/2017  7:21 PM

## 2017-06-10 LAB — GC/CHLAMYDIA PROBE AMP (~~LOC~~) NOT AT ARMC
Chlamydia: NEGATIVE
Neisseria Gonorrhea: NEGATIVE

## 2017-06-11 LAB — HERPES SIMPLEX VIRUS(HSV) DNA BY PCR
HSV 1 DNA: NEGATIVE
HSV 2 DNA: NEGATIVE

## 2017-06-14 ENCOUNTER — Ambulatory Visit: Payer: BLUE CROSS/BLUE SHIELD | Admitting: Women's Health

## 2017-06-14 ENCOUNTER — Encounter: Payer: Self-pay | Admitting: Women's Health

## 2017-06-14 VITALS — BP 110/80 | Ht 62.0 in | Wt 154.0 lb

## 2017-06-14 DIAGNOSIS — R300 Dysuria: Secondary | ICD-10-CM

## 2017-06-14 NOTE — Progress Notes (Signed)
51 year old MHF G3 P3 presents with complaint of questionable pimple, sore spot on labia. Spanish-speaking, interpreter present. States was seen at [facility] on 06/09/2017, prescribed lidocaine jelly and was instructed to follow-up here. Also states is unsure of results. Reviewed wet prep, GC/Chlamydia, HSV  and UA were negative. States cleaned a sewing neele with alcohol, poked bump, mostly a bloody discharge noted and had relief of discomfort. Married, regular monthly cycle/BTL. 05/25/2017 annual exam with Dr. Dellis Filbert.  Exam: Appears well. External genitalia within normal limits, left upper labia resolving folliculitis. No visible discharge or erythema noted. UA negative  Resolving folliculitis  Plan: Apply over-the-counter Neosporin to area at bedtime until completely resolved, loose clothing. Reviewed no by mouth antibiotic is needed, negative lab results reviewed.

## 2017-06-14 NOTE — Patient Instructions (Signed)
Foliculitis (Folliculitis) La foliculitis es una inflamacin de los folculos capilares. La foliculitis ocurre con mayor frecuencia en el cuero cabelludo, los muslos, las piernas, la espalda y las nalgas. Sin embargo, puede aparecer en cualquier parte del cuerpo. CAUSAS Esta afeccin puede ser causada por lo siguiente:  Una infeccin bacteriana (frecuente).  Infecciones por hongos.  Infecciones virales.  Contacto con ciertas sustancias qumicas, especialmente aceites y alquitrn.  Rasurarse o depilarse.  Aplicacin frecuente de cremas o ungentos grasosos en la piel. La foliculitis de duracin prolongada y la foliculitis recurrente pueden ser causadas por bacterias que viven en las narinas. FACTORES DE RIESGO Es ms probable que esta afeccin se desarrolle en las personas que tengan lo siguiente:  Un sistema inmunitario debilitado.  Diabetes.  Obesidad. SNTOMAS Los sntomas de esta afeccin incluyen lo siguiente:  Enrojecimiento.  Inflamacin.  Hinchazn.  Picazn.  Pequeos puntos blancos o amarillos llenos de pus (pstulas) que pican y aparecen sobre una zona enrojecida. Si hay una infeccin que se adentra en el folculo, puede formarse un fornculo.  Un conjunto de fornculos agrupados estrechamente (carbunclo). Estos fornculos tienden a formarse en zonas del cuerpo con mucho pelo y sudorosas. DIAGNSTICO Esta afeccin se diagnostica con un examen de la piel. Para hallar la causa de la afeccin, el mdico puede tomar una muestra de uno de los fornculos o pstulas y analizarla. TRATAMIENTO El tratamiento de este trastorno puede incluir lo siguiente:  La aplicacin de compresas calientes en la zona afectada.  La administracin de antibiticos o la aplicacin de un antibitico a la piel.  La aplicacin de una solucin antisptica o darse un bao con esta solucin.  La administracin de un medicamento de venta libre para calmar la picazn.  La realizacin de un  procedimiento para drenar las pstulas o los fornculos. Se puede realizar si una pstula o un fornculo contienen mucho pus o lquido.  La extraccin del pelo con lser. Se puede realizar para tratar una foliculitis de duracin prolongada. INSTRUCCIONES PARA EL CUIDADO EN EL HOGAR  Si se lo indican, aplique calor en la zona afectada tan frecuentemente como se lo haya indicado el mdico. Use la fuente de calor que el mdico le recomiende, como una compresa de calor hmedo o una almohadilla trmica. ? Coloque una toalla entre la piel y la fuente de calor. ? Aplique el calor durante 20 a 30minutos. ? Retire la fuente de calor si la piel se le pone de color rojo brillante. Esto es muy importante si no puede sentir el dolor, el calor o el fro. Puede correr un riesgo mayor de sufrir quemaduras.  Si le recetaron un antibitico, tmelo como se lo haya indicado el mdico. No deje de usar el antibitico aunque comience a sentirse mejor.  Tome los medicamentos de venta libre y los recetados solamente como se lo haya indicado el mdico.  No rasure la piel irritada.  Concurra a todas las visitas de control como se lo haya indicado el mdico. Esto es importante. SOLICITE ATENCIN MDICA DE INMEDIATO SI:  Tiene ms enrojecimiento, hinchazn o dolor en la zona afectada.  Hay lneas rojas que se extienden desde la zona afectada.  Tiene fiebre. Esta informacin no tiene como fin reemplazar el consejo del mdico. Asegrese de hacerle al mdico cualquier pregunta que tenga. Document Released: 06/29/2005 Document Revised: 12/29/2011 Document Reviewed: 04/19/2015 Elsevier Interactive Patient Education  2018 Elsevier Inc.  

## 2017-06-15 LAB — URINALYSIS W MICROSCOPIC + REFLEX CULTURE
Bacteria, UA: NONE SEEN /HPF
Bilirubin Urine: NEGATIVE
Glucose, UA: NEGATIVE
Hyaline Cast: NONE SEEN /LPF
Ketones, ur: NEGATIVE
Leukocyte Esterase: NEGATIVE
Nitrites, Initial: NEGATIVE
Protein, ur: NEGATIVE
RBC / HPF: NONE SEEN /HPF (ref 0–2)
Specific Gravity, Urine: 1.002 (ref 1.001–1.03)
WBC, UA: NONE SEEN /HPF (ref 0–5)
pH: 6.5 (ref 5.0–8.0)

## 2017-06-15 LAB — NO CULTURE INDICATED

## 2017-06-16 ENCOUNTER — Ambulatory Visit
Admission: RE | Admit: 2017-06-16 | Discharge: 2017-06-16 | Disposition: A | Discharge status: Home / Self Care | Payer: BLUE CROSS/BLUE SHIELD | Source: Ambulatory Visit | Attending: Interventional Radiology | Admitting: Interventional Radiology

## 2017-06-16 ENCOUNTER — Other Ambulatory Visit (HOSPITAL_COMMUNITY): Payer: Self-pay | Admitting: Interventional Radiology

## 2017-06-16 DIAGNOSIS — I83812 Varicose veins of left lower extremities with pain: Secondary | ICD-10-CM

## 2017-06-16 NOTE — Progress Notes (Signed)
Patient ID: Taylor Parker, female   DOB: 05-Aug-1965, 51 y.o.   MRN: [id number]       Chief Complaint: Patient was seen in consultation today for follow-up short saphenous vein ablation treatment at the request of [place]  Referring Physician(s): McCullough,Heath  History of Present Illness: Taylor Parker is a 51 y.o. female with a history of left lower extremity superficial venous insufficiency, post ablation of left great saphenous vein approximately 5 years ago at an outside institution. She had recurrent posterior Symptoms and bulging varicose veins. Advanced superficial venous valvular insufficiency of the left small saphenous vein was identified on ultrasound evaluation 02/02/2017. She underwent technically successful transcatheter laser ablation of the left small saphenous vein under ultrasound guidance on 06/09/2017. She returns for her scheduled 1 week follow-up. She's done well post procedure. No leg tenderness in the treatment region. No leg swelling. No bleeding at the access site. No chest pain or shortness of breath. She is using her compression hose as prescribed.  Past Medical History:  Diagnosis Date  . Anxiety   . Depression    no med  . Headache(784.0)    otc med prn  . Hyperlipidemia    diet controlled, no meds  . PMDD (premenstrual dysphoric disorder)   . PONV (postoperative nausea and vomiting)    in the past    Past Surgical History:  Procedure Laterality Date  . BLADDER SURGERY  5-30-218  . BREAST BIOPSY  06/19/2011   Procedure: BREAST BIOPSY;  Surgeon: Rolm Bookbinder, MD;  Location: [place];  Service: General;  Laterality: Right;  Right breast chronic abscess drainage and debridement  . BREAST CYST EXCISION Right   . BREAST DUCTAL SYSTEM EXCISION Left 01/24/2014   Procedure:  LEFT BREAST DUCT EXCISION;  Surgeon: Rolm Bookbinder, MD;  Location: [place];  Service: General;  Laterality: Left;  . BREAST SURGERY   8/12   right breast biopsy  . CESAREAN SECTION     X 3  . DILATATION & CURETTAGE/HYSTEROSCOPY WITH MYOSURE N/A 05/10/2014   Procedure: DILATATION & CURETTAGE/HYSTEROSCOPY WITH MYOSURE ABLATION,RESECTOSCOPIC POLYPECTOMY;  Surgeon: Terrance Mass, MD;  Location: [facility];  Service: Gynecology;  Laterality: N/A;  Confirmed with Myosure rep to be present.  . INCISE AND DRAIN ABCESS     right breast  . IR EMBO VENOUS NOT HEMORR HEMANG  INC GUIDE ROADMAPPING  06/09/2017  . IR RADIOLOGIST EVAL & MGMT  05/04/2017  . TUBAL LIGATION  2004    Allergies: Patient has no known allergies.  Medications: Prior to Admission medications   Medication Sig Start Date End Date Taking? Authorizing Provider  lidocaine (XYLOCAINE) 2 % jelly Apply 1 application topically as needed. 06/09/17   Leftwich-Kirby, Kathie Dike, CNM  Multiple Vitamin (MULTIVITAMIN) tablet Take 1 tablet daily by mouth.    [provider]     Family History  Problem Relation Age of Onset  . Heart disease Mother   . Diabetes Other        gm  . High Cholesterol Brother        high TG  . Colon cancer Neg Hx   . Breast cancer Neg Hx   . Esophageal cancer Neg Hx   . Stomach cancer Neg Hx   . Rectal cancer Neg Hx     Social History   Socioeconomic History  . Marital status: Married    Spouse name: Not on file  . Number of children: 3  . Years of education: Not on  file  . Highest education level: Not on file  Social Needs  . Financial resource strain: Not on file  . Food insecurity - worry: Not on file  . Food insecurity - inability: Not on file  . Transportation needs - medical: Not on file  . Transportation needs - non-medical: Not on file  Occupational History  . Occupation: [employer]  7pm-4 am  Tobacco Use  . Smoking status: Never Smoker  . Smokeless tobacco: Never Used  Substance and Sexual Activity  . Alcohol use: No    Alcohol/week: 0.0 oz  . Drug use: No  . Sexual activity: Yes    Partners: Male     Birth control/protection: Surgical    Comment: 1st intercourse- 18, partners- 2, married- 88 yrs   Other Topics Concern  . Not on file  Social History Narrative   Original from [place]           ECOG Status: 1 - Symptomatic but completely ambulatory  Review of Systems: A 12 point ROS discussed and pertinent positives are indicated in the HPI above.  All other systems are negative.  Review of Systems  Vital Signs: LMP 05/25/2017   Physical Exam  Mallampati Score:     Imaging: US Venous Img Lower Unilateral Left  Result Date: 06/16/2017 CLINICAL DATA:  Left lower extremity small saphenous venous insufficiency, post laser ablation therapy 06/09/2017. EXAM: LEFT LOWER EXTREMITY VENOUS DUPLEX ULTRASOUND TECHNIQUE: Gray-scale sonography with graded compression, as well as color Doppler and duplex ultrasound, were performed to evaluate the deep and superficial veins of left lower extremity. Spectral Doppler was utilized to evaluate flow at rest and with distal augmentation maneuvers. A complete superficial venous insufficiency exam was performed in the upright standing position. I personally performed the technical portion of the exam. COMPARISON:  06/09/2017 and previous FINDINGS: Deep Venous System: Evaluation of the deep venous system including the common femoral, femoral, profunda femoral, popliteal and calf veins (where visible) demonstrates partially occlusive noncompressible thrombus in the popliteal vein extending into the distal femoral vein. More central femoral vein as well as deep femoral vein, saphenous femoral junction and common femoral vein remain widely patent. Deep veins of the calf were not evaluated. Superficial Venous System: LEFT SPJ: Not visualized SSV Prox: Occluded SSV Mid: Occluded SSV Distal: Patent, 2 mm diameter, compressible. Other: Compressible patent varicose veins of the posterior mid calf IMPRESSION: 1. The short saphenous vein is occluded through its treatment  length, with patency of associated varicose veins. 2. Acute partially occlusive DVT in the popliteal and distal femoral veins. This is likely related to recent treatment. Patient was started on anticoagulation therapy, follow-up in 2 weeks to exclude any central propagation. She knows to telephone or present to the ER in the interval with any leg pain, swelling, shortness of breath, or chest pain. Electronically Signed   By: Lucrezia Europe M.D.   On: 06/16/2017 11:10   [place] Rad Eval And Mgmt  Result Date: 06/16/2017 Please refer to "Notes" to see consult details.  Mm Screening Breast Tomo Bilateral  Result Date: 05/27/2017 CLINICAL DATA:  Screening. EXAM: 2D DIGITAL SCREENING BILATERAL MAMMOGRAM WITH CAD AND ADJUNCT TOMO COMPARISON:  Previous exam(s). ACR Breast Density Category c: The breast tissue is heterogeneously dense, which may obscure small masses. FINDINGS: There are no findings suspicious for malignancy. Images were processed with CAD. IMPRESSION: No mammographic evidence of malignancy. A result letter of this screening mammogram will be mailed directly to the patient. RECOMMENDATION: Screening mammogram  in one year. (Code:SM-B-01Y) BI-RADS CATEGORY  1: Negative. Electronically Signed   By: Fidela Salisbury M.D.   On: 05/27/2017 13:21   Ir Embo Venous Not [place] Guide Roadmapping  Result Date: 06/09/2017 INDICATION: 51 year old female with a history of left lower extremity superficial venous insufficiency involving the left small saphenous vein. EXAM: ENDOVASCULAR LASER ABLATION THERAPY OF THE INCOMPETENT GREAT SAPHENOUS VEIN; FIRST VEIN TREATED CPT: [phone] Add [phone] for each additional vein treated through a separate access site during the same session. MEDICATIONS: None. ANESTHESIA/SEDATION: 10 mg valium given PO 30 minutes prior to the procedure for anxiolysis. COMPLICATIONS: None immediate. PROCEDURE: Informed consent was obtained from the patient following explanation of the  procedure, risks, benefits and alternatives. The patient understands, agrees and consents for the procedure. All questions were addressed. A time out was performed. Maximal barrier sterile technique utilized including caps, mask, sterile gowns, sterile gloves, large sterile drape, hand hygiene, and chlorhexidine skin prep. The patient was initially placed in a reverse Trendelenburg position. The left lower extremity superficial venous system was evaluated with ultrasound. The left small saphenous vein was identified and the vein path was marked on the skin. The leg and groin were prepped with chlorhexidine and a sterile drape was placed. The skin at the planned venous access site was anesthetized with 1% lidocaine. A 21 gauge needle was directed into the vein with ultrasound guidance and a micropuncture dilator set was placed. A 0.035 wire was advanced to the saphenofemoral junction with ultrasound guidance. A 7 cm sheath was placed over the wire and positioned 2 cm peripheral to the saphenopopliteal junction. A 60 cm AngioDynamics laser fiber was then advanced through the sheath and locked into place. The tip of the laser fiber was visualized under [place] and positioned 2 cm peripheral to the saphenofemoral junction. Position was documented with an [place] image. Dilute lidocaine was used as a tumescent. The small saphenous vein vein from the percutaneous entry site to the saphenofemoral junction was treated with 50 ml of the tumescent. The laser treatment was performed with 253.4 joules over 42 seconds. The sheath and laser were removed as a single entity. Manual compression was placed over the percutaneous entry site. Dressings were placed over the puncture sites and the leg was placed in a compression stocking. FINDINGS: The laser fiber was positioned at least 2 cm from the saphenofemoral junction. IMPRESSION: Successful transcatheter laser ablation of the left small saphenous vein with ultrasound guidance. PLAN: 1.  Patient will return for clinical follow-up in 1 week. 2. Adjunctive sclerotherapy will be performed at the 1 month follow up if clinically warranted. Signed, Criselda Peaches, MD [facility] Radiology Electronically Signed   By: Jacqulynn Cadet M.D.   On: 06/09/2017 10:51    Labs:  CBC: Recent Labs    10/05/16 1731 04/04/17 1157  WBC 9.1 9.0  HGB 13.3 14.2  HCT 39.3 40.6  PLT 229 210    COAGS: No results for input(s): INR, APTT in the last 8760 hours.  BMP: Recent Labs    10/05/16 1731 10/16/16 1204 04/04/17 1157  NA 138 139 137  K 3.7 3.9 3.5  CL 108 107 105  CO2 23 26 22   GLUCOSE 100* 100* 105*  BUN 9 15 10   CALCIUM 8.5* 9.1 8.7*  CREATININE 0.73 0.66 0.66  GFRNONAA >60  --  >60  GFRAA >60  --  >60    LIVER FUNCTION TESTS: Recent Labs  10/05/16 1731 04/04/17 1157 04/12/17 0955  BILITOT 0.8 0.9  --   AST 14* 21  --   ALT 14 21  --   ALKPHOS 67 70  --   PROT 6.8 6.9 6.6  ALBUMIN 3.6 3.7  --     TUMOR MARKERS: No results for input(s): AFPTM, CEA, CA199, CHROMGRNA in the last 8760 hours.  Assessment and Plan:  My impression is that the patient has clinically done well post transcatheter laser ablation of the left short saphenous vein. The treated segment of the SSV is closed. Patent varicose veins in the posterior calf are still evident. However, ultrasound demonstrates development of an acute partially occlusive DVT in the left popliteal and distal femoral veins. This is currently clinically asymptomatic. I discussed the findings with the patient. I discussed the importance of anticoagulation to prevent propagation of this DVT. We'll start her on Eliquis by mouth today. She will continue with her compression hose. She will continue with her exercise regimen.  We'll see her back in 2 weeks for follow-up ultrasound to confirm stability or resolution of the DVT. She was instructed to telephone [place] or proceed  to the ER in the event of chest pain, shortness of breath, new left leg swelling or pain. She seemed to understand and was agreeable with the plan. She had her questions answered. Findings and plan were communicated to Dr. Laurence Ferrari.   Thank you for this interesting consult.  I greatly enjoyed meeting Taylor Parker and look forward to participating in their care.  A copy of this report was sent to the requesting provider on this date.  Electronically Signed: Rickard Rhymes 06/16/2017, 11:44 AM   I spent a total of    25 Minutes in face to face in clinical consultation, greater than 50% of which was counseling/coordinating care for acute DVT post venous ablation procedure.

## 2017-06-18 ENCOUNTER — Telehealth: Payer: Self-pay | Admitting: Student

## 2017-06-18 ENCOUNTER — Ambulatory Visit (INDEPENDENT_AMBULATORY_CARE_PROVIDER_SITE_OTHER): Payer: BLUE CROSS/BLUE SHIELD | Admitting: Internal Medicine

## 2017-06-18 ENCOUNTER — Encounter: Payer: Self-pay | Admitting: Internal Medicine

## 2017-06-18 VITALS — BP 118/68 | HR 55 | Temp 97.8°F | Resp 14 | Ht 62.0 in | Wt 156.4 lb

## 2017-06-18 DIAGNOSIS — Z0000 Encounter for general adult medical examination without abnormal findings: Secondary | ICD-10-CM | POA: Insufficient documentation

## 2017-06-18 DIAGNOSIS — Z23 Encounter for immunization: Secondary | ICD-10-CM

## 2017-06-18 DIAGNOSIS — F419 Anxiety disorder, unspecified: Secondary | ICD-10-CM

## 2017-06-18 DIAGNOSIS — Z114 Encounter for screening for human immunodeficiency virus [HIV]: Secondary | ICD-10-CM

## 2017-06-18 DIAGNOSIS — F329 Major depressive disorder, single episode, unspecified: Secondary | ICD-10-CM

## 2017-06-18 DIAGNOSIS — F32A Depression, unspecified: Secondary | ICD-10-CM

## 2017-06-18 MED ORDER — ESCITALOPRAM OXALATE 10 MG PO TABS: 10.0000 mg | ORAL_TABLET | Freq: Every day | ORAL | 1 refills | Status: DC

## 2017-06-18 NOTE — Assessment & Plan Note (Signed)
-   Td 06-2017; Flu shot today -female care: sees gyn; MMG 05-27-2017 -CCS: 2017 -Labs: High cholesterol, HIV -Diet and exercise discussed.

## 2017-06-18 NOTE — Progress Notes (Signed)
Pre visit review using our clinic review tool, if applicable. No additional management support is needed unless otherwise documented below in the visit note. 

## 2017-06-18 NOTE — Progress Notes (Signed)
Subjective:    Patient ID: Taylor Parker, female    DOB: Jun 04, 1966, 51 y.o.   MRN: [id number]  DOS:  06/18/2017 Type of visit - description : cpx Interval history: Since the last visit, saw rheumatology, notes reviewed. Arthralgias are overall much improved. History of depression and anxiety: I noticed the patient was very stressed, I asked her about it, she started to cry, we had a long conversation her family issues. Her sx include: Anxiety, depression, easily irritable, very little patience with others.    When asked, admits to occasional suicidal ideas, states she would never do that because her family needs her.  Review of Systems  Other than above, a 14 point review of systems is negative     Past Medical History:  Diagnosis Date  . Anxiety   . Depression    no med  . Headache(784.0)    otc med prn  . Hyperlipidemia    diet controlled, no meds  . PMDD (premenstrual dysphoric disorder)   . PONV (postoperative nausea and vomiting)    in the past    Past Surgical History:  Procedure Laterality Date  . BLADDER SURGERY  5-30-218  . BREAST BIOPSY  06/19/2011   Procedure: BREAST BIOPSY;  Surgeon: Rolm Bookbinder, MD;  Location: [facility];  Service: General;  Laterality: Right;  Right breast chronic abscess drainage and debridement  . BREAST CYST EXCISION Right   . BREAST DUCTAL SYSTEM EXCISION Left 01/24/2014   Procedure:  LEFT BREAST DUCT EXCISION;  Surgeon: Rolm Bookbinder, MD;  Location: [facility];  Service: General;  Laterality: Left;  . BREAST SURGERY  8/12   right breast biopsy  . CESAREAN SECTION     X 3  . DILATATION & CURETTAGE/HYSTEROSCOPY WITH MYOSURE N/A 05/10/2014   Procedure: DILATATION & CURETTAGE/HYSTEROSCOPY WITH MYOSURE ABLATION,RESECTOSCOPIC POLYPECTOMY;  Surgeon: Terrance Mass, MD;  Location: [facility];  Service: Gynecology;  Laterality: N/A;  Confirmed with [employer] rep to be present.  . INCISE AND DRAIN ABCESS     right  breast  . IR EMBO VENOUS NOT HEMORR HEMANG  INC GUIDE ROADMAPPING  06/09/2017  . IR RADIOLOGIST EVAL & MGMT  05/04/2017  . TUBAL LIGATION  2004    Social History   Socioeconomic History  . Marital status: Married    Spouse name: Not on file  . Number of children: 3  . Years of education: Not on file  . Highest education level: Not on file  Social Needs  . Financial resource strain: Not on file  . Food insecurity - worry: Not on file  . Food insecurity - inability: Not on file  . Transportation needs - medical: Not on file  . Transportation needs - non-medical: Not on file  Occupational History  . Occupation: [employer]  7pm-4 am  Tobacco Use  . Smoking status: Never Smoker  . Smokeless tobacco: Never Used  Substance and Sexual Activity  . Alcohol use: No    Alcohol/week: 0.0 oz  . Drug use: No  . Sexual activity: Yes    Partners: Male    Birth control/protection: Surgical    Comment: 1st intercourse- 18, partners- 2, married- 60 yrs   Other Topics Concern  . Not on file  Social History Narrative   Original from [place]   [place]: pt, husband, son and G-son          Allergies as of 06/18/2017   No Known Allergies     Medication List  Accurate as of 06/18/17 11:59 PM. Always use your most recent med list.          escitalopram 10 MG tablet Commonly known as:  LEXAPRO Take 1 tablet (10 mg total) by mouth daily.   multivitamin tablet Take 1 tablet daily by mouth.          Objective:   Physical Exam BP 118/68 (BP Location: Left Arm, Patient Position: Sitting, Cuff Size: Small)   Pulse (!) 55   Temp 97.8 F (36.6 C) (Oral)   Resp 14   Ht 5\' 2"  (1.575 m)   Wt 156 lb 6 oz (70.9 kg)   LMP 05/25/2017   SpO2 97%   BMI 28.60 kg/m  General:   Well developed, well nourished, + emotional distress when we talk about her daughter and son Neck: No  thyromegaly  HEENT:  Normocephalic . Face symmetric, atraumatic Lungs:  CTA B Normal respiratory  effort, no intercostal retractions, no accessory muscle use. Heart: RRR,  no murmur.  No pretibial edema bilaterally  Abdomen:  Not distended, soft, non-tender. No rebound or rigidity.   Skin: Exposed areas without rash. Not pale. Not jaundice Neurologic:  alert & oriented X3.  Speech normal, gait appropriate for age and unassisted Strength symmetric and appropriate for age.  Psych: Cognition and judgment appear intact.  Cooperative with normal attention span and concentration.  Behavior appropriate. Tearful.     Assessment & Plan:   Assessment   Dyslipidemia, high triglycerides Depression  , depression Headache: CT 11/2015 negative + ANA 1:640 nucleolar, C3-C4 normal, ENA negative: saw rheumatology 2018 Chronic lower extremity edema: H/o DVT, s/p coumadin 2003 during pregnancy  Varicose Veins: pt reports ablation L ~ 2014, again 2018 BTL  PLAN: Dyslipidemia: Labs today Anxiety depression: Long h/o such, currently on no  treatment.  We had a long conversation about it, her son and daughter have a number of problems, pt admits to occasional suicidal ideas but states she would never do that because her family needs her.  Recommend to see a counselor who speaks Spanish, information provided.  I offered her medication and she agreed.  Trial with Lexapro, strict precautions discussed regarding suicidal ideas.  Reassess in 4 weeks + ANA: She saw rheumatology, DX with DJD, + ANA was not associated with autoimmune sxs.  They are planning to recheck on her by 5/19. RTC 4 weeks  Today, I spent more than  20  min with the patient: >50% of the time counseling regards anxiety, depression, listening to her concerns and worries.  Discussing pros and cons of medications

## 2017-06-18 NOTE — Patient Instructions (Signed)
GO TO THE LAB : Get the blood work     GO TO THE FRONT DESK Schedule your next appointment for a  Follow up in 4 weeks  TOME LEXAPRO 1 TABLET A DAY  LE AYUDARA CON LA DEPRESION  TOMELA ANTES DE DORMIR  LLAME SI SE PONE PEOR [place]

## 2017-06-18 NOTE — Telephone Encounter (Signed)
PA notified [facility] at [facility] requesting new prescription for anticoagluation.  States she is unable to afford Eliquis.  Discussed with Dr. Laurence Ferrari who would like for the patient to start lovenox.   Lovenox 80 mg twice daily injections were called into patient's preferred pharmacy.   Spoke with [name] to inform her medication was being called into pharmacy.  She verbalizes understanding.   Brynda Greathouse, MS RD PA-C 5:02 PM

## 2017-06-19 LAB — LIPID PANEL
Cholesterol: 254 mg/dL — ABNORMAL HIGH (ref <200)
HDL: 68 mg/dL (ref >50)
LDL Cholesterol (Calc): 162 mg/dL (calc) — ABNORMAL HIGH
Non-HDL Cholesterol (Calc): 186 mg/dL (calc) — ABNORMAL HIGH (ref <130)
Total CHOL/HDL Ratio: 3.7 (calc) (ref <5.0)
Triglycerides: 122 mg/dL (ref <150)

## 2017-06-19 LAB — HIV ANTIBODY (ROUTINE TESTING W REFLEX): HIV 1&2 Ab, 4th Generation: NONREACTIVE

## 2017-06-20 DIAGNOSIS — F329 Major depressive disorder, single episode, unspecified: Secondary | ICD-10-CM | POA: Insufficient documentation

## 2017-06-20 DIAGNOSIS — F32A Depression, unspecified: Secondary | ICD-10-CM | POA: Insufficient documentation

## 2017-06-20 DIAGNOSIS — F419 Anxiety disorder, unspecified: Secondary | ICD-10-CM | POA: Insufficient documentation

## 2017-06-20 NOTE — Assessment & Plan Note (Signed)
Dyslipidemia: Labs today Anxiety depression: Long h/o such, currently on no  treatment.  We had a long conversation about it, her son and daughter have a number of problems, pt admits to occasional suicidal ideas but states she would never do that because her family needs her.  Recommend to see a counselor who speaks Spanish, information provided.  I offered her medication and she agreed.  Trial with Lexapro, strict precautions discussed regarding suicidal ideas.  Reassess in 4 weeks + ANA: She saw rheumatology, DX with DJD, + ANA was not associated with autoimmune sxs.  They are planning to recheck on her by 5/19. RTC 4 weeks

## 2017-06-29 ENCOUNTER — Ambulatory Visit
Admission: RE | Admit: 2017-06-29 | Discharge: 2017-06-29 | Disposition: A | Discharge status: Home / Self Care | Payer: BLUE CROSS/BLUE SHIELD | Source: Ambulatory Visit | Attending: Interventional Radiology | Admitting: Interventional Radiology

## 2017-06-29 ENCOUNTER — Encounter: Payer: Self-pay | Admitting: Radiology

## 2017-06-29 DIAGNOSIS — I83812 Varicose veins of left lower extremities with pain: Secondary | ICD-10-CM

## 2017-06-29 HISTORY — PX: IR RADIOLOGIST EVAL & MGMT: IMG5224

## 2017-06-29 NOTE — Progress Notes (Signed)
Chief Complaint: Patient was seen in follow up today for post EVLT EHIT (Class 3) at the request of [facility]  Referring Physician(s): McCullough,Heath  History of Present Illness: Taylor Parker is a 51 y.o. female with a history of left lower extremity symptomatic superficial venous insufficiency status post endovenous thermal ablation of the left great saphenous vein 5 years ago at an outside institution, and more recent endovenous laser ablation therapy of the left small saphenous vein on 06/09/2017.  At her one week follow-up procedure she had evidence of endovenous heat-induced thrombus formation (class III). She was asymptomatic but started on anti-coagulation with weight-based Lovenox therapy. She presents today for further follow-up.  She is completely asymptomatic. She has experienced complete resolution of her left lower extremity venous insufficiency symptoms. She denies further swelling, pain, heaviness or tiredness in the leg. She has been taking her Lovenox as directed.  Past Medical History:  Diagnosis Date  . Anxiety   . Depression    no med  . Headache(784.0)    otc med prn  . Hyperlipidemia    diet controlled, no meds  . PMDD (premenstrual dysphoric disorder)   . PONV (postoperative nausea and vomiting)    in the past    Past Surgical History:  Procedure Laterality Date  . BLADDER SURGERY  5-30-218  . BREAST BIOPSY  06/19/2011   Procedure: BREAST BIOPSY;  Surgeon: Rolm Bookbinder, MD;  Location: [facility];  Service: General;  Laterality: Right;  Right breast chronic abscess drainage and debridement  . BREAST CYST EXCISION Right   . BREAST DUCTAL SYSTEM EXCISION Left 01/24/2014   Procedure:  LEFT BREAST DUCT EXCISION;  Surgeon: Rolm Bookbinder, MD;  Location: [facility];  Service: General;  Laterality: Left;  . BREAST SURGERY  8/12   right breast biopsy  . CESAREAN SECTION     X 3  . DILATATION & CURETTAGE/HYSTEROSCOPY  WITH MYOSURE N/A 05/10/2014   Procedure: DILATATION & CURETTAGE/HYSTEROSCOPY WITH MYOSURE ABLATION,RESECTOSCOPIC POLYPECTOMY;  Surgeon: Terrance Mass, MD;  Location: [facility];  Service: Gynecology;  Laterality: N/A;  Confirmed with [employer] rep to be present.  . INCISE AND DRAIN ABCESS     right breast  . IR EMBO VENOUS NOT HEMORR HEMANG  INC GUIDE ROADMAPPING  06/09/2017  . IR RADIOLOGIST EVAL & MGMT  05/04/2017  . IR RADIOLOGIST EVAL & MGMT  06/29/2017  . TUBAL LIGATION  2004    Allergies: Patient has no known allergies.  Medications: Prior to Admission medications   Medication Sig Start Date End Date Taking? Authorizing Provider  escitalopram (LEXAPRO) 10 MG tablet Take 1 tablet (10 mg total) by mouth daily. 06/18/17   Colon Branch, MD  Multiple Vitamin (MULTIVITAMIN) tablet Take 1 tablet daily by mouth.    [provider]     Family History  Problem Relation Age of Onset  . Heart disease Mother   . Diabetes Other        gm  . High Cholesterol Brother        high TG  . Colon cancer Neg Hx   . Breast cancer Neg Hx   . Esophageal cancer Neg Hx   . Stomach cancer Neg Hx   . Rectal cancer Neg Hx     Social History   Socioeconomic History  . Marital status: Married    Spouse name: Not on file  . Number of children: 3  . Years of education: Not on file  . Highest education  level: Not on file  Social Needs  . Financial resource strain: Not on file  . Food insecurity - worry: Not on file  . Food insecurity - inability: Not on file  . Transportation needs - medical: Not on file  . Transportation needs - non-medical: Not on file  Occupational History  . Occupation: [employer]  7pm-4 am  Tobacco Use  . Smoking status: Never Smoker  . Smokeless tobacco: Never Used  Substance and Sexual Activity  . Alcohol use: No    Alcohol/week: 0.0 oz  . Drug use: No  . Sexual activity: Yes    Partners: Male    Birth control/protection: Surgical    Comment: 1st intercourse-  18, partners- 2, married- 81 yrs   Other Topics Concern  . Not on file  Social History Narrative   Original from [place]   [place]: pt, husband, son and G-son        Review of Systems: A 12 point ROS discussed and pertinent positives are indicated in the HPI above.  All other systems are negative.  Review of Systems  Vital Signs: There were no vitals taken for this visit.  Physical Exam  Constitutional: She is oriented to person, place, and time. She appears well-developed and well-nourished. No distress.  HENT:  Head: Normocephalic and atraumatic.  Eyes: No scleral icterus.  Cardiovascular: Normal rate.  Pulmonary/Chest: Effort normal.  Musculoskeletal:  No edema, erythema or skin changes.  Previously noted bulging varicosities are no longer evident.   Neurological: She is oriented to person, place, and time.  Skin: Skin is warm and dry.  Psychiatric: She has a normal mood and affect. Her behavior is normal.    Imaging: US Venous Img Lower Unilateral Left  Result Date: 06/29/2017 CLINICAL DATA:  51 year old female with a history of left lower extremity superficial venous insufficiency now status post endo venous laser ablation of the left small saphenous vein. Her EVLT was complicated by endo venous heat induced thrombus (EHIT) formation. She was began on Lovenox and presents today for follow-up evaluation to evaluate for thrombus propagation. EXAM: Left LOWER EXTREMITY VENOUS DUPLEX ULTRASOUND TECHNIQUE: Gray-scale sonography with graded compression, as well as color Doppler and duplex ultrasound, were performed to evaluate the deep and superficial veins of the left lower extremity. Spectral Doppler was utilized to evaluate flow at rest and with distal augmentation maneuvers. A complete superficial venous insufficiency exam was performed reverse Trendelenburg position. I personally supervised the technical portion of the exam. COMPARISON:  Prior duplex ultrasound evaluation  06/16/2017 FINDINGS: Deep Venous System: Evaluation of the deep venous system including the common femoral, femoral, profunda femoral, popliteal and calf veins (where visible) demonstrate no evidence of deep venous thrombosis. The vessels are compressible and demonstrate normal respiratory phasicity and response to augmentation. No evidence of deep venous reflux. Superficial Venous System: SPJ: Occluded. Minimal protrusion of the thrombus toward the popliteal vein. Extension into the popliteal vein has resolved. SSV Prox: Occluded SSV Mid: Occluded SSV Distal: Patent. Other: Some small residual superficial venous varicosities remain patent in the posterior calf. IMPRESSION: 1. Interval improvement in endovenous heat induced thrombus from Class 3 to Class 1. 2. No evidence of new deep venous thrombosis. 3. Successful occlusion of the small saphenous vein. 4. Small residual patent venous varicosities in the posterior calf. Signed, Criselda Peaches, MD [facility] Radiology Electronically Signed   By: Jacqulynn Cadet M.D.   On: 06/29/2017 14:46   US Venous Img Lower  Unilateral Left  Result Date: 06/16/2017 CLINICAL DATA:  Left lower extremity small saphenous venous insufficiency, post laser ablation therapy 06/09/2017. EXAM: LEFT LOWER EXTREMITY VENOUS DUPLEX ULTRASOUND TECHNIQUE: Gray-scale sonography with graded compression, as well as color Doppler and duplex ultrasound, were performed to evaluate the deep and superficial veins of left lower extremity. Spectral Doppler was utilized to evaluate flow at rest and with distal augmentation maneuvers. A complete superficial venous insufficiency exam was performed in the upright standing position. I personally performed the technical portion of the exam. COMPARISON:  06/09/2017 and previous FINDINGS: Deep Venous System: Evaluation of the deep venous system including the common femoral, femoral, profunda femoral,  popliteal and calf veins (where visible) demonstrates partially occlusive noncompressible thrombus in the popliteal vein extending into the distal femoral vein. More central femoral vein as well as deep femoral vein, saphenous femoral junction and common femoral vein remain widely patent. Deep veins of the calf were not evaluated. Superficial Venous System: LEFT SPJ: Not visualized SSV Prox: Occluded SSV Mid: Occluded SSV Distal: Patent, 2 mm diameter, compressible. Other: Compressible patent varicose veins of the posterior mid calf IMPRESSION: 1. The short saphenous vein is occluded through its treatment length, with patency of associated varicose veins. 2. Acute partially occlusive DVT in the popliteal and distal femoral veins. This is likely related to recent treatment. Patient was started on anticoagulation therapy, follow-up in 2 weeks to exclude any central propagation. She knows to telephone or present to the ER in the interval with any leg pain, swelling, shortness of breath, or chest pain. Electronically Signed   By: Lucrezia Europe M.D.   On: 06/16/2017 11:10   [place] Rad Eval And Mgmt  Result Date: 06/16/2017 Please refer to "Notes" to see consult details.  Ir Embo Venous Not [place] Guide Roadmapping  Result Date: 06/09/2017 INDICATION: 51 year old female with a history of left lower extremity superficial venous insufficiency involving the left small saphenous vein. EXAM: ENDOVASCULAR LASER ABLATION THERAPY OF THE INCOMPETENT GREAT SAPHENOUS VEIN; FIRST VEIN TREATED CPT: [phone] Add [phone] for each additional vein treated through a separate access site during the same session. MEDICATIONS: None. ANESTHESIA/SEDATION: 10 mg valium given PO 30 minutes prior to the procedure for anxiolysis. COMPLICATIONS: None immediate. PROCEDURE: Informed consent was obtained from the patient following explanation of the procedure, risks, benefits and alternatives. The patient understands, agrees and consents for  the procedure. All questions were addressed. A time out was performed. Maximal barrier sterile technique utilized including caps, mask, sterile gowns, sterile gloves, large sterile drape, hand hygiene, and chlorhexidine skin prep. The patient was initially placed in a reverse Trendelenburg position. The left lower extremity superficial venous system was evaluated with ultrasound. The left small saphenous vein was identified and the vein path was marked on the skin. The leg and groin were prepped with chlorhexidine and a sterile drape was placed. The skin at the planned venous access site was anesthetized with 1% lidocaine. A 21 gauge needle was directed into the vein with ultrasound guidance and a micropuncture dilator set was placed. A 0.035 wire was advanced to the saphenofemoral junction with ultrasound guidance. A 7 cm sheath was placed over the wire and positioned 2 cm peripheral to the saphenopopliteal junction. A 60 cm AngioDynamics laser fiber was then advanced through the sheath and locked into place. The tip of the laser fiber was visualized under [place] and positioned 2 cm peripheral to the saphenofemoral junction. Position was documented with an [place] image. Dilute lidocaine  was used as a tumescent. The small saphenous vein vein from the percutaneous entry site to the saphenofemoral junction was treated with 50 ml of the tumescent. The laser treatment was performed with 253.4 joules over 42 seconds. The sheath and laser were removed as a single entity. Manual compression was placed over the percutaneous entry site. Dressings were placed over the puncture sites and the leg was placed in a compression stocking. FINDINGS: The laser fiber was positioned at least 2 cm from the saphenofemoral junction. IMPRESSION: Successful transcatheter laser ablation of the left small saphenous vein with ultrasound guidance. PLAN: 1. Patient will return for clinical follow-up in 1 week. 2. Adjunctive sclerotherapy will be  performed at the 1 month follow up if clinically warranted. Signed, Criselda Peaches, MD [facility] Electronically Signed   By: Jacqulynn Cadet M.D.   On: 06/09/2017 10:51   Ir Radiologist Eval & Mgmt  Result Date: 06/29/2017 Please refer to notes tab for details about interventional procedure. (Op Note)   Labs:  CBC: Recent Labs    10/05/16 1731 04/04/17 1157  WBC 9.1 9.0  HGB 13.3 14.2  HCT 39.3 40.6  PLT 229 210    COAGS: No results for input(s): INR, APTT in the last 8760 hours.  BMP: Recent Labs    10/05/16 1731 10/16/16 1204 04/04/17 1157  NA 138 139 137  K 3.7 3.9 3.5  CL 108 107 105  CO2 23 26 22   GLUCOSE 100* 100* 105*  BUN 9 15 10   CALCIUM 8.5* 9.1 8.7*  CREATININE 0.73 0.66 0.66  GFRNONAA >60  --  >60  GFRAA >60  --  >60    LIVER FUNCTION TESTS: Recent Labs    10/05/16 1731 04/04/17 1157 04/12/17 0955  BILITOT 0.8 0.9  --   AST 14* 21  --   ALT 14 21  --   ALKPHOS 67 70  --   PROT 6.8 6.9 6.6  ALBUMIN 3.6 3.7  --     TUMOR MARKERS: No results for input(s): AFPTM, CEA, CA199, CHROMGRNA in the last 8760 hours.  Assessment and Plan:  Patient doing very well clinically. Her endovenous heat-induced thrombus has almost completely resolved and is now only a class I. Her symptomatic superficial venous insufficiency is completely resolved.  1.) Continue taking the remainder of her Lovenox which should be for another 1-2 weeks.  2.) Return to clinic in early January for follow-up ultrasound and planned percutaneous sclerotherapy of the residual venous varicosities in the calf.   Electronically Signed: Jacqulynn Cadet 06/29/2017, 3:13 PM   I spent a total of   10 Minutes in face to face in clinical consultation, greater than 50% of which was counseling/coordinating care for EHIT

## 2017-06-30 ENCOUNTER — Other Ambulatory Visit: Payer: BLUE CROSS/BLUE SHIELD

## 2017-07-19 ENCOUNTER — Encounter: Payer: Self-pay | Admitting: Internal Medicine

## 2017-07-19 ENCOUNTER — Ambulatory Visit: Payer: BLUE CROSS/BLUE SHIELD | Admitting: Internal Medicine

## 2017-07-19 VITALS — BP 118/80 | HR 55 | Temp 97.8°F | Resp 14 | Ht 62.0 in | Wt 155.5 lb

## 2017-07-19 DIAGNOSIS — F419 Anxiety disorder, unspecified: Secondary | ICD-10-CM | POA: Diagnosis not present

## 2017-07-19 DIAGNOSIS — F32A Depression, unspecified: Secondary | ICD-10-CM

## 2017-07-19 DIAGNOSIS — F329 Major depressive disorder, single episode, unspecified: Secondary | ICD-10-CM | POA: Diagnosis not present

## 2017-07-19 MED ORDER — TRAZODONE HCL 50 MG PO TABS: 25.0000 mg | ORAL_TABLET | Freq: Every evening | ORAL | 1 refills | Status: DC | PRN

## 2017-07-19 NOTE — Patient Instructions (Addendum)
  GO TO THE FRONT DESK Schedule your next appointment for a  Physical exam by 06-2018   TOME TRAZADONE 1 PASTILLA MEDIA HORA ANTES DE DORMIR  LLAME SI ESO NO LE AYUDA LLAME PARA UN REFILL SI LA PASTILLA LE ACOMODA

## 2017-07-19 NOTE — Progress Notes (Signed)
Subjective:    Patient ID: Taylor Parker, female    DOB: 07/02/1966, 52 y.o.   MRN: [id number]  DOS:  07/19/2017 Type of visit - description : f/u Interval history: See last visit, was seen with anxiety, depression, insomnia ;  tried Lexapro one time,  developed headache, nausea vomiting, did not tried again Did not seek counseling Continue with anxiety depression but symptoms are not as bad.  Did request today something to help her sleep.   Review of Systems No suicidal ideas  Past Medical History:  Diagnosis Date  . Anxiety   . Depression    no med  . Headache(784.0)    otc med prn  . Hyperlipidemia    diet controlled, no meds  . PMDD (premenstrual dysphoric disorder)   . PONV (postoperative nausea and vomiting)    in the past    Past Surgical History:  Procedure Laterality Date  . BLADDER SURGERY  5-30-218  . BREAST BIOPSY  06/19/2011   Procedure: BREAST BIOPSY;  Surgeon: Rolm Bookbinder, MD;  Location: [place];  Service: General;  Laterality: Right;  Right breast chronic abscess drainage and debridement  . BREAST CYST EXCISION Right   . BREAST DUCTAL SYSTEM EXCISION Left 01/24/2014   Procedure:  LEFT BREAST DUCT EXCISION;  Surgeon: Rolm Bookbinder, MD;  Location: [place];  Service: General;  Laterality: Left;  . BREAST SURGERY  8/12   right breast biopsy  . CESAREAN SECTION     X 3  . DILATATION & CURETTAGE/HYSTEROSCOPY WITH MYOSURE N/A 05/10/2014   Procedure: DILATATION & CURETTAGE/HYSTEROSCOPY WITH MYOSURE ABLATION,RESECTOSCOPIC POLYPECTOMY;  Surgeon: Terrance Mass, MD;  Location: [facility];  Service: Gynecology;  Laterality: N/A;  Confirmed with [employer] rep to be present.  . INCISE AND DRAIN ABCESS     right breast  . IR EMBO VENOUS NOT HEMORR HEMANG  INC GUIDE ROADMAPPING  06/09/2017  . IR RADIOLOGIST EVAL & MGMT  05/04/2017  . IR RADIOLOGIST EVAL & MGMT  06/29/2017  . TUBAL LIGATION  2004    Social History   Socioeconomic  History  . Marital status: Married    Spouse name: Not on file  . Number of children: 3  . Years of education: Not on file  . Highest education level: Not on file  Social Needs  . Financial resource strain: Not on file  . Food insecurity - worry: Not on file  . Food insecurity - inability: Not on file  . Transportation needs - medical: Not on file  . Transportation needs - non-medical: Not on file  Occupational History  . Occupation: [employer]  7pm-4 am  Tobacco Use  . Smoking status: Never Smoker  . Smokeless tobacco: Never Used  Substance and Sexual Activity  . Alcohol use: No    Alcohol/week: 0.0 oz  . Drug use: No  . Sexual activity: Yes    Partners: Male    Birth control/protection: Surgical    Comment: 1st intercourse- 18, partners- 2, married- 11 yrs   Other Topics Concern  . Not on file  Social History Narrative   Original from [place]   [place]: pt, husband, son and G-son          Allergies as of 07/19/2017   No Known Allergies     Medication List        Accurate as of 07/19/17  1:10 PM. Always use your most recent med list.          enoxaparin  80 MG/0.8ML injection Commonly known as:  LOVENOX Take as directed   escitalopram 10 MG tablet Commonly known as:  LEXAPRO Take 1 tablet (10 mg total) by mouth daily.   multivitamin tablet Take 1 tablet daily by mouth.          Objective:   Physical Exam BP 118/80 (BP Location: Left Arm, Patient Position: Sitting, Cuff Size: Small)   Pulse (!) 55   Temp 97.8 F (36.6 C) (Oral)   Resp 14   Ht 5\' 2"  (1.575 m)   Wt 155 lb 8 oz (70.5 kg)   SpO2 97%   BMI 28.44 kg/m  General:   Well developed, well nourished . NAD.  HEENT:  Normocephalic . Face symmetric, atraumatic Skin: Not pale. Not jaundice Neurologic:  alert & oriented X3.  Speech normal, gait appropriate for age and unassisted Psych--  Cognition and judgment appear intact.  Cooperative with normal attention span and concentration.    Behavior appropriate. No anxious or depressed appearing.      Assessment & Plan:    Assessment   Dyslipidemia, high triglycerides Depression  , depression Headache: CT 11/2015 negative + ANA 1:640 nucleolar, C3-C4 normal, ENA negative: saw rheumatology 2018 Chronic lower extremity edema: H/o DVT, s/p coumadin 2003 during pregnancy  Varicose Veins: pt reports ablation L ~ 2014, again 2018 BTL  PLAN:  Anxiety, depression, insomnia: See last visit, tried Lexapro x 1 and stopped d/t perceived s/e (HA, nausea and vomiting).  Did not seek counseling.  Currently doing slightly better emotionally, continue with insomnia and requests something to help her sleep.  Trial with trazodone 50 mg.  Dose can be adjusted up or down depending on results, patient aware.  Will call for a refill if needed. Also, had a vascular procedure and was rx SQ heparin for a DVT  but finished last dose already.  Will send a message to radiology let them know she finished RTC one year, sooner if needed

## 2017-07-19 NOTE — Progress Notes (Signed)
Pre visit review using our clinic review tool, if applicable. No additional management support is needed unless otherwise documented below in the visit note. 

## 2017-07-20 NOTE — Assessment & Plan Note (Signed)
Anxiety, depression, insomnia: See last visit, tried Lexapro x 1 and stopped d/t perceived s/e (HA, nausea and vomiting).  Did not seek counseling.  Currently doing slightly better emotionally, continue with insomnia and requests something to help her sleep.  Trial with trazodone 50 mg.  Dose can be adjusted up or down depending on results, patient aware.  Will call for a refill if needed. Also, had a vascular procedure and was rx SQ heparin for a DVT  but finished last dose already.  Will send a message to radiology let them know she finished RTC one year, sooner if needed

## 2017-07-21 ENCOUNTER — Ambulatory Visit
Admission: RE | Admit: 2017-07-21 | Discharge: 2017-07-21 | Disposition: A | Discharge status: Home / Self Care | Payer: BLUE CROSS/BLUE SHIELD | Source: Ambulatory Visit | Attending: Interventional Radiology | Admitting: Interventional Radiology

## 2017-07-21 ENCOUNTER — Other Ambulatory Visit (HOSPITAL_COMMUNITY): Payer: Self-pay | Admitting: Interventional Radiology

## 2017-07-21 DIAGNOSIS — I83812 Varicose veins of left lower extremities with pain: Secondary | ICD-10-CM

## 2017-07-21 NOTE — Progress Notes (Signed)
Chief Complaint: Patient was seen in follow-up today for venous insufficiency at the request of McCullough,Heath  Referring Physician(s): McCullough,Heath  History of Present Illness: Taylor Parker is a 52 y.o. female with a history of symptomatic venous insufficiency involving the small saphenous vein of the left lower extremity. She underwent percutaneous endovenous laser ablation therapy on 33/29/5188 which was complicated by formation of a nonocclusive class III endovenous heat-induced thrombus (EHIT) extending into the popliteal vein.  She was treated with several weeks of anticoagulation using Lovenox and presents today for scheduled follow-up.  She is doing extremely well and has no symptoms. In fact, her left lower extremity feels the best it has in a long time. No problems with aching, tiredness, heaviness or swelling. She has completed her Lovenox therapy. Her ultrasound today demonstrates no residual thrombus in the popliteal vein. She does have a few residual symptomatic superficial venous varicosities along the posterior and medial aspect of the left calf.  Past Medical History:  Diagnosis Date  . Anxiety   . Depression    no med  . Headache(784.0)    otc med prn  . Hyperlipidemia    diet controlled, no meds  . PMDD (premenstrual dysphoric disorder)   . PONV (postoperative nausea and vomiting)    in the past    Past Surgical History:  Procedure Laterality Date  . BLADDER SURGERY  5-30-218  . BREAST BIOPSY  06/19/2011   Procedure: BREAST BIOPSY;  Surgeon: Rolm Bookbinder, MD;  Location: [place];  Service: General;  Laterality: Right;  Right breast chronic abscess drainage and debridement  . BREAST CYST EXCISION Right   . BREAST DUCTAL SYSTEM EXCISION Left 01/24/2014   Procedure:  LEFT BREAST DUCT EXCISION;  Surgeon: Rolm Bookbinder, MD;  Location: [place];  Service: General;  Laterality: Left;  . BREAST SURGERY  8/12   right breast  biopsy  . CESAREAN SECTION     X 3  . DILATATION & CURETTAGE/HYSTEROSCOPY WITH MYOSURE N/A 05/10/2014   Procedure: DILATATION & CURETTAGE/HYSTEROSCOPY WITH MYOSURE ABLATION,RESECTOSCOPIC POLYPECTOMY;  Surgeon: Terrance Mass, MD;  Location: [facility];  Service: Gynecology;  Laterality: N/A;  Confirmed with My[employer] rep to be present.  . INCISE AND DRAIN ABCESS     right breast  . IR EMBO VENOUS NOT HEMORR HEMANG  INC GUIDE ROADMAPPING  06/09/2017  . IR RADIOLOGIST EVAL & MGMT  05/04/2017  . IR RADIOLOGIST EVAL & MGMT  06/29/2017  . TUBAL LIGATION  2004    Allergies: Patient has no known allergies.  Medications: Prior to Admission medications   Medication Sig Start Date End Date Taking? Authorizing Provider  Multiple Vitamin (MULTIVITAMIN) tablet Take 1 tablet daily by mouth.    [provider]  traZODone (DESYREL) 50 MG tablet Take 0.5-1 tablets (25-50 mg total) by mouth at bedtime as needed for sleep. 07/19/17   Colon Branch, MD     Family History  Problem Relation Age of Onset  . Heart disease Mother   . Diabetes Other        gm  . High Cholesterol Brother        high TG  . Colon cancer Neg Hx   . Breast cancer Neg Hx   . Esophageal cancer Neg Hx   . Stomach cancer Neg Hx   . Rectal cancer Neg Hx     Social History   Socioeconomic History  . Marital status: Married    Spouse name: Not on file  .  Number of children: 3  . Years of education: Not on file  . Highest education level: Not on file  Social Needs  . Financial resource strain: Not on file  . Food insecurity - worry: Not on file  . Food insecurity - inability: Not on file  . Transportation needs - medical: Not on file  . Transportation needs - non-medical: Not on file  Occupational History  . Occupation: [employer]  7pm-4 am  Tobacco Use  . Smoking status: Never Smoker  . Smokeless tobacco: Never Used  Substance and Sexual Activity  . Alcohol use: No    Alcohol/week: 0.0 oz  . Drug use: No  .  Sexual activity: Yes    Partners: Male    Birth control/protection: Surgical    Comment: 1st intercourse- 18, partners- 2, married- 83 yrs   Other Topics Concern  . Not on file  Social History Narrative   Original from [place]   [place]: pt, husband, son and G-son        Review of Systems: A 12 point ROS discussed and pertinent positives are indicated in the HPI above.  All other systems are negative.  Review of Systems  Vital Signs: There were no vitals taken for this visit.  Physical Exam  Constitutional: She is oriented to person, place, and time. She appears well-developed and well-nourished. No distress.  HENT:  Head: Normocephalic and atraumatic.  Eyes: No scleral icterus.  Cardiovascular: Normal rate.  Pulmonary/Chest: Effort normal.  Neurological: She is alert and oriented to person, place, and time.  Skin: Skin is warm and dry.     Psychiatric: She has a normal mood and affect. Her behavior is normal.     Imaging: US Venous Img Lower Unilateral Left  Result Date: 07/21/2017 CLINICAL DATA:  52 year old female with a history of left lower extremity superficial venous insufficiency involving the small saphenous vein. Follow-up evaluation of endo venous heat induced thrombus. EXAM: Left LOWER EXTREMITY VENOUS DUPLEX ULTRASOUND TECHNIQUE: Gray-scale sonography with graded compression, as well as color Doppler and duplex ultrasound, were performed to evaluate the deep and superficial veins of the left lower extremity. Spectral Doppler was utilized to evaluate flow at rest and with distal augmentation maneuvers. A complete superficial venous insufficiency exam was performed reverse Trendelenburg position. I personally performed the technical portion of the exam. COMPARISON:  None. FINDINGS: Deep Venous System: Evaluation of the deep venous system including the common femoral, femoral, profunda femoral, popliteal and calf veins (where visible) demonstrate no evidence of deep  venous thrombosis. The vessels are compressible and demonstrate normal respiratory phasicity and response to augmentation. The previously noted thrombus extending into the popliteal vein has completely resolved. No evidence of deep venous reflux. Superficial Venous System: SPJ: Patent SSV Prox: Occluded SSV Mid: Occluded SSV Distal: Not visualized Other: Persistent patent superficial venous varicosities in the posterior and medial aspect of the left calf. These drain into an incompetent perforator vein in the medial aspect of the mid calf. IMPRESSION: 1. Interval resolution of EHIT. 2. Previously treated small saphenous vein remains occluded. 3. There are a few patent superficial venous varicosities and a patent incompetent perforator vein in the posterior and medial aspect of the left calf. These vessels are amenable to percutaneous sclerotherapy. Signed, Criselda Peaches, MD [facility] Radiology Electronically Signed   By: Jacqulynn Cadet M.D.   On: 07/21/2017 12:51   US Venous Img Lower Unilateral Left  Result Date: 06/29/2017 CLINICAL DATA:  52 year old female with a history of left lower extremity superficial venous insufficiency now status post endo venous laser ablation of the left small saphenous vein. Her EVLT was complicated by endo venous heat induced thrombus (EHIT) formation. She was began on Lovenox and presents today for follow-up evaluation to evaluate for thrombus propagation. EXAM: Left LOWER EXTREMITY VENOUS DUPLEX ULTRASOUND TECHNIQUE: Gray-scale sonography with graded compression, as well as color Doppler and duplex ultrasound, were performed to evaluate the deep and superficial veins of the left lower extremity. Spectral Doppler was utilized to evaluate flow at rest and with distal augmentation maneuvers. A complete superficial venous insufficiency exam was performed reverse Trendelenburg position. I personally supervised the technical  portion of the exam. COMPARISON:  Prior duplex ultrasound evaluation 06/16/2017 FINDINGS: Deep Venous System: Evaluation of the deep venous system including the common femoral, femoral, profunda femoral, popliteal and calf veins (where visible) demonstrate no evidence of deep venous thrombosis. The vessels are compressible and demonstrate normal respiratory phasicity and response to augmentation. No evidence of deep venous reflux. Superficial Venous System: SPJ: Occluded. Minimal protrusion of the thrombus toward the popliteal vein. Extension into the popliteal vein has resolved. SSV Prox: Occluded SSV Mid: Occluded SSV Distal: Patent. Other: Some small residual superficial venous varicosities remain patent in the posterior calf. IMPRESSION: 1. Interval improvement in endovenous heat induced thrombus from Class 3 to Class 1. 2. No evidence of new deep venous thrombosis. 3. Successful occlusion of the small saphenous vein. 4. Small residual patent venous varicosities in the posterior calf. Signed, Criselda Peaches, MD [facility] Radiology Electronically Signed   By: Jacqulynn Cadet M.D.   On: 06/29/2017 14:46   [place] Injec Sclerotherapy Single  Result Date: 07/21/2017 INDICATION: 52 year old female with symptomatic left lower extremity venous insufficiency. Despite successful endo venous laser ablation of the incompetent small saphenous vein, she has several persistent symptomatic superficial venous varicosities and presents for sclerotherapy. EXAM: ULTRASOUND INJECTION OF SCLEROSANT; SINGLE INCOMPETENT VEIN MEDICATIONS: None ANESTHESIA/SEDATION: None. COMPLICATIONS: None immediate. PROCEDURE: Informed written consent was obtained from the patient after a thorough discussion of the procedural risks, benefits and alternatives. All questions were addressed. Maximal Sterile Barrier Technique was utilized including caps, mask, sterile gowns, sterile gloves, sterile  drape, hand hygiene and skin antiseptic. A timeout was performed prior to the initiation of the procedure. Survey ultrasound of the left lower extremity was performed and an appropriate skin entry site was marked. The site was then prepped with chlorhexidine and draped in standard fashion. Under direct ultrasound guidance a foam of 1% polidocanol was injected in small aliquots into the residual patent symptomatic varicose vein until there was good filling of the affected region. FINDINGS: Persistent incompetent symptomatic varicose vein located in the posterior and medial calf in the left lower extremity. Injection of 2 mL sclerosant. IMPRESSION: Technically successful foam sclerotherapy of residual symptomatic left lower extremity varicose veins. Signed, Criselda Peaches, MD [facility] Electronically Signed   By: Jacqulynn Cadet M.D.   On: 07/21/2017 12:53   [place] Rad Eval And Mgmt  Result Date: 07/21/2017 Please refer to "Notes" to see consult details.  Ir Radiologist Eval & Mgmt  Result Date: 06/29/2017 Please refer to notes tab for details about interventional procedure. (Op Note)   Labs:  CBC: Recent Labs    10/05/16 1731 04/04/17 1157  WBC 9.1 9.0  HGB 13.3 14.2  HCT 39.3 40.6  PLT 229 210    COAGS:  No results for input(s): INR, APTT in the last 8760 hours.  BMP: Recent Labs    10/05/16 1731 10/16/16 1204 04/04/17 1157  NA 138 139 137  K 3.7 3.9 3.5  CL 108 107 105  CO2 23 26 22   GLUCOSE 100* 100* 105*  BUN 9 15 10   CALCIUM 8.5* 9.1 8.7*  CREATININE 0.73 0.66 0.66  GFRNONAA >60  --  >60  GFRAA >60  --  >60    LIVER FUNCTION TESTS: Recent Labs    10/05/16 1731 04/04/17 1157 04/12/17 0955  BILITOT 0.8 0.9  --   AST 14* 21  --   ALT 14 21  --   ALKPHOS 67 70  --   PROT 6.8 6.9 6.6  ALBUMIN 3.6 3.7  --     TUMOR MARKERS: No results for input(s): AFPTM, CEA, CA199, CHROMGRNA in the last 8760  hours.  Assessment and Plan:  Doing very well. The previously noted endovenous heat induced thrombus has completely resolved. No further need for anticoagulation. Patient has just a few residual symptomatic superficial venous varicosities in the posterior and medial aspect of the left calf. We will treat those with sclerotherapy today.  1.) Return to clinic in one week for post sclerotherapy check. 2.) Continue wearing compression hose daily for the next week. After that, compression hose can be optional.  Electronically Signed: [place], [place] 07/21/2017, 12:56 PM   I spent a total of  15 Minutes in face to face in clinical consultation, greater than 50% of which was counseling/coordinating care for venous insufficiency, EHIT

## 2017-07-28 ENCOUNTER — Ambulatory Visit
Admission: RE | Admit: 2017-07-28 | Discharge: 2017-07-28 | Disposition: A | Discharge status: Home / Self Care | Payer: BLUE CROSS/BLUE SHIELD | Source: Ambulatory Visit | Attending: Interventional Radiology | Admitting: Interventional Radiology

## 2017-07-28 ENCOUNTER — Other Ambulatory Visit (HOSPITAL_COMMUNITY): Payer: Self-pay | Admitting: Interventional Radiology

## 2017-07-28 DIAGNOSIS — I83812 Varicose veins of left lower extremities with pain: Secondary | ICD-10-CM

## 2017-07-28 NOTE — Progress Notes (Signed)
Chief Complaint: Patient was seen in consultation today for post sclerotherapy follow-up at the request of Blanket  Referring Physician(s): McCullough,Heath; Kathlene November  History of Present Illness: Taylor Parker is a 52 y.o. female with symptomatic left lower extremity venous insufficiency. Patient underwent transcatheter laser ablation of the left short saphenous vein on 06/09/2017. After the ablation, patient developed endovascular heat-induced thrombus extending into the left popliteal vein. Patient was on Lovenox for a few weeks. Patient underwent ultrasound-guided sclerotherapy of residual varicose veins on 07/21/2017. Patient presents for follow-up. The patient is asymptomatic. She was wearing the compression hose regularly for the past week.   Past Medical History:  Diagnosis Date  . Anxiety   . Depression    no med  . Headache(784.0)    otc med prn  . Hyperlipidemia    diet controlled, no meds  . PMDD (premenstrual dysphoric disorder)   . PONV (postoperative nausea and vomiting)    in the past    Past Surgical History:  Procedure Laterality Date  . BLADDER SURGERY  5-30-218  . BREAST BIOPSY  06/19/2011   Procedure: BREAST BIOPSY;  Surgeon: Rolm Bookbinder, MD;  Location: [facility];  Service: General;  Laterality: Right;  Right breast chronic abscess drainage and debridement  . BREAST CYST EXCISION Right   . BREAST DUCTAL SYSTEM EXCISION Left 01/24/2014   Procedure:  LEFT BREAST DUCT EXCISION;  Surgeon: Rolm Bookbinder, MD;  Location: [facility];  Service: General;  Laterality: Left;  . BREAST SURGERY  8/12   right breast biopsy  . CESAREAN SECTION     X 3  . DILATATION & CURETTAGE/HYSTEROSCOPY WITH MYOSURE N/A 05/10/2014   Procedure: DILATATION & CURETTAGE/HYSTEROSCOPY WITH MYOSURE ABLATION,RESECTOSCOPIC POLYPECTOMY;  Surgeon: Terrance Mass, MD;  Location: [facility];  Service: Gynecology;  Laterality: N/A;  Confirmed with Myosure rep  to be present.  . INCISE AND DRAIN ABCESS     right breast  . IR EMBO VENOUS NOT HEMORR HEMANG  INC GUIDE ROADMAPPING  06/09/2017  . IR RADIOLOGIST EVAL & MGMT  05/04/2017  . IR RADIOLOGIST EVAL & MGMT  06/29/2017  . TUBAL LIGATION  2004    Allergies: Patient has no known allergies.  Medications: Prior to Admission medications   Medication Sig Start Date End Date Taking? Authorizing Provider  Multiple Vitamin (MULTIVITAMIN) tablet Take 1 tablet daily by mouth.    [provider]  traZODone (DESYREL) 50 MG tablet Take 0.5-1 tablets (25-50 mg total) by mouth at bedtime as needed for sleep. 07/19/17   Colon Branch, MD     Family History  Problem Relation Age of Onset  . Heart disease Mother   . Diabetes Other        gm  . High Cholesterol Brother        high TG  . Colon cancer Neg Hx   . Breast cancer Neg Hx   . Esophageal cancer Neg Hx   . Stomach cancer Neg Hx   . Rectal cancer Neg Hx     Social History   Socioeconomic History  . Marital status: Married    Spouse name: Not on file  . Number of children: 3  . Years of education: Not on file  . Highest education level: Not on file  Social Needs  . Financial resource strain: Not on file  . Food insecurity - worry: Not on file  . Food insecurity - inability: Not on file  . Transportation needs - medical:  Not on file  . Transportation needs - non-medical: Not on file  Occupational History  . Occupation: [employer]  7pm-4 am  Tobacco Use  . Smoking status: Never Smoker  . Smokeless tobacco: Never Used  Substance and Sexual Activity  . Alcohol use: No    Alcohol/week: 0.0 oz  . Drug use: No  . Sexual activity: Yes    Partners: Male    Birth control/protection: Surgical    Comment: 1st intercourse- 18, partners- 2, married- 41 yrs   Other Topics Concern  . Not on file  Social History Narrative   Original from [place]   [place]: pt, husband, son and G-son        Review of Systems  Constitutional:  Negative.   Musculoskeletal:       No pain    Vital Signs: There were no vitals taken for this visit.  Physical Exam  Constitutional: No distress.  Musculoskeletal: She exhibits no edema or tenderness.  No significant swelling in the left lower extremity. The previous puncture sites are well-healed. No erythema or bleeding. No evidence of skin breakdown.       Imaging: US Venous Img Lower Unilateral Left  Result Date: 07/28/2017 CLINICAL DATA:  52 year old with symptomatic left lower extremity venous insufficiency. Patient underwent transcatheter laser ablation of the left short saphenous vein on 06/09/2017. Patient developed endovascular heat-induced thrombus extending into the popliteal vein. Patient was treated for several weeks with anticoagulation. Patient underwent ultrasound guided sclerotherapy to residual varicose veins on 07/21/2017. Patient presents for follow-up. Patient is asymptomatic. EXAM: LEFT LOWER EXTREMITY VENOUS DOPPLER ULTRASOUND TECHNIQUE: Gray-scale sonography with graded compression, as well as color Doppler and duplex ultrasound were performed to evaluate the lower extremity deep venous systems from the level of the common femoral vein and including the common femoral, femoral, profunda femoral, popliteal and calf veins including the posterior tibial, peroneal and gastrocnemius veins when visible. The superficial great saphenous vein was also interrogated. Spectral Doppler was utilized to evaluate flow at rest and with distal augmentation maneuvers in the common femoral, femoral and popliteal veins. COMPARISON:  07/21/2017 FINDINGS: Common Femoral Vein: No evidence of thrombus. Normal compressibility, color Doppler flow and phasicity. Saphenofemoral Junction: No evidence of thrombus. Normal compressibility and flow on color Doppler imaging. Profunda Femoral Vein: No evidence of thrombus. Normal compressibility and flow on color Doppler imaging. Femoral Vein: No evidence  of thrombus. Normal compressibility, color Doppler flow and phasicity. Popliteal Vein: Thrombus at the saphenopopliteal junction. Thrombus extends along the posterior popliteal vein wall adjacent to the saphenopopliteal junction. Popliteal vein remains patent with some compressibility. The amount of thrombus at the saphenopopliteal junction and within the popliteal vein may have increased compared to the recent comparison examination. Calf Veins: The treated proximal short saphenous vein remains thrombosed. This treated segment extends into varicosities in the left calf. These varicosities are now thrombosed. There appears to be a small short saphenous vein within the calf which remains patent. IMPRESSION: Residual thrombus at the left saphenopopliteal junction and left popliteal vein. Clot burden is clearly smaller since 06/16/2017 but concern for increased eccentric thrombus compared to 07/21/2017. Left popliteal vein thrombus is nonocclusive. No other evidence for DVT. The treated short saphenous vein and varicosities in left calf are now occluded. Electronically Signed   By: Markus Daft M.D.   On: 07/28/2017 13:27   US Venous Img Lower Unilateral Left  Result Date: 07/21/2017 CLINICAL DATA:  52 year old female with a history of left lower extremity superficial  venous insufficiency involving the small saphenous vein. Follow-up evaluation of endo venous heat induced thrombus. EXAM: Left LOWER EXTREMITY VENOUS DUPLEX ULTRASOUND TECHNIQUE: Gray-scale sonography with graded compression, as well as color Doppler and duplex ultrasound, were performed to evaluate the deep and superficial veins of the left lower extremity. Spectral Doppler was utilized to evaluate flow at rest and with distal augmentation maneuvers. A complete superficial venous insufficiency exam was performed reverse Trendelenburg position. I personally performed the technical portion of the exam. COMPARISON:  None. FINDINGS: Deep Venous System:  Evaluation of the deep venous system including the common femoral, femoral, profunda femoral, popliteal and calf veins (where visible) demonstrate no evidence of deep venous thrombosis. The vessels are compressible and demonstrate normal respiratory phasicity and response to augmentation. The previously noted thrombus extending into the popliteal vein has completely resolved. No evidence of deep venous reflux. Superficial Venous System: SPJ: Patent SSV Prox: Occluded SSV Mid: Occluded SSV Distal: Not visualized Other: Persistent patent superficial venous varicosities in the posterior and medial aspect of the left calf. These drain into an incompetent perforator vein in the medial aspect of the mid calf. IMPRESSION: 1. Interval resolution of EHIT. 2. Previously treated small saphenous vein remains occluded. 3. There are a few patent superficial venous varicosities and a patent incompetent perforator vein in the posterior and medial aspect of the left calf. These vessels are amenable to percutaneous sclerotherapy. Signed, Criselda Peaches, MD [facility] Electronically Signed   By: Jacqulynn Cadet M.D.   On: 07/21/2017 12:51   US Venous Img Lower Unilateral Left  Result Date: 06/29/2017 CLINICAL DATA:  52 year old female with a history of left lower extremity superficial venous insufficiency now status post endo venous laser ablation of the left small saphenous vein. Her EVLT was complicated by endo venous heat induced thrombus (EHIT) formation. She was began on Lovenox and presents today for follow-up evaluation to evaluate for thrombus propagation. EXAM: Left LOWER EXTREMITY VENOUS DUPLEX ULTRASOUND TECHNIQUE: Gray-scale sonography with graded compression, as well as color Doppler and duplex ultrasound, were performed to evaluate the deep and superficial veins of the left lower extremity. Spectral Doppler was utilized to evaluate flow at rest and  with distal augmentation maneuvers. A complete superficial venous insufficiency exam was performed reverse Trendelenburg position. I personally supervised the technical portion of the exam. COMPARISON:  Prior duplex ultrasound evaluation 06/16/2017 FINDINGS: Deep Venous System: Evaluation of the deep venous system including the common femoral, femoral, profunda femoral, popliteal and calf veins (where visible) demonstrate no evidence of deep venous thrombosis. The vessels are compressible and demonstrate normal respiratory phasicity and response to augmentation. No evidence of deep venous reflux. Superficial Venous System: SPJ: Occluded. Minimal protrusion of the thrombus toward the popliteal vein. Extension into the popliteal vein has resolved. SSV Prox: Occluded SSV Mid: Occluded SSV Distal: Patent. Other: Some small residual superficial venous varicosities remain patent in the posterior calf. IMPRESSION: 1. Interval improvement in endovenous heat induced thrombus from Class 3 to Class 1. 2. No evidence of new deep venous thrombosis. 3. Successful occlusion of the small saphenous vein. 4. Small residual patent venous varicosities in the posterior calf. Signed, Criselda Peaches, MD [facility] Electronically Signed   By: Jacqulynn Cadet M.D.   On: 06/29/2017 14:46   [place] Injec Sclerotherapy Single  Result Date: 07/21/2017 INDICATION: 52 year old female with symptomatic left lower extremity venous insufficiency. Despite successful endo venous laser ablation of the incompetent  small saphenous vein, she has several persistent symptomatic superficial venous varicosities and presents for sclerotherapy. EXAM: ULTRASOUND INJECTION OF SCLEROSANT; SINGLE INCOMPETENT VEIN MEDICATIONS: None ANESTHESIA/SEDATION: None. COMPLICATIONS: None immediate. PROCEDURE: Informed written consent was obtained from the patient after a thorough discussion of the procedural  risks, benefits and alternatives. All questions were addressed. Maximal Sterile Barrier Technique was utilized including caps, mask, sterile gowns, sterile gloves, sterile drape, hand hygiene and skin antiseptic. A timeout was performed prior to the initiation of the procedure. Survey ultrasound of the left lower extremity was performed and an appropriate skin entry site was marked. The site was then prepped with chlorhexidine and draped in standard fashion. Under direct ultrasound guidance a foam of 1% polidocanol was injected in small aliquots into the residual patent symptomatic varicose vein until there was good filling of the affected region. FINDINGS: Persistent incompetent symptomatic varicose vein located in the posterior and medial calf in the left lower extremity. Injection of 2 mL sclerosant. IMPRESSION: Technically successful foam sclerotherapy of residual symptomatic left lower extremity varicose veins. Signed, Criselda Peaches, MD [facility] Electronically Signed   By: Jacqulynn Cadet M.D.   On: 07/21/2017 12:53   [place] Rad Eval And Mgmt  Result Date: 07/28/2017 Please refer to "Notes" to see consult details.  [place] Rad Eval And Mgmt  Result Date: 07/21/2017 Please refer to "Notes" to see consult details.  Ir Radiologist Eval & Mgmt  Result Date: 06/29/2017 Please refer to notes tab for details about interventional procedure. (Op Note)   Labs:  CBC: Recent Labs    10/05/16 1731 04/04/17 1157  WBC 9.1 9.0  HGB 13.3 14.2  HCT 39.3 40.6  PLT 229 210    COAGS: No results for input(s): INR, APTT in the last 8760 hours.  BMP: Recent Labs    10/05/16 1731 10/16/16 1204 04/04/17 1157  NA 138 139 137  K 3.7 3.9 3.5  CL 108 107 105  CO2 23 26 22   GLUCOSE 100* 100* 105*  BUN 9 15 10   CALCIUM 8.5* 9.1 8.7*  CREATININE 0.73 0.66 0.66  GFRNONAA >60  --  >60  GFRAA >60  --  >60    LIVER FUNCTION TESTS: Recent  Labs    10/05/16 1731 04/04/17 1157 04/12/17 0955  BILITOT 0.8 0.9  --   AST 14* 21  --   ALT 14 21  --   ALKPHOS 67 70  --   PROT 6.8 6.9 6.6  ALBUMIN 3.6 3.7  --     TUMOR MARKERS: No results for input(s): AFPTM, CEA, CA199, CHROMGRNA in the last 8760 hours.  Assessment and Plan:  17 female with symptomatic left lower extremity superficial venous insufficiency. The venous insufficiency has been treated with transcatheter laser ablation and ultrasound-guided sclerotherapy. The treated short saphenous vein and varicose veins are now thrombosed.   There is thrombus at the saphenopopliteal junction and a small amount of eccentric thrombus in the left popliteal vein. There is concern that this thrombus has increased since 07/21/2016 and sclerotherapy. Left popliteal vein thrombus is nonocclusive and there still flow in left popliteal vein. Patient is currently not taking anticoagulation. I discussed these imaging findings with Dr. Laurence Ferrari. I feel the patient is low risk for clot propagation. However, we'll plan for a one week follow-up ultrasound to reassess this thrombus. Patient was made aware of this finding. Patient was educated about signs and symptoms of a lower extremity DVT.   Thank you  for this interesting consult.  I greatly enjoyed meeting Taylor Parker and look forward to participating in their care.  A copy of this report was sent to the requesting provider on this date.  Electronically Signed: Burman Riis 07/28/2017, 2:20 PM   I spent a total of    15 Minutes in face to face in clinical consultation, greater than 50% of which was counseling/coordinating care for venous insufficiency.    Patient ID: Taylor Parker, female   DOB: 07-Aug-1965, 52 y.o.   MRN: [id number]

## 2017-08-04 ENCOUNTER — Ambulatory Visit
Admission: RE | Admit: 2017-08-04 | Discharge: 2017-08-04 | Disposition: A | Discharge status: Home / Self Care | Payer: BLUE CROSS/BLUE SHIELD | Source: Ambulatory Visit | Attending: Interventional Radiology | Admitting: Interventional Radiology

## 2017-08-04 DIAGNOSIS — I83812 Varicose veins of left lower extremities with pain: Secondary | ICD-10-CM

## 2017-08-04 NOTE — Progress Notes (Signed)
Chief Complaint: Patient was seen in consultation today for No chief complaint on file.  at the request of [facility]  Referring Physician(s): McCullough,Heath  History of Present Illness: Taylor Parker is a 52 y.o. female who returns 2 weeks status post sclerotherapy in the left calf for varicosities. She does have a history of partially occlusive popliteal vein DVT after laser therapy as well as partially occlusive DVT after sclerotherapy 2 weeks ago. She returned last week with partially occlusive DVT which was not treated with anticoagulants. Today she returns for an additional 1 week follow-up. She denies any swelling of the left lower extremity. She has no symptoms in her left leg and is very satisfied with therapy. She denies any fevers or chills.  Past Medical History:  Diagnosis Date  . Anxiety   . Depression    no med  . Headache(784.0)    otc med prn  . Hyperlipidemia    diet controlled, no meds  . PMDD (premenstrual dysphoric disorder)   . PONV (postoperative nausea and vomiting)    in the past    Past Surgical History:  Procedure Laterality Date  . BLADDER SURGERY  5-30-218  . BREAST BIOPSY  06/19/2011   Procedure: BREAST BIOPSY;  Surgeon: Rolm Bookbinder, MD;  Location: [facility];  Service: General;  Laterality: Right;  Right breast chronic abscess drainage and debridement  . BREAST CYST EXCISION Right   . BREAST DUCTAL SYSTEM EXCISION Left 01/24/2014   Procedure:  LEFT BREAST DUCT EXCISION;  Surgeon: Rolm Bookbinder, MD;  Location: [facility];  Service: General;  Laterality: Left;  . BREAST SURGERY  8/12   right breast biopsy  . CESAREAN SECTION     X 3  . DILATATION & CURETTAGE/HYSTEROSCOPY WITH MYOSURE N/A 05/10/2014   Procedure: DILATATION & CURETTAGE/HYSTEROSCOPY WITH MYOSURE ABLATION,RESECTOSCOPIC POLYPECTOMY;  Surgeon: Terrance Mass, MD;  Location: [facility];  Service: Gynecology;  Laterality: N/A;  Confirmed with Myosure  rep to be present.  . INCISE AND DRAIN ABCESS     right breast  . IR EMBO VENOUS NOT HEMORR HEMANG  INC GUIDE ROADMAPPING  06/09/2017  . IR RADIOLOGIST EVAL & MGMT  05/04/2017  . IR RADIOLOGIST EVAL & MGMT  06/29/2017  . TUBAL LIGATION  2004    Allergies: Patient has no known allergies.  Medications: Prior to Admission medications   Medication Sig Start Date End Date Taking? Authorizing Provider  Multiple Vitamin (MULTIVITAMIN) tablet Take 1 tablet daily by mouth.    [provider]  traZODone (DESYREL) 50 MG tablet Take 0.5-1 tablets (25-50 mg total) by mouth at bedtime as needed for sleep. 07/19/17   Colon Branch, MD     Family History  Problem Relation Age of Onset  . Heart disease Mother   . Diabetes Other        gm  . High Cholesterol Brother        high TG  . Colon cancer Neg Hx   . Breast cancer Neg Hx   . Esophageal cancer Neg Hx   . Stomach cancer Neg Hx   . Rectal cancer Neg Hx     Social History   Socioeconomic History  . Marital status: Married    Spouse name: Not on file  . Number of children: 3  . Years of education: Not on file  . Highest education level: Not on file  Social Needs  . Financial resource strain: Not on file  . Food insecurity -  worry: Not on file  . Food insecurity - inability: Not on file  . Transportation needs - medical: Not on file  . Transportation needs - non-medical: Not on file  Occupational History  . Occupation: [employer]  7pm-4 am  Tobacco Use  . Smoking status: Never Smoker  . Smokeless tobacco: Never Used  Substance and Sexual Activity  . Alcohol use: No    Alcohol/week: 0.0 oz  . Drug use: No  . Sexual activity: Yes    Partners: Male    Birth control/protection: Surgical    Comment: 1st intercourse- 18, partners- 2, married- 30 yrs   Other Topics Concern  . Not on file  Social History Narrative   Original from [place]   [place]: pt, husband, son and G-son         Review of Systems: A 12 point ROS  discussed and pertinent positives are indicated in the HPI above.  All other systems are negative.  Review of Systems  Vital Signs: There were no vitals taken for this visit.  Physical Exam Examination of the left lower extremity demonstrates no edema and no skin breakdown. No varicosities are identified. The extremity is warm and pink. Pulses are intact distally.    Imaging: US Venous Img Lower Unilateral Left  Result Date: 07/28/2017 CLINICAL DATA:  52 year old with symptomatic left lower extremity venous insufficiency. Patient underwent transcatheter laser ablation of the left short saphenous vein on 06/09/2017. Patient developed endovascular heat-induced thrombus extending into the popliteal vein. Patient was treated for several weeks with anticoagulation. Patient underwent ultrasound guided sclerotherapy to residual varicose veins on 07/21/2017. Patient presents for follow-up. Patient is asymptomatic. EXAM: LEFT LOWER EXTREMITY VENOUS DOPPLER ULTRASOUND TECHNIQUE: Gray-scale sonography with graded compression, as well as color Doppler and duplex ultrasound were performed to evaluate the lower extremity deep venous systems from the level of the common femoral vein and including the common femoral, femoral, profunda femoral, popliteal and calf veins including the posterior tibial, peroneal and gastrocnemius veins when visible. The superficial great saphenous vein was also interrogated. Spectral Doppler was utilized to evaluate flow at rest and with distal augmentation maneuvers in the common femoral, femoral and popliteal veins. COMPARISON:  07/21/2017 FINDINGS: Common Femoral Vein: No evidence of thrombus. Normal compressibility, color Doppler flow and phasicity. Saphenofemoral Junction: No evidence of thrombus. Normal compressibility and flow on color Doppler imaging. Profunda Femoral Vein: No evidence of thrombus. Normal compressibility and flow on color Doppler imaging. Femoral Vein: No  evidence of thrombus. Normal compressibility, color Doppler flow and phasicity. Popliteal Vein: Thrombus at the saphenopopliteal junction. Thrombus extends along the posterior popliteal vein wall adjacent to the saphenopopliteal junction. Popliteal vein remains patent with some compressibility. The amount of thrombus at the saphenopopliteal junction and within the popliteal vein may have increased compared to the recent comparison examination. Calf Veins: The treated proximal short saphenous vein remains thrombosed. This treated segment extends into varicosities in the left calf. These varicosities are now thrombosed. There appears to be a small short saphenous vein within the calf which remains patent. IMPRESSION: Residual thrombus at the left saphenopopliteal junction and left popliteal vein. Clot burden is clearly smaller since 06/16/2017 but concern for increased eccentric thrombus compared to 07/21/2017. Left popliteal vein thrombus is nonocclusive. No other evidence for DVT. The treated short saphenous vein and varicosities in left calf are now occluded. Electronically Signed   By: Markus Daft M.D.   On: 07/28/2017 13:27   US Venous Img Lower Unilateral Left  Result Date: 07/21/2017 CLINICAL DATA:  52 year old female with a history of left lower extremity superficial venous insufficiency involving the small saphenous vein. Follow-up evaluation of endo venous heat induced thrombus. EXAM: Left LOWER EXTREMITY VENOUS DUPLEX ULTRASOUND TECHNIQUE: Gray-scale sonography with graded compression, as well as color Doppler and duplex ultrasound, were performed to evaluate the deep and superficial veins of the left lower extremity. Spectral Doppler was utilized to evaluate flow at rest and with distal augmentation maneuvers. A complete superficial venous insufficiency exam was performed reverse Trendelenburg position. I personally performed the technical portion of the exam. COMPARISON:  None. FINDINGS: Deep Venous  System: Evaluation of the deep venous system including the common femoral, femoral, profunda femoral, popliteal and calf veins (where visible) demonstrate no evidence of deep venous thrombosis. The vessels are compressible and demonstrate normal respiratory phasicity and response to augmentation. The previously noted thrombus extending into the popliteal vein has completely resolved. No evidence of deep venous reflux. Superficial Venous System: SPJ: Patent SSV Prox: Occluded SSV Mid: Occluded SSV Distal: Not visualized Other: Persistent patent superficial venous varicosities in the posterior and medial aspect of the left calf. These drain into an incompetent perforator vein in the medial aspect of the mid calf. IMPRESSION: 1. Interval resolution of EHIT. 2. Previously treated small saphenous vein remains occluded. 3. There are a few patent superficial venous varicosities and a patent incompetent perforator vein in the posterior and medial aspect of the left calf. These vessels are amenable to percutaneous sclerotherapy. Signed, Criselda Peaches, MD [facility] Electronically Signed   By: Jacqulynn Cadet M.D.   On: 07/21/2017 12:51   [place] Injec Sclerotherapy Single  Result Date: 07/21/2017 INDICATION: 52 year old female with symptomatic left lower extremity venous insufficiency. Despite successful endo venous laser ablation of the incompetent small saphenous vein, she has several persistent symptomatic superficial venous varicosities and presents for sclerotherapy. EXAM: ULTRASOUND INJECTION OF SCLEROSANT; SINGLE INCOMPETENT VEIN MEDICATIONS: None ANESTHESIA/SEDATION: None. COMPLICATIONS: None immediate. PROCEDURE: Informed written consent was obtained from the patient after a thorough discussion of the procedural risks, benefits and alternatives. All questions were addressed. Maximal Sterile Barrier Technique was utilized including caps, mask, sterile  gowns, sterile gloves, sterile drape, hand hygiene and skin antiseptic. A timeout was performed prior to the initiation of the procedure. Survey ultrasound of the left lower extremity was performed and an appropriate skin entry site was marked. The site was then prepped with chlorhexidine and draped in standard fashion. Under direct ultrasound guidance a foam of 1% polidocanol was injected in small aliquots into the residual patent symptomatic varicose vein until there was good filling of the affected region. FINDINGS: Persistent incompetent symptomatic varicose vein located in the posterior and medial calf in the left lower extremity. Injection of 2 mL sclerosant. IMPRESSION: Technically successful foam sclerotherapy of residual symptomatic left lower extremity varicose veins. Signed, Criselda Peaches, MD [facility] Electronically Signed   By: Jacqulynn Cadet M.D.   On: 07/21/2017 12:53   [place] Rad Eval And Mgmt  Result Date: 07/28/2017 Please refer to "Notes" to see consult details.  [place] Rad Eval And Mgmt  Result Date: 07/21/2017 Please refer to "Notes" to see consult details.  Venous Doppler was performed today. Partially occlusive DVT in the pop to vein has nearly completely resolved and there is persistent occlusion of the lesser saphenous vein as well as calf varicosities.   Labs:  CBC: Recent Labs    10/05/16  1731 04/04/17 1157  WBC 9.1 9.0  HGB 13.3 14.2  HCT 39.3 40.6  PLT 229 210    COAGS: No results for input(s): INR, APTT in the last 8760 hours.  BMP: Recent Labs    10/05/16 1731 10/16/16 1204 04/04/17 1157  NA 138 139 137  K 3.7 3.9 3.5  CL 108 107 105  CO2 23 26 22   GLUCOSE 100* 100* 105*  BUN 9 15 10   CALCIUM 8.5* 9.1 8.7*  CREATININE 0.73 0.66 0.66  GFRNONAA >60  --  >60  GFRAA >60  --  >60    LIVER FUNCTION TESTS: Recent Labs    10/05/16 1731 04/04/17 1157 04/12/17 0955  BILITOT 0.8 0.9   --   AST 14* 21  --   ALT 14 21  --   ALKPHOS 67 70  --   PROT 6.8 6.9 6.6  ALBUMIN 3.6 3.7  --     TUMOR MARKERS: No results for input(s): AFPTM, CEA, CA199, CHROMGRNA in the last 8760 hours.  Assessment and Plan:  She has had successful treatment with persistent occlusion of the left lesser saphenous vein and varicosities. In addition, there has been further improvement in the partially occlusive DVT in the popliteal vein. It is now nearly resolved. No anticoagulation is indicated at this time. She was advanced to full activity and will follow-up at the six-month interval from her original ablation treatment.   Electronically Signed: Dorsie Sethi, ART A 08/04/2017, 9:49 AM   I spent a total of   15 Minutes in face to face in clinical consultation, greater than 50% of which was counseling/coordinating care for venous insufficiency in the left lower extremity.

## 2017-08-14 ENCOUNTER — Other Ambulatory Visit: Payer: Self-pay | Admitting: Internal Medicine

## 2017-10-11 ENCOUNTER — Other Ambulatory Visit: Payer: Self-pay | Admitting: Internal Medicine

## 2017-10-27 NOTE — Progress Notes (Signed)
Office Visit Note  Patient: Taylor Parker             Date of Birth: 10/07/1965           MRN: [id number]             PCP: Colon Branch, MD Referring: Colon Branch, MD Visit Date: 11/10/2017 Occupation: '@GUAROCC'$ @   Interpreter: Miquel Dunn   Subjective:  Right SI joint pain   History of Present Illness: Taylor Parker is a 52 y.o. female with history of positive ANA, osteoarthritis, trochanteric bursitis of right hip.  Patient states she continues to have right knee pain and swelling.  She states that she is having some pain behind her right knee.  She states that at times she will have discomfort with range of motion.  She states that time she also experiences a catching and locking sensation.  She denies any recent injuries.  She states that the pain and swelling is most severe if she is standing for prolonged periods of time or at work.  She states that she ices her knee after work and wears a compression sleeve.  She states that the previous cortisone injection did not help with the pain would improve the swelling.  She states that her right knee is not causing discomfort at this time due to not working for the past 3 days.  She denies any pain in her left knee at this time.  She states that she experiences some stiffness in her hands but denies any pain or swelling.  She states she continues to have tenderness of the right trochanteric bursa and right SI joint pain.  She states she experiences symptoms of sciatica as well.   Activities of Daily Living:  Patient reports morning stiffness for 5-10  minutes.   Patient Denies nocturnal pain.  Difficulty dressing/grooming: Denies Difficulty climbing stairs: Reports Difficulty getting out of chair: Denies Difficulty using hands for taps, buttons, cutlery, and/or writing: Denies   Review of Systems  Constitutional: Negative for fatigue.  HENT: Negative for mouth sores, mouth dryness and nose dryness.   Eyes: Negative  for pain, visual disturbance and dryness.  Respiratory: Negative for cough, hemoptysis, shortness of breath and difficulty breathing.   Cardiovascular: Negative for chest pain, palpitations, hypertension and swelling in legs/feet.  Gastrointestinal: Negative for blood in stool, constipation and diarrhea.  Endocrine: Negative for increased urination.  Genitourinary: Negative for painful urination.  Musculoskeletal: Positive for arthralgias, joint pain, joint swelling and morning stiffness. Negative for myalgias, muscle weakness, muscle tenderness and myalgias.  Skin: Negative for color change, pallor, rash, hair loss, nodules/bumps, skin tightness, ulcers and sensitivity to sunlight.  Allergic/Immunologic: Negative for susceptible to infections.  Neurological: Negative for dizziness, numbness, headaches and weakness.  Hematological: Negative for swollen glands.  Psychiatric/Behavioral: Positive for sleep disturbance. Negative for depressed mood. The patient is not nervous/anxious.     PMFS History:  Patient Active Problem List   Diagnosis Date Noted  . Anxiety and depression 06/20/2017  . Annual physical exam 06/18/2017  . Primary osteoarthritis of both knees 05/11/2017  . Primary osteoarthritis of both hands 05/11/2017  . Renal calcinosis 05/11/2017  . Abdominal pain, acute, right lower quadrant 10/05/2016  . Personal history of DVT (deep vein thrombosis) 05/19/2016  . Migraine headache 11/19/2015  . PCP NOTES >>>> 05/24/2015  . Varicose veins with pain 04/25/2014  . Chronic venous insufficiency 04/25/2014  . Endometrial polyp 03/26/2014  . Intermenstrual bleeding 03/26/2014  . History of  breast abscess 02/19/2014  . Chronic mastitis of left breast 12/21/2013  . Urinary incontinence 05/08/2013  . Hyperlipidemia 09/03/2011  . KNEE PAIN, RIGHT, CHRONIC 06/05/2008  . DEPRESSION 04/28/2006  . GALACTORRHEA 04/28/2006  . DECREASED LIBIDO 04/28/2006    Past Medical History:    Diagnosis Date  . Anxiety   . Depression    no med  . Headache(784.0)    otc med prn  . Hyperlipidemia    diet controlled, no meds  . PMDD (premenstrual dysphoric disorder)   . PONV (postoperative nausea and vomiting)    in the past    Family History  Problem Relation Age of Onset  . Heart disease Mother   . Diabetes Other        gm  . High Cholesterol Brother        high TG  . Colon cancer Neg Hx   . Breast cancer Neg Hx   . Esophageal cancer Neg Hx   . Stomach cancer Neg Hx   . Rectal cancer Neg Hx    Past Surgical History:  Procedure Laterality Date  . BLADDER SURGERY  5-30-218  . BREAST BIOPSY  06/19/2011   Procedure: BREAST BIOPSY;  Surgeon: Rolm Bookbinder, MD;  Location: [facility];  Service: General;  Laterality: Right;  Right breast chronic abscess drainage and debridement  . BREAST CYST EXCISION Right   . BREAST DUCTAL SYSTEM EXCISION Left 01/24/2014   Procedure:  LEFT BREAST DUCT EXCISION;  Surgeon: Rolm Bookbinder, MD;  Location: [facility];  Service: General;  Laterality: Left;  . BREAST SURGERY  8/12   right breast biopsy  . CESAREAN SECTION     X 3  . DILATATION & CURETTAGE/HYSTEROSCOPY WITH MYOSURE N/A 05/10/2014   Procedure: DILATATION & CURETTAGE/HYSTEROSCOPY WITH MYOSURE ABLATION,RESECTOSCOPIC POLYPECTOMY;  Surgeon: Terrance Mass, MD;  Location: [facility];  Service: Gynecology;  Laterality: N/A;  Confirmed with Myosure rep to be present.  . INCISE AND DRAIN ABCESS     right breast  . IR EMBO VENOUS NOT HEMORR HEMANG  INC GUIDE ROADMAPPING  06/09/2017  . IR RADIOLOGIST EVAL & MGMT  05/04/2017  . IR RADIOLOGIST EVAL & MGMT  06/29/2017  . TUBAL LIGATION  2004   Social History   Social History Narrative   Original from [place]   [place]: pt, husband, son and G-son         Objective: Vital Signs: BP 97/60 (BP Location: Left Arm, Patient Position: Sitting, Cuff Size: Normal)   Pulse 72   Resp 13   Ht '5\' 2"'$  (1.575 m)   Wt 153 lb  (69.4 kg)   BMI 27.98 kg/m    Physical Exam  Constitutional: She is oriented to person, place, and time. She appears well-developed and well-nourished.  HENT:  Head: Normocephalic and atraumatic.  Eyes: Conjunctivae and EOM are normal.  Neck: Normal range of motion.  Cardiovascular: Normal rate, regular rhythm, normal heart sounds and intact distal pulses.  Pulmonary/Chest: Effort normal and breath sounds normal.  Abdominal: Soft. Bowel sounds are normal.  Lymphadenopathy:    She has no cervical adenopathy.  Neurological: She is alert and oriented to person, place, and time.  Skin: Skin is warm and dry. Capillary refill takes less than 2 seconds.  Psychiatric: She has a normal mood and affect. Her behavior is normal.  Nursing note and vitals reviewed.    Musculoskeletal Exam: C-spine, thoracic spine, lumbar spine good range of motion.  No midline spinal tenderness.  She  has right SI joint tenderness.  She has right trochanteric bursa tenderness.  Shoulder joints, elbow joints, wrist joints, MCPs, PIPs, DIPs good range of motion with no synovitis.  Mild DIP synovial thickening consistent with osteoarthritis.  Hip joints, knee joints, ankle joints, MTPs, PIPs, DIPs good range of motion with no synovitis.  She has mild effusion of her right knee.  Bilateral knee crepitus.  CDAI Exam: No CDAI exam completed.    Investigation: No additional findings. CBC Latest Ref Rng & Units 04/04/2017 10/05/2016 05/02/2015  WBC 4.0 - 10.5 K/uL 9.0 9.1 5.1  Hemoglobin 12.0 - 15.0 g/dL 14.2 13.3 12.6  Hematocrit 36.0 - 46.0 % 40.6 39.3 37.9  Platelets 150 - 400 K/uL 210 229 241   CMP Latest Ref Rng & Units 04/12/2017 04/04/2017 10/16/2016  Glucose 65 - 99 mg/dL - 105(H) 100(H)  BUN 6 - 20 mg/dL - 10 15  Creatinine 0.44 - 1.00 mg/dL - 0.66 0.66  Sodium 135 - 145 mmol/L - 137 139  Potassium 3.5 - 5.1 mmol/L - 3.5 3.9  Chloride 101 - 111 mmol/L - 105 107  CO2 22 - 32 mmol/L - 22 26  Calcium 8.9 - 10.3  mg/dL - 8.7(L) 9.1  Total Protein 6.1 - 8.1 g/dL 6.6 6.9 -  Total Bilirubin 0.3 - 1.2 mg/dL - 0.9 -  Alkaline Phos 38 - 126 U/L - 70 -  AST 15 - 41 U/L - 21 -  ALT 14 - 54 U/L - 21 -    Imaging: No results found.  Speciality Comments: OK to work in appt w/Dr. Estanislado Pandy if patient has flare.    Procedures:  Sacroiliac Joint Inj on 11/10/2017 12:02 PM Indications: pain Details: 27 G 1.5 in needle, posterior approach Medications: 1 mL lidocaine 1 %; 40 mg triamcinolone acetonide 40 MG/ML Aspirate: 0 mL Outcome: tolerated well, no immediate complications Procedure, treatment alternatives, risks and benefits explained, specific risks discussed. Consent was given by the patient. Immediately prior to procedure a time out was called to verify the correct patient, procedure, equipment, support staff and site/side marked as required. Patient was prepped and draped in the usual sterile fashion.     Allergies: Patient has no known allergies.   Assessment / Plan:     Visit Diagnoses: Primary osteoarthritis of both hands: She has mild DIP synovial thickening consistent with osteoarthritis.  Joint protection muscle strengthening were discussed.  She experiences joint stiffness for about 10 minutes in the morning.  She is no synovitis on exam.  Primary osteoarthritis of both knees - Right knee joint with moderate osteoarthritis and moderate chondromalacia patella.:  She has bilateral knee crepitus.  She is discomfort and swelling in her right knee.  Trochanteric bursitis of right hip: She has tenderness of her right trochanteric bursa.  She will be given a handout in Spanish of exercises that she can perform at home.  Effusion of right knee - CRP elevated, ESR normal, RF negative, CCP antibody negative, uric acid normal: She continues to have intermittent swelling in her right knee.  She reports she is also having catching and locking sensation at times.  The swelling and pain are most severe after  working.  She is not having any pain or discomfort at this time due to not working for the past 3 days.  She previously had a cortisone injection in October 2018 which helped with the swelling but did not help with her pain.  She declined a cortisone injection today in the  office.  She is going to continue to ice and use a compression sleeve.  Chronic right SI joint pain: She has tenderness of the right SI joint.  She is also been experiencing symptoms of sciatica.  She requested a cortisone injection to the the office.  She tolerated the procedure well.  Potential side effects were discussed.  She is advised to monitor blood pressure closely following the cortisone injection today.  Positive ANA (antinuclear antibody) - ANA 1:640 nucleolar, C3-C4 normal, ENA negative  Medical conditions are listed as follows:  Renal calcinosis  Personal history of DVT (deep vein thrombosis)  History of depression  History of migraine    Orders: Orders Placed This Encounter  Procedures  . Sacroiliac Joint Inj   No orders of the defined types were placed in this encounter.   Face-to-face time spent with patient was 30 minutes. >50% of time was spent in counseling and coordination of care.  Follow-Up Instructions: Return in about 6 months (around 05/13/2018) for Osteoarthritis.   Ofilia Neas, PA-C   I examined and evaluated the patient with Hazel Sams PA.  Patient had no synovitis on my examination today.  The plan of care was discussed as noted above.  Bo Merino, MD  Note - This record has been created using Editor, commissioning.  Chart creation errors have been sought, but may not always  have been located. Such creation errors do not reflect on  the standard of medical care.

## 2017-11-10 ENCOUNTER — Encounter: Payer: Self-pay | Admitting: Rheumatology

## 2017-11-10 ENCOUNTER — Ambulatory Visit (INDEPENDENT_AMBULATORY_CARE_PROVIDER_SITE_OTHER): Payer: BLUE CROSS/BLUE SHIELD | Admitting: Rheumatology

## 2017-11-10 VITALS — BP 97/60 | HR 72 | Resp 13 | Ht 62.0 in | Wt 153.0 lb

## 2017-11-10 DIAGNOSIS — R768 Other specified abnormal immunological findings in serum: Secondary | ICD-10-CM | POA: Diagnosis not present

## 2017-11-10 DIAGNOSIS — M19042 Primary osteoarthritis, left hand: Secondary | ICD-10-CM | POA: Diagnosis not present

## 2017-11-10 DIAGNOSIS — M7061 Trochanteric bursitis, right hip: Secondary | ICD-10-CM | POA: Diagnosis not present

## 2017-11-10 DIAGNOSIS — Z8669 Personal history of other diseases of the nervous system and sense organs: Secondary | ICD-10-CM

## 2017-11-10 DIAGNOSIS — M170 Bilateral primary osteoarthritis of knee: Secondary | ICD-10-CM | POA: Diagnosis not present

## 2017-11-10 DIAGNOSIS — Z8659 Personal history of other mental and behavioral disorders: Secondary | ICD-10-CM

## 2017-11-10 DIAGNOSIS — M19041 Primary osteoarthritis, right hand: Secondary | ICD-10-CM | POA: Diagnosis not present

## 2017-11-10 DIAGNOSIS — M25461 Effusion, right knee: Secondary | ICD-10-CM | POA: Diagnosis not present

## 2017-11-10 DIAGNOSIS — Z86718 Personal history of other venous thrombosis and embolism: Secondary | ICD-10-CM | POA: Diagnosis not present

## 2017-11-10 DIAGNOSIS — N29 Other disorders of kidney and ureter in diseases classified elsewhere: Secondary | ICD-10-CM

## 2017-11-10 DIAGNOSIS — M533 Sacrococcygeal disorders, not elsewhere classified: Secondary | ICD-10-CM | POA: Diagnosis not present

## 2017-11-10 DIAGNOSIS — G8929 Other chronic pain: Secondary | ICD-10-CM

## 2017-11-10 MED ORDER — TRIAMCINOLONE ACETONIDE 40 MG/ML IJ SUSP
40.0000 mg | INTRAMUSCULAR | Status: AC | PRN
  Administered 2017-11-10: 40 mg via INTRA_ARTICULAR

## 2017-11-10 MED ORDER — LIDOCAINE HCL 1 % IJ SOLN
1.0000 mL | INTRAMUSCULAR | Status: AC | PRN
  Administered 2017-11-10: 1 mL

## 2017-11-10 NOTE — Patient Instructions (Signed)

## 2017-11-24 ENCOUNTER — Other Ambulatory Visit (HOSPITAL_COMMUNITY): Payer: Self-pay | Admitting: Interventional Radiology

## 2017-11-24 DIAGNOSIS — I8392 Asymptomatic varicose veins of left lower extremity: Secondary | ICD-10-CM

## 2018-04-10 IMAGING — CT CT ABD-PELV W/ CM
2 of 5 series · 16 of 46 positions shown, 18 images · IV contrast (ISOVUE)
Comparison: 03/02/2005

CLINICAL DATA: Right lower quadrant abdominal pain. Chills starting
last night.

EXAM:
CT ABDOMEN AND PELVIS WITH CONTRAST
TECHNIQUE: Multidetector CT imaging of the abdomen and pelvis was performed
using the standard protocol following bolus administration of
intravenous contrast.
CONTRAST:  100mL 2ND2YJ-288 IOPAMIDOL (2ND2YJ-288) INJECTION 61%

[Series 2: abd/pel with · axial · 0.71mm/px · z∈[-446,-31]mm · 13 of 95 slices shown, 15 images]
[im 6/95  soft-tissue]
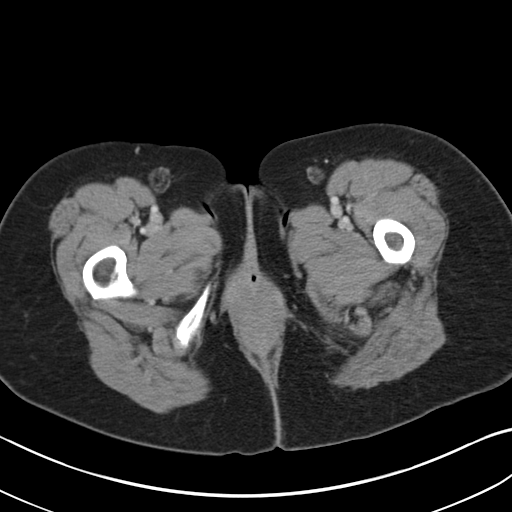
[im 6/95  bone]
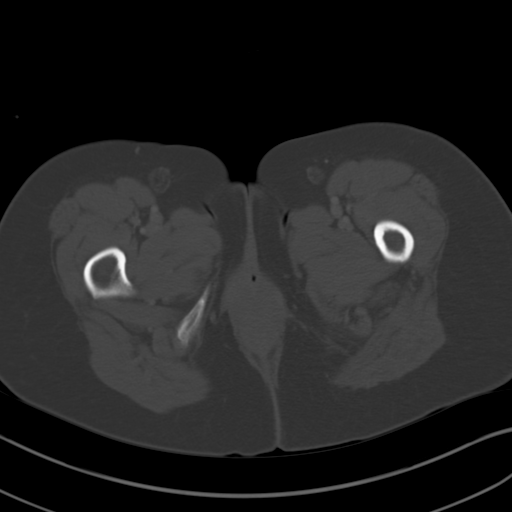
[im 12/95  soft-tissue]
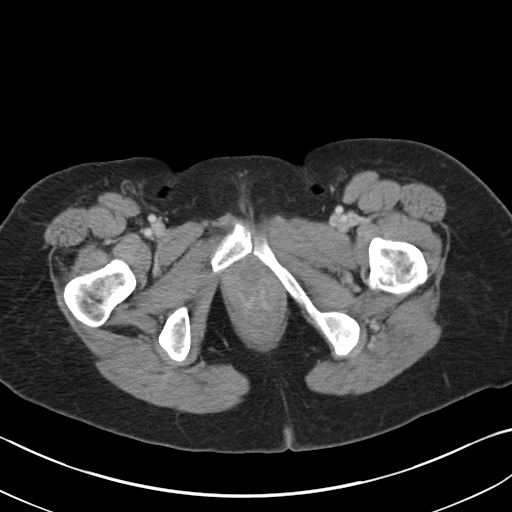
[im 23/95  soft-tissue]
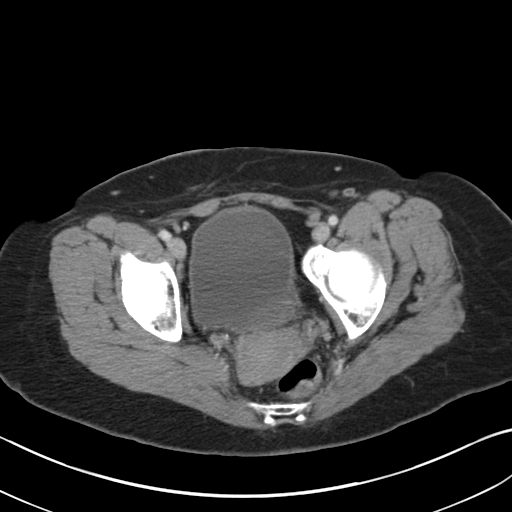
[im 28/95  soft-tissue]
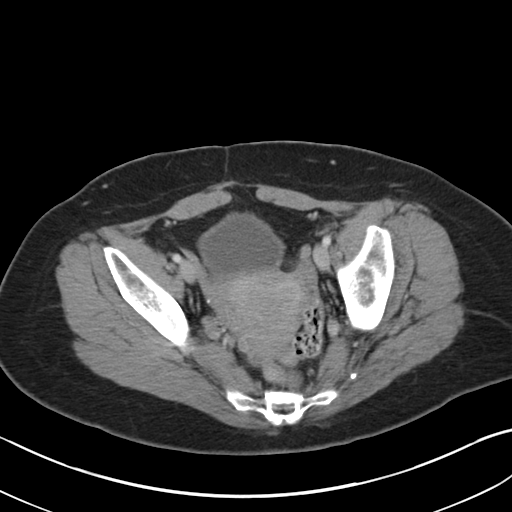
[im 34/95  soft-tissue]
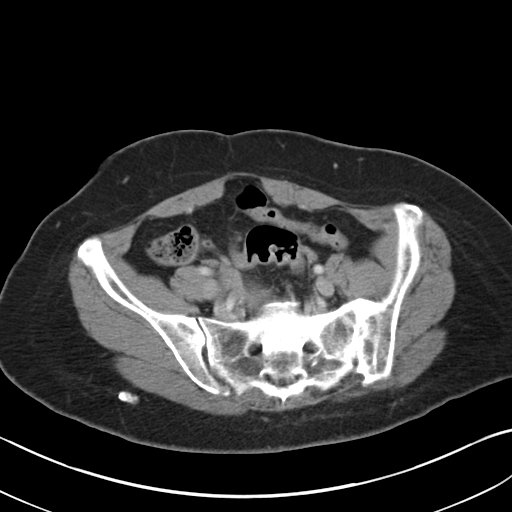
[im 39/95  soft-tissue]
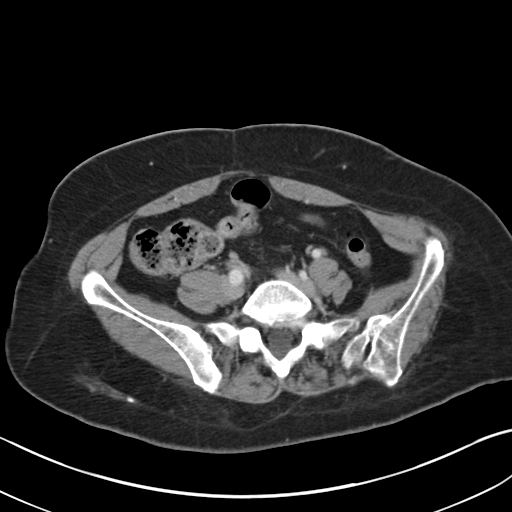
[im 50/95  soft-tissue]
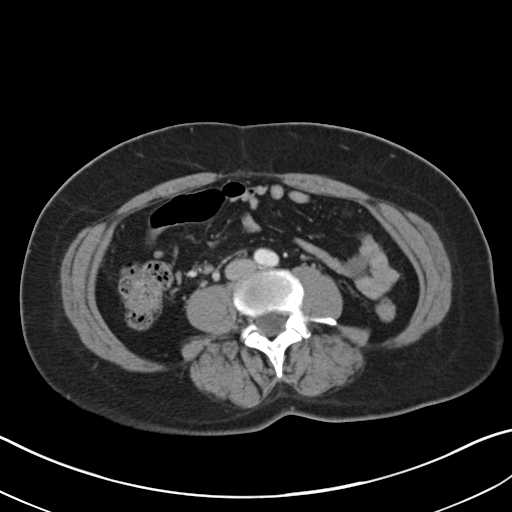
[im 56/95  soft-tissue]
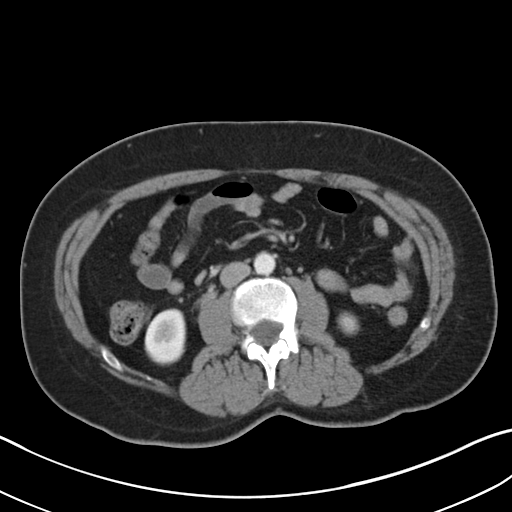
[im 61/95  soft-tissue]
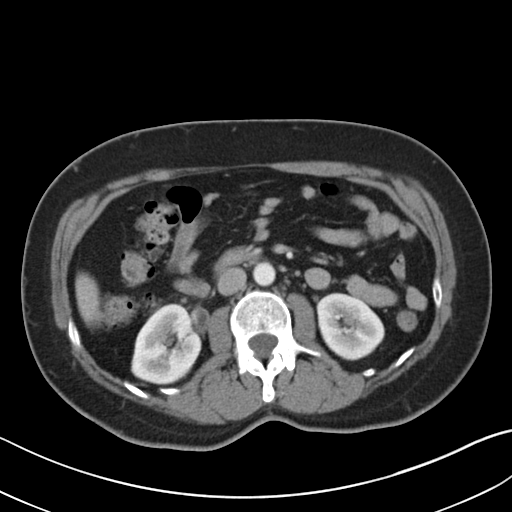
[im 61/95  bone]
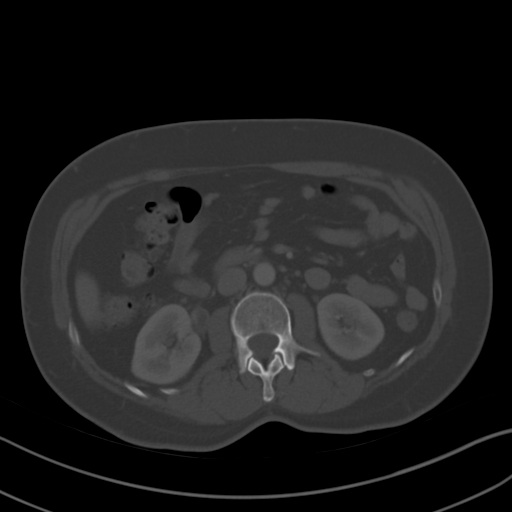
[im 67/95  soft-tissue]
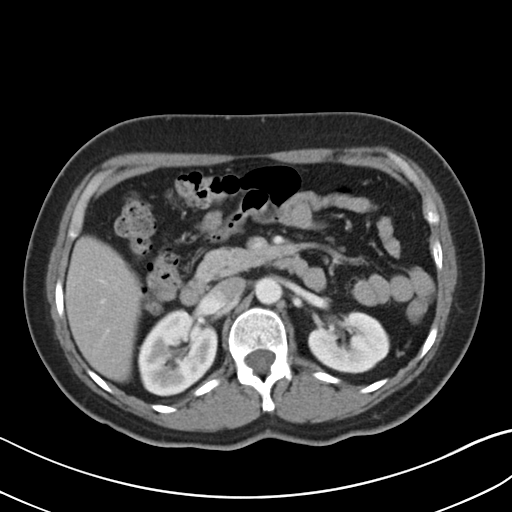
[im 72/95  soft-tissue]
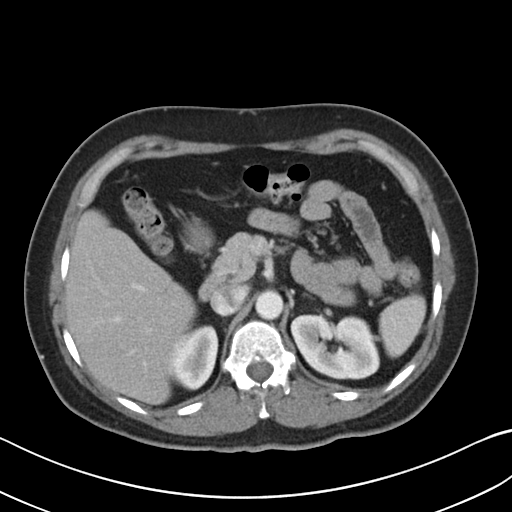
[im 83/95  soft-tissue]
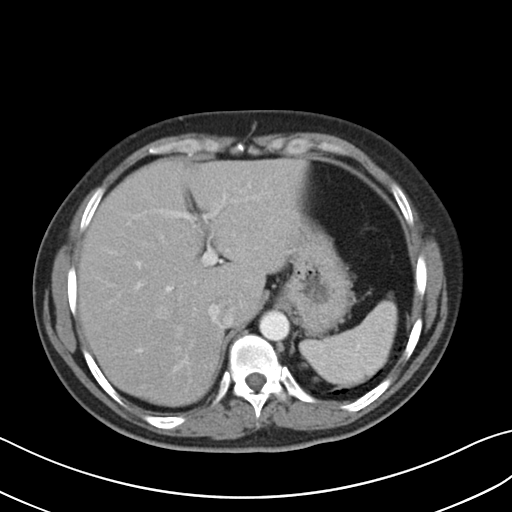
[im 89/95  soft-tissue]
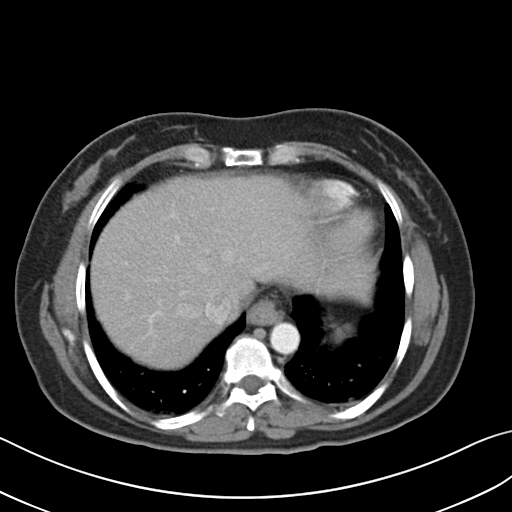

[Series 6: coronal a/|p · coronal · 0.83mm/px · 3 of 117 slices shown]
[im 39/117  soft-tissue]
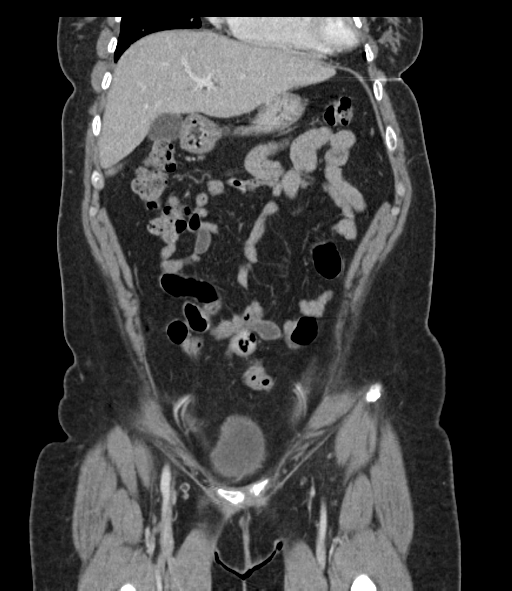
[im 52/117  soft-tissue]
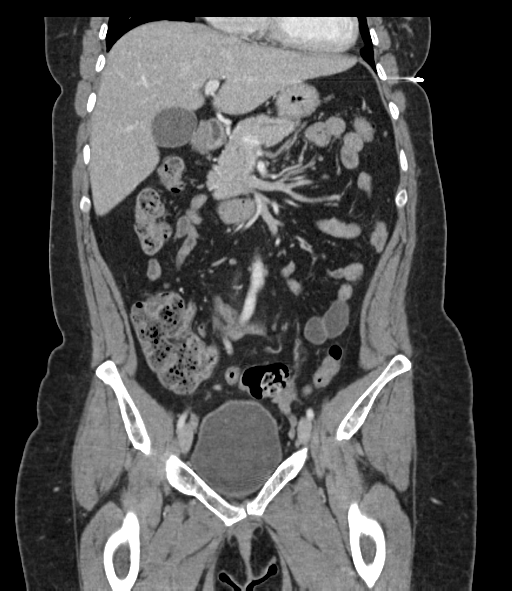
[im 65/117  soft-tissue]
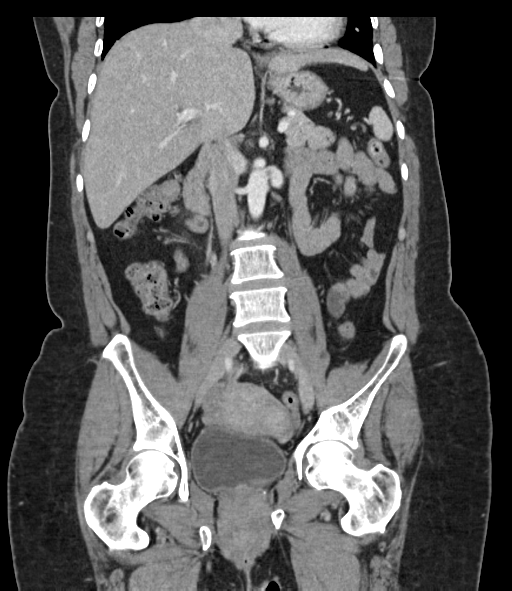

[16 of 46 positions shown; findings below may reference images not displayed]

FINDINGS: Lower chest:  No acute finding.

Hepatobiliary: Small scattered subcentimeter low densities in the
liver are benign-appearing and chronic compared to 8228.No evidence
of biliary obstruction or stone.

Pancreas: Unremarkable.

Spleen: Unremarkable.

Adrenals/Urinary Tract: Negative adrenals. Diffuse asymmetric
urothelial enhancement and thickening involving the right ureter.
Mild intermittent right hydroureter without visible obstructive
process. No evidence of pyelonephritis. No hydronephrosis.
Unremarkable bladder.

Stomach/Bowel:  No obstruction. No appendicitis.

Vascular/Lymphatic: No acute vascular abnormality. No mass or
adenopathy.

Reproductive:No pathologic findings.

Other: No ascites or pneumoperitoneum.

Musculoskeletal: No acute abnormalities. Deformity of the pelvic
ring, likely posttraumatic, with left sacroiliac ankylosis.
IMPRESSION: 1. Urothelial thickening involving the right ureter, likely
infectious ureteritis. No evidence of pyelonephritis. No
hydronephrosis.
2. Normal appendix.

## 2018-05-02 NOTE — Progress Notes (Deleted)
Office Visit Note  Patient: Taylor Parker             Date of Birth: 09/10/1965           MRN: [id number]             PCP: Colon Branch, MD Referring: Colon Branch, MD Visit Date: 05/16/2018 Occupation: @GUAROCC @  Subjective:  No chief complaint on file.   History of Present Illness: Taylor Parker is a 52 y.o. female ***   Activities of Daily Living:  Patient reports morning stiffness for *** {minute/hour:19697}.   Patient {ACTIONS;DENIES/REPORTS:21021675::"Denies"} nocturnal pain.  Difficulty dressing/grooming: {ACTIONS;DENIES/REPORTS:21021675::"Denies"} Difficulty climbing stairs: {ACTIONS;DENIES/REPORTS:21021675::"Denies"} Difficulty getting out of chair: {ACTIONS;DENIES/REPORTS:21021675::"Denies"} Difficulty using hands for taps, buttons, cutlery, and/or writing: {ACTIONS;DENIES/REPORTS:21021675::"Denies"}  No Rheumatology ROS completed.   PMFS History:  Patient Active Problem List   Diagnosis Date Noted  . Anxiety and depression 06/20/2017  . Annual physical exam 06/18/2017  . Primary osteoarthritis of both knees 05/11/2017  . Primary osteoarthritis of both hands 05/11/2017  . Renal calcinosis 05/11/2017  . Abdominal pain, acute, right lower quadrant 10/05/2016  . Personal history of DVT (deep vein thrombosis) 05/19/2016  . Migraine headache 11/19/2015  . PCP NOTES >>>> 05/24/2015  . Varicose veins with pain 04/25/2014  . Chronic venous insufficiency 04/25/2014  . Endometrial polyp 03/26/2014  . Intermenstrual bleeding 03/26/2014  . History of breast abscess 02/19/2014  . Chronic mastitis of left breast 12/21/2013  . Urinary incontinence 05/08/2013  . Hyperlipidemia 09/03/2011  . KNEE PAIN, RIGHT, CHRONIC 06/05/2008  . DEPRESSION 04/28/2006  . GALACTORRHEA 04/28/2006  . DECREASED LIBIDO 04/28/2006    Past Medical History:  Diagnosis Date  . Anxiety   . Depression    no med  . Headache(784.0)    otc med prn  . Hyperlipidemia    diet  controlled, no meds  . PMDD (premenstrual dysphoric disorder)   . PONV (postoperative nausea and vomiting)    in the past    Family History  Problem Relation Age of Onset  . Heart disease Mother   . Diabetes Other        gm  . High Cholesterol Brother        high TG  . Colon cancer Neg Hx   . Breast cancer Neg Hx   . Esophageal cancer Neg Hx   . Stomach cancer Neg Hx   . Rectal cancer Neg Hx    Past Surgical History:  Procedure Laterality Date  . BLADDER SURGERY  5-30-218  . BREAST BIOPSY  06/19/2011   Procedure: BREAST BIOPSY;  Surgeon: Rolm Bookbinder, MD;  Location: [facility];  Service: General;  Laterality: Right;  Right breast chronic abscess drainage and debridement  . BREAST CYST EXCISION Right   . BREAST DUCTAL SYSTEM EXCISION Left 01/24/2014   Procedure:  LEFT BREAST DUCT EXCISION;  Surgeon: Rolm Bookbinder, MD;  Location: [facility];  Service: General;  Laterality: Left;  . BREAST SURGERY  8/12   right breast biopsy  . CESAREAN SECTION     X 3  . DILATATION & CURETTAGE/HYSTEROSCOPY WITH MYOSURE N/A 05/10/2014   Procedure: DILATATION & CURETTAGE/HYSTEROSCOPY WITH MYOSURE ABLATION,RESECTOSCOPIC POLYPECTOMY;  Surgeon: Terrance Mass, MD;  Location: [facility];  Service: Gynecology;  Laterality: N/A;  Confirmed with [employer] rep to be present.  . INCISE AND DRAIN ABCESS     right breast  . IR EMBO VENOUS NOT HEMORR HEMANG  INC GUIDE ROADMAPPING  06/09/2017  . IR RADIOLOGIST  EVAL & MGMT  05/04/2017  . IR RADIOLOGIST EVAL & MGMT  06/29/2017  . TUBAL LIGATION  2004   Social History   Social History Narrative   Original from [place]   [place]: pt, husband, son and G-son        Objective: Vital Signs: There were no vitals taken for this visit.   Physical Exam   Musculoskeletal Exam: ***  CDAI Exam: CDAI Score: Not documented Patient Global Assessment: Not documented; Provider Global Assessment: Not documented Swollen: Not documented; Tender: Not  documented Joint Exam   Not documented   There is currently no information documented on the homunculus. Go to the Rheumatology activity and complete the homunculus joint exam.  Investigation: No additional findings.  Imaging: No results found.  Recent Labs: Lab Results  Component Value Date   WBC 9.0 04/04/2017   HGB 14.2 04/04/2017   PLT 210 04/04/2017   NA 137 04/04/2017   K 3.5 04/04/2017   CL 105 04/04/2017   CO2 22 04/04/2017   GLUCOSE 105 (H) 04/04/2017   BUN 10 04/04/2017   CREATININE 0.66 04/04/2017   BILITOT 0.9 04/04/2017   ALKPHOS 70 04/04/2017   AST 21 04/04/2017   ALT 21 04/04/2017   PROT 6.6 04/12/2017   ALBUMIN 3.7 04/04/2017   CALCIUM 8.7 (L) 04/04/2017   GFRAA >60 04/04/2017   QFTBGOLD NEGATIVE 04/12/2017    Speciality Comments: OK to work in appt w/Dr. Estanislado Pandy if patient has flare.  Procedures:  No procedures performed Allergies: Patient has no known allergies.   Assessment / Plan:     Visit Diagnoses: No diagnosis found.   Orders: No orders of the defined types were placed in this encounter.  No orders of the defined types were placed in this encounter.   Face-to-face time spent with patient was *** minutes. Greater than 50% of time was spent in counseling and coordination of care.  Follow-Up Instructions: No follow-ups on file.   Earnestine Mealing, CMA  Note - This record has been created using Editor, commissioning.  Chart creation errors have been sought, but may not always  have been located. Such creation errors do not reflect on  the standard of medical care.

## 2018-05-16 ENCOUNTER — Ambulatory Visit: Payer: BLUE CROSS/BLUE SHIELD | Admitting: Rheumatology

## 2018-06-28 ENCOUNTER — Encounter: Payer: BLUE CROSS/BLUE SHIELD | Admitting: Internal Medicine

## 2019-02-07 ENCOUNTER — Encounter: Payer: Self-pay | Admitting: Internal Medicine

## 2019-04-05 ENCOUNTER — Encounter: Payer: Self-pay | Admitting: Gynecology

## 2019-12-28 DIAGNOSIS — M1711 Unilateral primary osteoarthritis, right knee: Secondary | ICD-10-CM

## 2019-12-28 HISTORY — DX: Unilateral primary osteoarthritis, right knee: M17.11

## 2020-04-25 ENCOUNTER — Encounter: Payer: Self-pay | Admitting: Nurse Practitioner

## 2020-04-25 ENCOUNTER — Other Ambulatory Visit: Payer: Self-pay

## 2020-04-25 ENCOUNTER — Ambulatory Visit (INDEPENDENT_AMBULATORY_CARE_PROVIDER_SITE_OTHER): Payer: Self-pay | Admitting: Nurse Practitioner

## 2020-04-25 VITALS — BP 122/78 | Ht 62.0 in | Wt 175.0 lb

## 2020-04-25 DIAGNOSIS — R300 Dysuria: Secondary | ICD-10-CM

## 2020-04-25 DIAGNOSIS — R232 Flushing: Secondary | ICD-10-CM

## 2020-04-25 DIAGNOSIS — Z01419 Encounter for gynecological examination (general) (routine) without abnormal findings: Secondary | ICD-10-CM

## 2020-04-25 NOTE — Progress Notes (Signed)
   Taylor Parker [birth year] [id number]   History:  54 y.o. G3P3003 presents for annual exam with complaints of burning with urination. She cannot tell if it is from her urine or vulva. Normal pap and mammogram history. BTL. Did not have a cycle last month and is having occasional hot flashes/night sweats. History of benign biopsy of right breast. Interpreter present during visit.   Gynecologic History Patient's last menstrual period was 01/24/2020. Period Pattern: (!) Irregular Menstrual Flow: Moderate Menstrual Control: Maxi pad, Tampon Dysmenorrhea: (!) Mild Dysmenorrhea Symptoms: Cramping Contraception: tubal ligation Last Pap: 11/31/2018. Results were: normal Last mammogram: 05/27/2017. Results were: normal Last colonoscopy: 07/03/2016. Results were: normal  Past medical history, past surgical history, family history and social history were all reviewed and documented in the EPIC chart.  ROS:  A ROS was performed and pertinent positives and negatives are included.  Exam:  Vitals:   04/25/20 1102  BP: 122/78  Weight: 175 lb (79.4 kg)  Height: 5\' 2"  (1.575 m)   Body mass index is 32.01 kg/m.  General appearance:  Normal Thyroid:  Symmetrical, normal in size, without palpable masses or nodularity. Respiratory  Auscultation:  Clear without wheezing or rhonchi Cardiovascular  Auscultation:  Regular rate, without rubs, murmurs or gallops  Edema/varicosities:  Not grossly evident Abdominal  Soft,nontender, without masses, guarding or rebound.  Liver/spleen:  No organomegaly noted  Hernia:  None appreciated  Skin  Inspection:  Grossly normal   Breasts: Examined lying and sitting.   Right: Without masses, retractions, discharge or axillary adenopathy.   Left: Without masses, retractions, discharge or axillary adenopathy. Gentitourinary   Inguinal/mons:  Normal without inguinal adenopathy  External genitalia:  Normal  BUS/Urethra/Skene's glands:   Normal  Vagina:  Normal  Cervix:  Normal  Uterus:  Normal in size, shape and contour.  Midline and mobile  Adnexa/parametria:     Rt: Without masses or tenderness.   Lt: Without masses or tenderness.  Anus and perineum: Normal  Digital rectal exam: Normal sphincter tone without palpated masses or tenderness  UA negative Wet prep negative  Assessment/Plan:  54 y.o. [id number] for annual exam.   Well female exam with routine gynecological exam - Education provided on SBEs, importance of preventative screenings, current guidelines, high calcium diet, regular exercise, and multivitamin daily. Does not want labs today due to no insurance.   Burning with urination - Plan: Urinalysis,Complete w/RFL Culture. Wet prep and UA unremarkable. Normal exam. Recommend avoiding soaps with fragrance and increasing water intake.   Hot flashes -recommend less clothing and fans at night.  Provided her with over-the-counter options.  Screening for cervical cancer -normal Pap history.  Will repeat at 5-year interval per guidelines.   Screening for breast cancer -normal mammogram history other than right benign breast biopsy.  Overdue for mammogram.  Most recent in 2018.  Provided her with information for [facility] for assistance with free screening mammogram.   Screening for colon cancer -normal colonoscopy in 2017.  Will repeat at their recommended interval.  Follow-up in 1 year for annual.      Tamela [facility], 11:19 AM 04/25/2020

## 2020-04-25 NOTE — Patient Instructions (Addendum)
Mantenimiento de Technical sales engineer en [place] Maintenance, Female Adoptar un estilo de vida saludable y recibir atencin preventiva son importantes para promover la salud y Musician. Consulte al mdico sobre:  El esquema adecuado para hacerse pruebas y exmenes peridicos.  Cosas que puede hacer por su cuenta para prevenir enfermedades y [employer]. Qu debo saber sobre la dieta, el peso y el ejercicio? Consuma una dieta saludable   Consuma una dieta que incluya muchas verduras, frutas, productos lcteos con bajo contenido de [place] y Advertising account planner.  No consuma muchos alimentos ricos en grasas slidas, azcares agregados o sodio. Mantenga un peso saludable El ndice de masa muscular Surgisite [place]) se [place] para identificar problemas de [place]. Proporciona una estimacin de la grasa corporal basndose en el peso y la altura. Su mdico puede ayudarle a Radiation protection practitioner [place] y a Scientist, forensic o Theatre manager un peso saludable. Haga ejercicio con regularidad Haga ejercicio con regularidad. Esta es una de las prcticas ms importantes que puede hacer por su salud. La mayora de los adultos deben seguir estas pautas:  Optometrist, al menos, 185mnutos de actividad fsica por semana. El ejercicio debe aumentar la frecuencia cardaca y hNature conservation officertranspirar (ejercicio de intensidad moderada).  Hacer ejercicios de fortalecimiento por lo mHalliburton Companypor semana. Agregue esto a su plan de ejercicio de intensidad moderada.  Pasar menos tiempo sentados. Incluso la actividad fsica ligera puede ser beneficiosa. Controle sus niveles de colesterol y lpidos en la sangre Comience a realizarse anlisis de lpidos y cResearch officer, trade unionen la sangre a los 20aos y luego reptalos cada 5aos. Hgase controlar los niveles de colesterol con mayor frecuencia si:  Sus niveles de lpidos y colesterol son altos.  Es mayor de 40aos.  Presenta un alto riesgo de padecer enfermedades cardacas. Qu debo saber sobre las pruebas de  deteccin del cncer? Segn su historia clnica y sus antecedentes familiares, es posible que deba realizarse pruebas de deteccin del cncer en diferentes edades. Esto puede incluir pruebas de deteccin de lo siguiente:  Cncer de mama.  Cncer de cuello uterino.  Cncer colorrectal.  Cncer de piel.  Cncer de pulmn. Qu debo saber sobre la enfermedad cardaca, la diabetes y la hipertensin arterial? Presin arterial y enfermedad cardaca  La hipertensin arterial causa enfermedades cardacas y aSerbiael riesgo de accidente cerebrovascular. Es ms probable que esto se manifieste en las personas que tienen lecturas de presin arterial alta, tienen ascendencia africana o tienen sobrepeso.  Hgase controlar la presin arterial: ? Cada 3 a 5 aos si tiene entre 18 y 389aos. ? Todos los aos si es mayor de 4[place] Diabetes Realcese exmenes de deteccin de la diabetes con regularidad. Este anlisis revisa el nivel de azcar en la sangre en a[place]hurst Hgase las pruebas de deteccin:  Cada tresaos despus de los 428aosde edad si tiene un peso normal y un bajo riesgo de padecer diabetes.  Con ms frecuencia y a partir de uSt. Petersedad inferior si tiene sobrepeso o un alto riesgo de padecer diabetes. Qu debo saber sobre la prevencin de infecciones? Hepatitis B Si tiene un riesgo ms alto de contraer hepatitis B, debe someterse a un examen de deteccin de este virus. Hable con el mdico para averiguar si tiene riesgo de contraer la infeccin por hepatitis B. Hepatitis C Se recomienda el anlisis a:  THexion Specialty Chemicals1945 y 1965.  Todas las personas que tengan un riesgo de haber contrado hepatitis C. Enfermedades de transmisin sexual (ETS)  Hgase las  pruebas de deteccin de ITS, incluidas la gonorrea y la clamidia, si: ? Es sexualmente activa y es menor de 67ELF. ? Es mayor de 24aos, y Investment banker, operational informa que corre riesgo de tener este tipo de  infecciones. ? La actividad sexual ha cambiado desde que le hicieron la ltima prueba de deteccin y tiene un riesgo mayor de Best boy clamidia o Radio broadcast assistant. Pregntele al mdico si usted tiene riesgo.  Pregntele al mdico si usted tiene un alto riesgo de Museum/gallery curator VIH. El mdico tambin puede recomendarle un medicamento recetado para ayudar a evitar la infeccin por el VIH. Si elige tomar medicamentos para prevenir el VIH, primero debe [employer] Pride de deteccin del VIH. Luego debe hacerse anlisis cada 66meses mientras est tomando los medicamentos. Embarazo  Si est por dejar de Librarian, academic (fase premenopusica) y usted puede quedar [place], busque asesoramiento antes de [place].  Tome de 400 a [id number] (mcg) de cido [employer]heuser[employer]ch si [place].  Pida mtodos de control de la natalidad (anticonceptivos) si desea evitar un embarazo no deseado. Osteoporosis y [place] La osteoporosis es una enfermedad en la que los huesos pierden los minerales y la fuerza por el avance de la edad. El resultado pueden ser fracturas en los [place]. Si tiene 65aos o ms, o si est en riesgo de sufrir osteoporosis y fracturas, pregunte a su mdico si debe:  Hacerse pruebas de deteccin de prdida sea.  Tomar un suplemento de calcio o de vitamina D para reducir el riesgo de fracturas.  Recibir terapia de reemplazo hormonal (TRH) para tratar los sntomas de la menopausia. Siga estas instrucciones en su casa: Estilo de vida  No consuma ningn producto que contenga nicotina o tabaco, como cigarrillos, cigarrillos electrnicos y tabaco de Higher education careers adviser. Si necesita ayuda para dejar de fumar, consulte al mdico.  No consuma drogas.  No comparta agujas.  Solicite ayuda a su mdico si necesita apoyo o informacin para abandonar las drogas. Consumo de alcohol  No beba alcohol si: ? Su mdico le indica no hacerlo. ? Est embarazada, puede estar embarazada o est tratando de quedar  embarazada.  Si bebe alcohol: ? Limite la cantidad que consume de 0 a 1 medida por da. ? Limite la ingesta si est amamantando.  Est atento a la cantidad de alcohol que hay en las bebidas que toma. En los [place], una medida equivale a una botella de cerveza de 12oz (383ml), un vaso de vino de 5oz (167ml) o un vaso de una bebida alcohlica de alta graduacin de 1oz (41ml). Instrucciones generales  Realcese los estudios de rutina de la salud, dentales y de Public librarian.  [place].  Infrmele a su mdico si: ? Se siente deprimida con frecuencia. ? Alguna vez ha sido vctima de [place] o no se siente segura en su casa. Resumen  Adoptar un estilo de vida saludable y recibir atencin preventiva son importantes para promover la salud y Musician.  Siga las instrucciones del mdico acerca de una dieta saludable, el ejercicio y la realizacin de pruebas o exmenes para Engineer, building services.  Siga las instrucciones del mdico con respecto al control del colesterol y la presin arterial. Esta informacin no tiene Marine scientist el consejo del mdico. Asegrese de hacerle al mdico cualquier pregunta que tenga. Document Revised: 07/20/2018 Document Reviewed: 07/20/2018 Elsevier Patient Education  [place] menopausia Menopause La menopausia es el perodo normal de la vida en el que los perodos  menstruales cesan por completo. Por lo general, se confirma tras 31meses de no haber tenido un perodo menstrual. La transicin a la menopausia (perimenopausia), con mayor frecuencia, ocurre entre los 41 y los 55aos. Durante la perimenopausia, los niveles hormonales cambian en el [place], lo que puede provocar sntomas y Psychologist, occupational. La menopausia podra aumentar el riesgo de sufrir lo siguiente:  Prdida de masa sea (osteoporosis), lo que predispone a la rotura de huesos (fracturas).  Depresin.  Endurecimiento y Public librarian  de las arterias (aterosclerosis), lo que puede causar infartos de miocardio y accidentes cerebrovasculares. Cules son las causas? A menudo, la causa de esta afeccin es un cambio natural en los niveles hormonales, que ocurre a medida que envejece. La afeccin tambin podra ser provocada por una ciruga en la que se extraigan ambos ovarios (ooforectoma bilateral). Qu incrementa el riesgo? Es ms probable que esta afeccin comience a una temprana edad si tiene ciertas afecciones o se realiza ciertos tratamientos, incluidos los siguientes:  Un tumor en la hipfisis del cerebro.  Una enfermedad que afecte los ovarios y la produccin de hormonas.  Radioterapia para tratar Science writer.  Ciertos tratamientos contra el cncer, como quimioterapia o terapia hormonal (antiestrgeno).  Fumar mucho y consumir alcohol de forma excesiva.  Antecedentes familiares de menopausia temprana. Adems, es ms probable que esta afeccin se presente de manera temprana en las mujeres que son [place]. Cules son los signos o los sntomas? Los sntomas de esta afeccin incluyen los siguientes:  Nurse, learning disability.  Perodos menstruales irregulares.  Sudoracin nocturna.  Cambios en el sentimiento respecto de las [employer]. Es posible que el deseo sexual disminuya o que se sienta ms cmoda respecto de su sexualidad.  Sequedad vaginal y adelgazamiento de las paredes de la vagina. Esto podra hacer que sienta dolor durante las relaciones sexuales.  Sequedad de la piel y aparicin de Glass blower/designer.  Dolores de [place].  Problemas para dormir (insomnio).  Cambios de humor o irritabilidad.  Problemas de memoria.  Aumento de [place].  Crecimiento de bello en la cara y el pecho.  Infecciones en la vejiga o problemas para orinar. Cmo se diagnostica? Esta afeccin se diagnostica en funcin de los antecedentes mdicos, un examen fsico, la edad, los antecedentes menstruales y los sntomas. Tambin le  podran realizar estudios hormonales. Cmo se trata? En algunos los casos, no se necesita tratamiento. Usted y el mdico deben decidir juntos si se debe Arts administrator. El tratamiento se determinar en funcin de su cuadro clnico y de sus preferencias. El tratamiento de este cuadro clnico se centra en el control de los sntomas. El tratamiento puede incluir lo siguiente:  Terapia hormonal para la menopausia.  Medicamentos para tratar sntomas o complicaciones especficos.  Acupuntura.  Vitaminas o suplementos herbales. Antes de Biochemist, clinical, es importante que le avise al mdico si tiene antecedentes personales o familiares de lo siguiente:  Enfermedades cardacas.  Cncer de mama.  Cogulos de Industry.  Diabetes.  Osteoporosis. Siga estas indicaciones en su casa: Estilo de vida  No consuma ningn producto que contenga nicotina o tabaco, como cigarrillos y Psychologist, sport and exercise. Si necesita ayuda para dejar de fumar, consulte al mdico.  Realice, por lo menos, [id number] de actividad fsica 5das por semana o ms.  Evite las bebidas con alcohol o cafena, as como las [employer]AM[employer] Corporation. Esto podra ayudar a prevenir los acaloramientos.  Intente dormir de 7 a 8horas todas las noches.  Si tiene acaloramientos: ? Vstase en capas. ? Evite las  cosas que podran desencadenar los acaloramientos, como las comidas muy condimentadas, los lugares calientes o el estrs. ? Respire profundamente y despacio cuando comience a Chief Executive Officer. ? Tenga un ventilador en su casa y en su oficina.  Encuentre modos de USG Corporation, por ejemplo, a travs de la respiracin, meditacin o un diario ntimo.  Considere la posibilidad de asistir a terapia grupal con otras mujeres que tengan sntomas de [place]. [place] recomendaciones al [employer] reuniones de terapia grupal. Comida y bebida  Siga una dieta saludable y equilibrada que incluya cereales  integrales, protenas magras, productos lcteos descremados y [place] frutas y verduras.  El mdico podra recomendarle que agregue una mayor cantidad de soja a su dieta. Algunos de los alimentos que contienen soja son el tofu, el tempeh y [place].  Consuma muchos alimentos que contengan calcio y vitaminaD para Engineer, manufacturing systems salud sea. Algunos productos que contienen mucho calcio son los RadioShack, el yogur, los frijoles, las [place], las sardinas, el brcoli y la col rizada. Medicamentos  Delphi de venta libre y los recetados solamente como se lo haya indicado el mdico.  Hable con el mdico antes de Art gallery manager a tomar cualquier suplemento herbal. [place] vitaminas y los suplementos recetados como se lo haya indicado el mdico. Estos pueden incluir lo siguiente: ? Calcio. Las mujeres de 51aos o ms deben consumir 1200mg  (miligramos) de F[employer]. ? VitaminaD. Las mujeres necesitan entre 600 y 800unidades internacionales de [place]. ? VitaminasB12 yB6. Procure consumir 13microgramos de vitaminaB12 y 1,5mg  de V[employer] Corporation. Instrucciones generales  Lleve un registro de sus perodos menstruales; incluya lo siguiente: ? El momento en que ocurren. ? Qu tan abundantes son y cunto duran. ? Cunto tiempo transcurre entre cada perodo menstrual.  Lleve un registro de los sntomas; anote cundo comienzan, con qu frecuencia ocurren y cunto duran.  Use lubricantes o humectantes vaginales para aliviar la sequedad vaginal y Research officer, political party [place].  Concurra a todas las visitas de control como se lo haya indicado el mdico. Esto es importante. Esto incluye la terapia grupal y la psicoterapia. Comunquese con un mdico si:  An tiene perodos menstruales despus de los 55aos.  Siente dolor durante las [employer].  No tuvo perodos menstruales durante los ltimos 47meses y  presenta sangrado vaginal. Solicite ayuda de inmediato si:  Tiene los siguientes sntomas: ? Depresin grave. ? Sangrado vaginal excesivo. ? Dolor al Continental Airlines. ? Latidos cardacos rpidos o irregulares (palpitaciones). ? Dolor de cabeza intenso. ? Dolor en el abdomen (abdominal) o dispepsia grave.  Se cay y cree que se ha fracturado un hueso.  Siente dolor en el pecho o en la pierna.  Desarrolla problemas de visin.  Se toca un bulto en la mama. Resumen  La menopausia es el perodo normal de la vida en el que los perodos menstruales cesan por completo. Por lo general, se confirma tras 59meses de no haber tenido un perodo menstrual.  La transicin a la menopausia (perimenopausia), con mayor frecuencia, ocurre entre los 63 y los 55aos.  Los sntomas pueden controlarse mediante medicamentos, cambios en el estilo de vida y terapias complementarias, como acupuntura.  Siga una dieta equilibrada que incluya muchos nutrientes para Psychologist, counselling salud sea y la salud cardaca, y para Chief Technology Officer los sntomas durante la [place]. Esta informacin no tiene Marine scientist el consejo del mdico. Asegrese de hacerle al mdico cualquier pregunta que tenga. Document  Revised: 03/25/2017 Document Reviewed: 03/25/2017 Elsevier Patient Education  [place].

## 2020-04-26 LAB — URINALYSIS, COMPLETE W/RFL CULTURE
Bacteria, UA: NONE SEEN /HPF
Bilirubin Urine: NEGATIVE
Glucose, UA: NEGATIVE
Hgb urine dipstick: NEGATIVE
Hyaline Cast: NONE SEEN /LPF
Ketones, ur: NEGATIVE
Leukocyte Esterase: NEGATIVE
Nitrites, Initial: NEGATIVE
Protein, ur: NEGATIVE
RBC / HPF: NONE SEEN /HPF (ref 0–2)
Specific Gravity, Urine: 1.01 (ref 1.001–1.03)
WBC, UA: NONE SEEN /HPF (ref 0–5)
pH: 6 (ref 5.0–8.0)

## 2020-04-26 LAB — WET PREP FOR TRICH, YEAST, CLUE

## 2020-04-26 LAB — NO CULTURE INDICATED

## 2020-08-09 ENCOUNTER — Ambulatory Visit (INDEPENDENT_AMBULATORY_CARE_PROVIDER_SITE_OTHER): Payer: Self-pay | Admitting: Medical

## 2020-08-09 ENCOUNTER — Other Ambulatory Visit: Payer: Self-pay

## 2020-08-09 VITALS — BP 110/54 | HR 62 | Ht 62.0 in | Wt 175.0 lb

## 2020-08-09 DIAGNOSIS — Z124 Encounter for screening for malignant neoplasm of cervix: Secondary | ICD-10-CM

## 2020-08-09 DIAGNOSIS — Z23 Encounter for immunization: Secondary | ICD-10-CM

## 2020-08-09 DIAGNOSIS — Z0000 Encounter for general adult medical examination without abnormal findings: Secondary | ICD-10-CM

## 2020-08-09 DIAGNOSIS — Z1231 Encounter for screening mammogram for malignant neoplasm of breast: Secondary | ICD-10-CM

## 2020-08-09 LAB — CBC WITH DIFFERENTIAL/PLATELET
Basophils Absolute: 0 10*3/uL (ref 0.0–0.1)
Basophils Relative: 0.9 % (ref 0.0–3.0)
Eosinophils Absolute: 0 10*3/uL (ref 0.0–0.7)
Eosinophils Relative: 1 % (ref 0.0–5.0)
HCT: 42.2 % (ref 36.0–46.0)
Hemoglobin: 14 g/dL (ref 12.0–15.0)
Lymphocytes Relative: 41.4 % (ref 12.0–46.0)
Lymphs Abs: 1.7 10*3/uL (ref 0.7–4.0)
MCHC: 33.3 g/dL (ref 30.0–36.0)
MCV: 94.9 fl (ref 78.0–100.0)
Monocytes Absolute: 0.3 10*3/uL (ref 0.1–1.0)
Monocytes Relative: 8 % (ref 3.0–12.0)
Neutro Abs: 2 10*3/uL (ref 1.4–7.7)
Neutrophils Relative %: 48.7 % (ref 43.0–77.0)
Platelets: 256 10*3/uL (ref 150.0–400.0)
RBC: 4.45 Mil/uL (ref 3.87–5.11)
RDW: 13.3 % (ref 11.5–15.5)
WBC: 4 10*3/uL (ref 4.0–10.5)

## 2020-08-09 LAB — COMPREHENSIVE METABOLIC PANEL
ALT: 16 U/L (ref 0–35)
AST: 14 U/L (ref 0–37)
Albumin: 3.9 g/dL (ref 3.5–5.2)
Alkaline Phosphatase: 77 U/L (ref 39–117)
BUN: 11 mg/dL (ref 6–23)
CO2: 29 mEq/L (ref 19–32)
Calcium: 9.2 mg/dL (ref 8.4–10.5)
Chloride: 106 mEq/L (ref 96–112)
Creatinine, Ser: 0.69 mg/dL (ref 0.40–1.20)
GFR: 98.24 mL/min (ref >60.00)
Glucose, Bld: 87 mg/dL (ref 70–99)
Potassium: 4.4 mEq/L (ref 3.5–5.1)
Sodium: 141 mEq/L (ref 135–145)
Total Bilirubin: 0.5 mg/dL (ref 0.2–1.2)
Total Protein: 6.3 g/dL (ref 6.0–8.3)

## 2020-08-09 LAB — LIPID PANEL
Cholesterol: 222 mg/dL — ABNORMAL HIGH (ref 0–200)
HDL: 44 mg/dL (ref >39.00)
LDL Cholesterol: 140 mg/dL — ABNORMAL HIGH (ref 0–99)
NonHDL: 178.45
Total CHOL/HDL Ratio: 5
Triglycerides: 193 mg/dL — ABNORMAL HIGH (ref 0.0–149.0)
VLDL: 38.6 mg/dL (ref 0.0–40.0)

## 2020-08-09 NOTE — Progress Notes (Signed)
Subjective:    Patient ID: Taylor Parker, female    DOB: 1965/10/14, 55 y.o.   MRN: [id number]  HPI  Pt in for first time with me. Former pt of Dr. Larose Kells.   Pt not working. No regular exercise. Pt states moderate healthy. Does not eat out much. Non smoker. No alcohol use. 3 children and married.  3 years since last labs. Pt wants labs/physical.  3 years since last mammogram. Pt had mastitis in past. But negative mammogram per her report.  Pt is due for pap.  Last done 05-2017.  Pt has hx of colon polyp. Report mentioned repeat in 5-10 years. So would be due earliest 06-28-2021. No gi complaints.   On review hx of depression.Below from care everywhere. This is a 55 y.o. female with history of depression who presents to ED for evaluation of headache and suicidal ideation.  Per counselor note- Patient is a 55 y.o., Hispanic, Spanish speaking female who is being seen in psychiatric consult due to depression and passive SI with no plan. Pt stated, "I'm tired I don't feel like living." Pt reported she has been feeling like this off and on for five years, but it's gotten worse in the past few weeks. Pt endorsed current stressors as her relationship with her husband, as well as some of her children recently being deported, which she stated has increased her symptoms of depression. "Today I felt like I wasn't able to deal with the pain that has been in my soul. Almost all the time I'm alone at my house and the only thing that brings me happiness is my grandson." Pt endorsed loneliness, sadness, tearfulness, and anhedonia. She shared, "I think about killing myself but I know it's not good, and I know my kids need me still." Pt denied HI and psychosis. Pt denied any current OP MH treatment, but reported she has particated in OP therapy in the past. Denied any h/o MH hospitalization or suicide attempt. Pt denied any SA issues, although she did report to attending that she drinks alcohol  occasionally. UDS -, BAL 135. Counselor completed this assessment with the help of the [place] # [id number].  Pt seen, Pt sitting in her bed, talked to her with interpreter, Reports has been depressed for about 3 -4 yrs when one of her son was deported, received some counseling at tht time. Reports depression getting worse the last few weeks, pt lives with her husband and youngest son. Pt admits having passive suicidal thoughts , but denies intent, plan, states she wants to live for her children and grandchildren. Denies AVH. Pt willing to take meds, agreeable to start zoloft. Her husband do not have any safety concern of her at this time.  Past Psychiatric History:  see above, hx of depression, no suicide attempt, no inpatient   Pt states she is no longer depressed or anxious. She used sertraline for 4 months and then stopped. Doing well per pt report.     Review of Systems  Constitutional: Negative for chills, fatigue and fever.  HENT: Negative for congestion and drooling.   Respiratory: Negative for cough, chest tightness, shortness of breath and wheezing.   Cardiovascular: Negative for chest pain and palpitations.  Gastrointestinal: Negative for abdominal pain.  Genitourinary: Negative for dysuria and flank pain.  Musculoskeletal: Negative for back pain.  Skin: Negative for rash.  Neurological: Negative for dizziness, speech difficulty, weakness, numbness and headaches.  Hematological: Negative for adenopathy. Does not bruise/bleed easily.  Psychiatric/Behavioral:  Negative for behavioral problems, confusion, decreased concentration, dysphoric mood and hallucinations. The patient is not nervous/anxious.     Past Medical History:  Diagnosis Date  . Anxiety   . Depression    no med  . Headache(784.0)    otc med prn  . Hyperlipidemia    diet controlled, no meds  . PMDD (premenstrual dysphoric disorder)   . PONV (postoperative nausea and vomiting)    in the past      Social History   Socioeconomic History  . Marital status: Married    Spouse name: Not on file  . Number of children: 3  . Years of education: Not on file  . Highest education level: Not on file  Occupational History  . Occupation: [employer]  7pm-4 am  Tobacco Use  . Smoking status: Never Smoker  . Smokeless tobacco: Never Used  Vaping Use  . Vaping Use: Never used  Substance and Sexual Activity  . Alcohol use: No    Alcohol/week: 0.0 standard drinks  . Drug use: No  . Sexual activity: Yes    Partners: Male    Birth control/protection: Surgical    Comment: 1st intercourse- 18, partners- 2, married- 33 yrs   Other Topics Concern  . Not on file  Social History Narrative   Original from [place]   [place]: pt, husband, son and G-son       Social Determinants of Health   Financial Resource Strain: Not on file  Food Insecurity: Not on file  Transportation Needs: Not on file  Physical Activity: Not on file  Stress: Not on file  Social Connections: Not on file  Intimate Partner Violence: Not on file    Past Surgical History:  Procedure Laterality Date  . BLADDER SURGERY  5-30-218  . BREAST BIOPSY  06/19/2011   Procedure: BREAST BIOPSY;  Surgeon: Rolm Bookbinder, MD;  Location: [facility];  Service: General;  Laterality: Right;  Right breast chronic abscess drainage and debridement  . BREAST CYST EXCISION Right   . BREAST DUCTAL SYSTEM EXCISION Left 01/24/2014   Procedure:  LEFT BREAST DUCT EXCISION;  Surgeon: Rolm Bookbinder, MD;  Location: [facility];  Service: General;  Laterality: Left;  . BREAST SURGERY  8/12   right breast biopsy  . CESAREAN SECTION     X 3  . DILATATION & CURETTAGE/HYSTEROSCOPY WITH MYOSURE N/A 05/10/2014   Procedure: DILATATION & CURETTAGE/HYSTEROSCOPY WITH MYOSURE ABLATION,RESECTOSCOPIC POLYPECTOMY;  Surgeon: Terrance Mass, MD;  Location: [facility];  Service: Gynecology;  Laterality: N/A;  Confirmed with Myosure rep to be present.   . INCISE AND DRAIN ABCESS     right breast  . IR EMBO VENOUS NOT HEMORR HEMANG  INC GUIDE ROADMAPPING  06/09/2017  . IR RADIOLOGIST EVAL & MGMT  05/04/2017  . IR RADIOLOGIST EVAL & MGMT  06/29/2017  . TUBAL LIGATION  2004    Family History  Problem Relation Age of Onset  . Heart disease Mother   . Diabetes Other        gm  . High Cholesterol Brother        high TG  . Colon cancer Neg Hx   . Breast cancer Neg Hx   . Esophageal cancer Neg Hx   . Stomach cancer Neg Hx   . Rectal cancer Neg Hx     No Known Allergies  Current Outpatient Medications on File Prior to Visit  Medication Sig Dispense Refill  . meloxicam (MOBIC) 15 MG tablet Take  15 mg by mouth as needed.  (Patient not taking: Reported on 08/09/2020)     No current facility-administered medications on file prior to visit.    BP (!) 110/54   Pulse 62   Ht 5\' 2"  (1.575 m)   Wt 175 lb (79.4 kg)   SpO2 100%   BMI 32.01 kg/m       Objective:   Physical Exam  General Mental Status- Alert. General Appearance- Not in acute distress.   Skin General: Color- Normal Color. Moisture- Normal Moisture.  Neck Carotid Arteries- Normal color. Moisture- Normal Moisture. No carotid bruits. No JVD.  Chest and Lung Exam Auscultation: Breath Sounds:-Normal.  Cardiovascular Auscultation:Rythm- Regular. Murmurs & Other Heart Sounds:Auscultation of the heart reveals- No Murmurs.  Abdomen Inspection:-Inspeection Normal. Palpation/Percussion:Note:No mass. Palpation and Percussion of the abdomen reveal- Non Tender, Non Distended + BS, no rebound or guarding.   Neurologic Cranial Nerve exam:- CN III-XII intact(No nystagmus), symmetric smile. Strength:- 5/5 equal and symmetric strength both upper and lower extremities.      Assessment & Plan:  For you wellness exam today I have ordered cbc, cmp and lipid panel.  Vaccine given today flu vaccine  Recommend exercise and healthy diet.  We will let you know lab  results as they come in.  Refer to gyn for pap.  Mammogram screening placed. [facility] talk [facility] dept down stairs to get scheduled.  On review looks like due for repeat colonoscopy earliest dec 2022. But if gastrointestinal symptoms before can refer sooner.  Follow up date appointment will be determined after lab review.   Glad to hear former depression resolved if return please let us know/re-evaluate.  Mackie Pai, PA-C

## 2020-08-09 NOTE — Addendum Note (Signed)
Addended by: Jeronimo Greaves on: 08/09/2020 09:44 AM   Modules accepted: Orders

## 2020-08-09 NOTE — Patient Instructions (Addendum)
For you wellness exam today I have ordered cbc, cmp and lipid panel.  Vaccine given today flu vaccine  Recommend exercise and healthy diet.  We will let you know lab results as they come in.  Refer to gyn for pap.  Mammogram screening placed. [facility] to get scheduled.  On review looks like due for repeat colonoscopy earliest dec 2022. But if gastrointestinal symptoms before can refer sooner.  Follow up date appointment will be determined after lab review.   Glad to hear former depression resolved if return please let us know/re-evaluate.   Cuidados preventivos en las mujeres de 25 a 55 aos de edad Preventive Care 55-64 Years Old, Female Los cuidados preventivos hacen referencia a las opciones en cuanto al estilo de vida y a las visitas al mdico, las cuales pueden promover la salud y Musician. Esto puede comprender lo siguiente:  Un examen fsico anual. Esto tambin se conoce como visita de control de bienestar anual.  Exmenes dentales y oculares de [place] regular.  Vacunas.  Estudios para Health and safety inspector.  Elecciones para un estilo de vida saludable, por ejemplo: ? Seguir una dieta saludable. ? Practicar actividad fsica con regularidad. ? No consumir drogas ni productos que contengan nicotina y tabaco. ? Limitar el consumo de bebidas alcohlicas. Qu puedo esperar para mi visita de cuidado preventivo? Examen fsico El mdico revisar lo siguiente:  IT consultant y [name]. Estos pueden usarse para calcular el IMC (ndice de masa corporal). El [facility] es una medicin que indica si tiene un peso saludable.  Frecuencia cardaca y presin arterial.  Temperatura corporal.  Piel para detectar manchas anormales. Asesoramiento Su mdico puede preguntarle acerca de:  Problemas mdicos pasados.  Antecedentes mdicos familiares.  Consumo de tabaco, alcohol y drogas.  Su bienestar emocional.  Restaurant manager, fast food y las  relaciones personales.  Su actividad sexual.  Hbitos de alimentacin, ejercicio y sueo.  Su trabajo y [place] laboral.  Acceso a armas de fuego.  Mtodos anticonceptivos.  Ciclo menstrual.  Antecedentes de embarazo. Qu vacunas necesito? Las vacunas se aplican a varias edades, segn un calendario. El [employer] recomendar vacunas segn su edad, sus antecedentes mdicos, su estilo de vida y otros factores, como los viajes o el lugar donde trabaja.   Qu pruebas necesito? Anlisis de [employer] de lpidos y colesterol. Estos se pueden verificar cada 5 aos, o ms a menudo, si usted tiene ms de 65 aos de edad.  Anlisis de hepatitis C.  Anlisis de hepatitis B. Pruebas de deteccin  Pruebas de deteccin de cncer de pulmn. Es posible que se le realice esta prueba de deteccin a partir de los 44 aos de edad, si ha fumado durante 30 aos un paquete diario y sigue fumando o dej el hbito en algn momento en los ltimos 15 aos.  Pruebas de Programme researcher, broadcasting/film/video de Surveyor, minerals. ? Todos los adultos a Proofreader de los 20 aos de edad y [place] 79 aos de edad deben hacerse esta prueba de deteccin. ? El mdico puede recomendarle las pruebas de deteccin a partir de los 39 aos de edad si corre un mayor riesgo. ? Le realizarn pruebas cada 1 a 10 aos, segn los [place] y el tipo de prueba de Programme researcher, broadcasting/film/video.  Pruebas de deteccin de la diabetes. ? Esto se Set designer un control del azcar en la sangre (glucosa) despus de no haber comido durante un periodo de tiempo (ayuno). ? Es posible que se  le realice esta prueba cada 1 a 3 aos.  Mamografa. ? Se puede realizar cada 1 o 2 aos. ? Hable con su mdico sobre cundo debe comenzar a realizarse mamografas de [place] regular. Esto depende de si tiene antecedentes familiares de cncer de mama o no.  Pruebas de deteccin de cncer relacionado con las mutaciones del BRCA. Es posible que se las deba realizar si tiene  antecedentes de cncer de mama, de ovario, de trompas o peritoneal.  Examen plvico y prueba de Papanicolaou. ? Esto se puede realizar cada 74aos a Renato Gails de los 21aos de edad. ? A partir de los 30 aos, esto se puede Optometrist cada 5 aos si usted se realiza una prueba de Papanicolaou en combinacin con una prueba de deteccin del virus del papiloma humano (VPH). Otras pruebas  Pruebas de enfermedades de transmisin sexual (ETS), si est en riesgo.  Densitometra sea. Esto se realiza para detectar osteoporosis. Se le puede realizar este examen de deteccin si tiene un riesgo alto de tener osteoporosis. 55 Hable con su mdico [employer], las opciones de tratamiento y, si corresponde, la necesidad de Optometrist ms pruebas. Siga estas instrucciones en su casa: Comida y bebida  Siga una dieta que incluya frutas y verduras frescas, cereales integrales, protenas magras y productos lcteos descremados.  Tome los suplementos vitamnicos y [employer] se lo haya indicado el mdico.  No beba alcohol si: ? Su mdico le indica no hacerlo. ? Est embarazada, puede estar embarazada o est tratando de quedar embarazada.  Si bebe alcohol: ? Limite la cantidad que consume de 0 a 1 medida por da. ? Est atenta a la cantidad de alcohol que hay en las bebidas que toma. En los [place], una medida equivale a una botella de cerveza de 12oz (343ml), un vaso de vino de 5oz (177ml) o un vaso de una bebida alcohlica de alta graduacin de 1oz (31ml).   Estilo de [employer] y las encas a diario. Cepllese los dientes a la maana y a la noche con pasta dental con fluoruro. Use hilo dental una vez al da.  Mantngase activa. Haga al menos 30 minutos de ejercicio, 5 o ms das cada semana.  No consuma ningn producto que contenga nicotina o tabaco, como cigarrillos, cigarrillos electrnicos y tabaco de Higher education careers adviser. Si necesita ayuda para dejar de fumar, consulte  al mdico.  No consuma drogas.  Si es sexualmente activa, practique sexo seguro. Use un condn u otra forma de proteccin para prevenir las ITS (infecciones de transmisin sexual).  Si no desea quedar embarazada, use un mtodo anticonceptivo. Si busca un embarazo, realice una consulta previa al [employer] con el mdico.  Si el mdico se lo indic, tome una dosis baja de aspirina diariamente a partir de los 44 aos de [place].  Encuentre formas saludables de lidiar con el estrs tales como: ? Meditacin, yoga o escuchar msica. ? Lleve un diario personal. ? Hable con una persona confiable. ? Pase tiempo con amigos y familiares. Seguridad  [place] siempre el cinturn de seguridad al conducir o viajar en un vehculo.  No conduzca: ? Si ha estado bebiendo alcohol. No viaje con un conductor que ha estado bebiendo. ? Si est cansada o distrada. ? Mientras est enviando mensajes de texto.  Use un casco y otros equipos de proteccin [place] deportivas.  Si tiene armas de fuego en su casa, asegrese de seguir todos los procedimientos de seguridad correspondientes. Cundo volver?  Visite al mdico una vez al ao para una visita anual de control de bienestar.  Pregntele al mdico con qu frecuencia debe realizarse un control de la vista y los dientes.  Mantenga su esquema de vacunacin al da. Esta informacin no tiene Marine scientist el consejo del mdico. Asegrese de hacerle al mdico cualquier pregunta que tenga. Document Revised: 05/01/2020 Document Reviewed: 05/01/2020 Elsevier Patient Education  2021 [employer].

## 2020-08-09 NOTE — Addendum Note (Signed)
Addended by: Anabel Halon on: 08/09/2020 09:56 AM   Modules accepted: Orders

## 2020-08-12 ENCOUNTER — Other Ambulatory Visit: Payer: Self-pay | Admitting: Obstetrics and Gynecology

## 2020-08-12 ENCOUNTER — Inpatient Hospital Stay (HOSPITAL_BASED_OUTPATIENT_CLINIC_OR_DEPARTMENT_OTHER): Admission: RE | Admit: 2020-08-12 | Payer: Self-pay | Source: Ambulatory Visit

## 2020-08-12 DIAGNOSIS — Z1231 Encounter for screening mammogram for malignant neoplasm of breast: Secondary | ICD-10-CM

## 2020-09-10 ENCOUNTER — Encounter (INDEPENDENT_AMBULATORY_CARE_PROVIDER_SITE_OTHER): Payer: Self-pay

## 2020-09-10 ENCOUNTER — Other Ambulatory Visit: Payer: Self-pay

## 2020-09-10 ENCOUNTER — Ambulatory Visit: Payer: No Typology Code available for payment source | Admitting: *Deleted

## 2020-09-10 ENCOUNTER — Ambulatory Visit
Admission: RE | Admit: 2020-09-10 | Discharge: 2020-09-10 | Disposition: A | Discharge status: Home / Self Care | Payer: No Typology Code available for payment source | Source: Ambulatory Visit | Attending: Obstetrics and Gynecology | Admitting: Obstetrics and Gynecology

## 2020-09-10 VITALS — BP 130/80 | Wt 175.5 lb

## 2020-09-10 DIAGNOSIS — Z1239 Encounter for other screening for malignant neoplasm of breast: Secondary | ICD-10-CM

## 2020-09-10 DIAGNOSIS — Z1231 Encounter for screening mammogram for malignant neoplasm of breast: Secondary | ICD-10-CM
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
## 2020-09-10 NOTE — Patient Instructions (Signed)
Explained breast self awareness with Wayland Denis. Patient did not need a Pap smear today due to last Pap smear and HPV typing was 05/25/2017. Let her know BCCCP will cover Pap smears and HPV typing every 5 years unless has a history of abnormal Pap smears. Referred patient to the [facility] for a screening mammogram on the mobile unit. Appointment scheduled Tuesday, September 10, 2020 at 1430. Patient escorted to the mobile unit following BCCCP appointment for her screening mammogram. Let patient know the [facility] will follow up with her within the next couple weeks with results of her mammogram by letter or phone. Taylor Parker verbalized understanding.  Winni Ehrhard, Arvil Chaco, RN 1:32 PM

## 2020-09-10 NOTE — Progress Notes (Signed)
[name] is a 55 y.o. female who presents to [facility] clinic today with no complaints.    Pap Smear: Pap smear not completed today. Last Pap smear was 05/25/2017 at [facility] and was normal with negative HPV. Per patient has no history of an abnormal Pap smear. Last Pap smear result is available in Epic.   Physical exam: Breasts Breasts symmetrical. Scars bilateral breasts due to history of benign breast surgeries. Right breast scar observed at 9 o'clock next to areola and left breast scar observed next to areola inner breast. No nipple retraction bilateral breasts. No nipple discharge bilateral breasts. No lymphadenopathy. No lumps palpated bilateral breasts. No complaints of pain or tenderness on exam.       Pelvic/Bimanual Pap is not indicated today per BCCCP guidelines.   Smoking History: Patient has never smoked.   Patient Navigation: Patient education provided. Access to services provided for patient through [facility] program. Spanish interpreter Rudene Anda from [facility] provided.   Colorectal Cancer Screening: Patient has had colonoscopy completed on 07/03/2016. No complaints today.    Breast and Cervical Cancer Risk Assessment: Patient does not have family history of breast cancer, known genetic mutations, or radiation treatment to the chest before age 68. Patient does not have history of cervical dysplasia, immunocompromised, or DES exposure in-utero.  Risk Assessment    Risk Scores      09/10/2020   Last edited by: Demetrius Revel, LPN   5-year risk: 0.8 %   Lifetime risk: 5.8 %          A: BCCCP exam without pap smear No complaints.  P: Referred patient to the [facility] for a screening mammogram on the mobile unit. Appointment scheduled Tuesday, September 10, 2020 at 1430.  Loletta Parish, RN 09/10/2020 1:49 PM   Khara Renaud, Heath Gold, RN 09/10/2020 1:18 PM

## 2020-10-02 ENCOUNTER — Other Ambulatory Visit: Payer: Self-pay

## 2020-10-02 DIAGNOSIS — N6320 Unspecified lump in the left breast, unspecified quadrant: Secondary | ICD-10-CM

## 2020-10-02 DIAGNOSIS — N644 Mastodynia: Secondary | ICD-10-CM

## 2020-10-29 ENCOUNTER — Other Ambulatory Visit: Payer: Self-pay

## 2020-10-29 ENCOUNTER — Ambulatory Visit
Admission: RE | Admit: 2020-10-29 | Discharge: 2020-10-29 | Disposition: A | Discharge status: Home / Self Care | Payer: No Typology Code available for payment source | Source: Ambulatory Visit | Attending: Obstetrics and Gynecology | Admitting: Obstetrics and Gynecology

## 2020-10-29 ENCOUNTER — Ambulatory Visit: Payer: No Typology Code available for payment source

## 2020-10-29 ENCOUNTER — Ambulatory Visit: Payer: Self-pay | Admitting: *Deleted

## 2020-10-29 VITALS — BP 112/76 | Wt 180.1 lb

## 2020-10-29 DIAGNOSIS — N644 Mastodynia: Secondary | ICD-10-CM

## 2020-10-29 DIAGNOSIS — N6320 Unspecified lump in the left breast, unspecified quadrant: Secondary | ICD-10-CM

## 2020-10-29 DIAGNOSIS — Z1239 Encounter for other screening for malignant neoplasm of breast: Secondary | ICD-10-CM

## 2020-10-29 NOTE — Progress Notes (Addendum)
Taylor Parker is a 55 y.o. female who presents to [facility] today with complaint of left breast lump x one month and left breast pain that began after her mammogram 09/10/2020. Patient stated the pain started after having back pain that in her country when someone gives you a big squeeze helps resolve the pain. Patient stated when her husband squeezed her with a big hug that the pain in her left breast and ribs began. Patient stated the pain lasted 15 days and resolved one week ago. Patient rates the pain at a 8 out of 10.    Pap Smear: Pap smear not completed today. [facility].Per patient has no history of an abnormal Pap smear. Last Pap smear resultis available in Epic.   Physical exam: Breasts Breasts symmetrical. Scars bilateral breasts due to history of benign breast surgeries. Right breast scar observed at 9 o'clock next to areola and left breast scar observed next to areola inner breast. No nipple retraction bilateral breasts. No nipple discharge bilateral breasts. No lymphadenopathy. No lumps palpated bilateral breasts. Unable to palpated any lumps in patients area of concern within the left breast. Complaints of left outer breast pain that is greater within the left lower outer breast on exam.  MM DIAG BREAST TOMO UNI LEFT  Result Date: 01/22/2017 CLINICAL DATA:  Short-term followup for probable small cysts in the left breast. These were evaluated in November 2017. EXAM: 2D DIGITAL DIAGNOSTIC LEFT MAMMOGRAM WITH CAD AND ADJUNCT TOMO ULTRASOUND LEFT BREAST COMPARISON:  Previous exam(s). ACR Breast Density Category c: The breast tissue is heterogeneously dense, which may obscure small masses. FINDINGS: The focal opacity seen medially in the left breast on the renal study dated 05/26/2016 is less apparent, and appears as fibroglandular tissue on 3D imaging. There are no discrete  mammographic masses, no areas architectural distortion no suspicious calcifications. Mammographic images were processed with CAD. Targeted ultrasound is performed, showing several small upper outer quadrant left breast cysts. The largest lies in the 10 o'clock position, 3 cm the nipple measuring 6 x 5 x 5 mm. Just peripheral to this at 10 o'clock, 4 cm the nipple, there are 2 small cysts, 1 measuring 6 x 3 x 5 mm in the other 3 mm. There are no solid masses or suspicious lesions. IMPRESSION: 1. No evidence of malignancy. 2. Small benign left breast cysts. No additional short-term follow-up is indicated. RECOMMENDATION: 1. Screening mammogram in November 2018 per normal screening schedule.(Code:SM-B-01Y) I have discussed the findings and recommendations with the patient. Results were also provided in writing at the conclusion of the visit. If applicable, a reminder letter will be sent to the patient regarding the next appointment. BI-RADS CATEGORY  2: Benign. Electronically Signed   By: Lajean Manes M.D.   On: 01/22/2017 15:54   MM DIAG BREAST TOMO UNI LEFT  Result Date: 06/08/2016 CLINICAL DATA:  Patient was called back from screening mammogram for an asymmetry in the left breast. EXAM: 2D DIGITAL DIAGNOSTIC LEFT MAMMOGRAM WITH CAD AND ADJUNCT TOMO ULTRASOUND LEFT BREAST COMPARISON:  Previous exam(s). ACR Breast Density Category c: The breast tissue is heterogeneously dense, which may obscure small masses. FINDINGS: Additional imaging of the left breast was performed. There is an asymmetry seen on the CC view in the medial aspect of the left breast. There are no malignant type microcalcifications or distortion. Mammographic images were processed with CAD. On physical exam, I do not palpate  a mass in the medial aspect of the left breast. Targeted ultrasound is performed, showing 2 adjacent anechoic cysts in the left breast at 10 o'clock 4 cm from the nipple measuring 4 x 2 x 4 mm and 4 x 2 x 4 mm. IMPRESSION:  Probable benign cysts accounting for the mammographic abnormality in the left breast. RECOMMENDATION: Short-term interval follow-up left mammogram and ultrasound 6 months is recommended. I have discussed the findings and recommendations with the patient. Results were also provided in writing at the conclusion of the visit. If applicable, a reminder letter will be sent to the patient regarding the next appointment. BI-RADS CATEGORY  3: Probably benign. Electronically Signed   By: Lillia Mountain M.D.   On: 06/08/2016 09:12   MM SCREENING BREAST TOMO BILATERAL  Result Date: 05/27/2017 CLINICAL DATA:  Screening. EXAM: 2D DIGITAL SCREENING BILATERAL MAMMOGRAM WITH CAD AND ADJUNCT TOMO COMPARISON:  Previous exam(s). ACR Breast Density Category c: The breast tissue is heterogeneously dense, which may obscure small masses. FINDINGS: There are no findings suspicious for malignancy. Images were processed with CAD. IMPRESSION: No mammographic evidence of malignancy. A result letter of this screening mammogram will be mailed directly to the patient. RECOMMENDATION: Screening mammogram in one year. (Code:SM-B-01Y) BI-RADS CATEGORY  1: Negative. Electronically Signed   By: Fidela Salisbury M.D.   On: 05/27/2017 13:21   MM SCREENING BREAST TOMO BILATERAL  Result Date: 05/26/2016 CLINICAL DATA:  Screening. EXAM: 2D DIGITAL SCREENING BILATERAL MAMMOGRAM WITH CAD AND ADJUNCT TOMO COMPARISON:  Previous exam(s). ACR Breast Density Category c: The breast tissue is heterogeneously dense, which may obscure small masses. FINDINGS: In the left breast, a possible asymmetry warrants further evaluation. In the right breast, no findings suspicious for malignancy. Images were processed with CAD. IMPRESSION: Further evaluation is suggested for possible asymmetry in the left breast. RECOMMENDATION: Diagnostic mammogram and possibly ultrasound of the left breast. (Code:FI-L-32M) The patient will be contacted regarding the findings, and  additional imaging will be scheduled. BI-RADS CATEGORY  0: Incomplete. Need additional imaging evaluation and/or prior mammograms for comparison. Electronically Signed   By: Nolon Nations M.D.   On: 05/28/2016 09:16   MS DIGITAL SCREENING TOMO BILATERAL  Result Date: 09/12/2020 CLINICAL DATA:  Screening. EXAM: DIGITAL SCREENING BILATERAL MAMMOGRAM WITH TOMOSYNTHESIS AND CAD TECHNIQUE: Bilateral screening digital craniocaudal and mediolateral oblique mammograms were obtained. Bilateral screening digital breast tomosynthesis was performed. The images were evaluated with computer-aided detection. COMPARISON:  Previous exam(s). ACR Breast Density Category b: There are scattered areas of fibroglandular density. FINDINGS: There are no findings suspicious for malignancy. The images were evaluated with computer-aided detection. IMPRESSION: No mammographic evidence of malignancy. A result letter of this screening mammogram will be mailed directly to the patient. RECOMMENDATION: Screening mammogram in one year. (Code:[id number]-01Y) BI-RADS CATEGORY  1: Negative. Electronically Signed   By: Lajean Manes M.D.   On: 09/12/2020 16:03    Pelvic/Bimanual Pap is not indicated today per BCCCP guidelines.   Smoking History: Patient has never smoked.   Patient Navigation: Patient education provided. Access to services provided for patient through [facility] program. Spanish interpreter Rudene Anda from [facility] provided.   Colorectal Cancer Screening: Patient has had colonoscopy completed on 07/03/2016. No complaints today.    Breast and Cervical Cancer Risk Assessment: Patient does not have family history of breast cancer, known genetic mutations, or radiation treatment to the chest before age 75. Patient does not have history of cervical dysplasia, immunocompromised, or DES exposure in-utero.  Risk Assessment  Risk Scores      10/29/2020 09/10/2020   Last edited by: Royston Bake, CMA [place], Sherie Mamie Nick, LPN    5-year risk: 0.8 % 0.8 %   Lifetime risk: 5.8 % 5.8 %          A: BCCCP exam without pap smear Complaint of left breast pain and lump.  P: Referred patient to the [facility] for a diagnostic mammogram. Appointment scheduled Tuesday, October 29, 2020 at [place].  Loletta Parish, RN 10/29/2020 9:01 AM

## 2020-10-29 NOTE — Patient Instructions (Signed)
Explained breast self awareness with Wayland Denis. Patient did not need a Pap smear today due to last Pap smear and HPV typing was 05/25/2017. Let her know BCCCP will cover Pap smears and HPV typing every 5 years unless has a history of abnormal Pap smears. Referred patient to the [facility] for a diagnostic mammogram. Appointment scheduled Tuesday, October 29, 2020 at [place]. Patient aware of appointment and will be there. Levette Paulick verbalized understanding.  Helix Lafontaine, Arvil Chaco, RN 9:02 AM

## 2021-07-13 HISTORY — PX: OTHER SURGICAL HISTORY: SHX169

## 2021-08-15 HISTORY — PX: COLONOSCOPY: SHX174

## 2021-09-09 ENCOUNTER — Other Ambulatory Visit: Payer: Self-pay

## 2021-09-09 DIAGNOSIS — Z1231 Encounter for screening mammogram for malignant neoplasm of breast: Secondary | ICD-10-CM

## 2021-10-10 ENCOUNTER — Ambulatory Visit (HOSPITAL_COMMUNITY): Payer: No Typology Code available for payment source

## 2021-11-06 ENCOUNTER — Ambulatory Visit
Admission: RE | Admit: 2021-11-06 | Discharge: 2021-11-06 | Disposition: A | Discharge status: Home / Self Care | Payer: No Typology Code available for payment source | Source: Ambulatory Visit | Attending: Obstetrics and Gynecology | Admitting: Obstetrics and Gynecology

## 2021-11-06 ENCOUNTER — Ambulatory Visit: Payer: Self-pay | Admitting: *Deleted

## 2021-11-06 ENCOUNTER — Encounter (INDEPENDENT_AMBULATORY_CARE_PROVIDER_SITE_OTHER): Payer: Self-pay

## 2021-11-06 VITALS — BP 110/74 | Wt 184.3 lb

## 2021-11-06 DIAGNOSIS — Z1231 Encounter for screening mammogram for malignant neoplasm of breast: Secondary | ICD-10-CM

## 2021-11-06 DIAGNOSIS — Z1239 Encounter for other screening for malignant neoplasm of breast: Secondary | ICD-10-CM

## 2021-11-06 NOTE — Progress Notes (Signed)
Taylor Parker is a 56 y.o. female who presents to [facility] clinic today with no complaints.  ?  ?Pap Smear: Pap smear not completed today. Last Pap smear was 05/25/2017 at [facility] clinic and was normal with negative HPV. Per patient has no history of an abnormal Pap smear. Last Pap smear result is available in Epic. ?  ?Physical exam: ?Breasts ?Breasts symmetrical. Scars bilateral breasts due to history of benign breast surgeries. Right breast scar observed at 9 o'clock next to areola and left breast scar observed next to areola inner breast. No nipple retraction bilateral breasts. No nipple discharge bilateral breasts. No lymphadenopathy. No lumps palpated bilateral breasts. No complaints of pain or tenderness on exam. ? ?MM DIAG BREAST TOMO UNI LEFT ? ?Result Date: 01/22/2017 ?CLINICAL DATA:  Short-term followup for probable small cysts in the left breast. These were evaluated in November 2017. EXAM: 2D DIGITAL DIAGNOSTIC LEFT MAMMOGRAM WITH CAD AND ADJUNCT TOMO ULTRASOUND LEFT BREAST COMPARISON:  Previous exam(s). ACR Breast Density Category c: The breast tissue is heterogeneously dense, which may obscure small masses. FINDINGS: The focal opacity seen medially in the left breast on the renal study dated 05/26/2016 is less apparent, and appears as fibroglandular tissue on 3D imaging. There are no discrete mammographic masses, no areas architectural distortion no suspicious calcifications. Mammographic images were processed with CAD. Targeted ultrasound is performed, showing several small upper outer quadrant left breast cysts. The largest lies in the 10 o'clock position, 3 cm the nipple measuring 6 x 5 x 5 mm. Just peripheral to this at 10 o'clock, 4 cm the nipple, there are 2 small cysts, 1 measuring 6 x 3 x 5 mm in the other 3 mm. There are no solid masses or suspicious lesions. IMPRESSION: 1. No evidence of malignancy. 2. Small benign left breast cysts. No additional  short-term follow-up is indicated. RECOMMENDATION: 1. Screening mammogram in November 2018 per normal screening schedule.(Code:SM-B-01Y) I have discussed the findings and recommendations with the patient. Results were also provided in writing at the conclusion of the visit. If applicable, a reminder letter will be sent to the patient regarding the next appointment. BI-RADS CATEGORY  2: Benign. Electronically Signed   By: Lajean Manes M.D.   On: 01/22/2017 15:54  ? ?MM SCREENING BREAST TOMO BILATERAL ? ?Result Date: 05/27/2017 ?CLINICAL DATA:  Screening. EXAM: 2D DIGITAL SCREENING BILATERAL MAMMOGRAM WITH CAD AND ADJUNCT TOMO COMPARISON:  Previous exam(s). ACR Breast Density Category c: The breast tissue is heterogeneously dense, which may obscure small masses. FINDINGS: There are no findings suspicious for malignancy. Images were processed with CAD. IMPRESSION: No mammographic evidence of malignancy. A result letter of this screening mammogram will be mailed directly to the patient. RECOMMENDATION: Screening mammogram in one year. (Code:SM-B-01Y) BI-RADS CATEGORY  1: Negative. Electronically Signed   By: Fidela Salisbury M.D.   On: 05/27/2017 13:21  ? ?MS DIGITAL SCREENING TOMO BILATERAL ? ?Result Date: 09/12/2020 ?CLINICAL DATA:  Screening. EXAM: DIGITAL SCREENING BILATERAL MAMMOGRAM WITH TOMOSYNTHESIS AND CAD TECHNIQUE: Bilateral screening digital craniocaudal and mediolateral oblique mammograms were obtained. Bilateral screening digital breast tomosynthesis was performed. The images were evaluated with computer-aided detection. COMPARISON:  Previous exam(s). ACR Breast Density Category b: There are scattered areas of fibroglandular density. FINDINGS: There are no findings suspicious for malignancy. The images were evaluated with computer-aided detection. IMPRESSION: No mammographic evidence of malignancy. A result letter of this screening mammogram will be mailed directly to the patient. RECOMMENDATION:  Screening mammogram in one  year. (Code:SM-B-01Y) BI-RADS CATEGORY  1: Negative. Electronically Signed   By: Lajean Manes M.D.   On: 09/12/2020 16:03  ? ?MS DIGITAL DIAG TOMO UNI LEFT ? ?Result Date: 10/29/2020 ?CLINICAL DATA:  56 year old female presenting for evaluation of focal pain in the far lateral left chest/lateral chest wall. The patient states that the pain has been gone for a couple of weeks. EXAM: DIGITAL DIAGNOSTIC UNILATERAL LEFT MAMMOGRAM WITH TOMOSYNTHESIS AND CAD TECHNIQUE: Left digital diagnostic mammography and breast tomosynthesis was performed. The images were evaluated with computer-aided detection. COMPARISON:  Previous exam(s). ACR Breast Density Category c: The breast tissue is heterogeneously dense, which may obscure small masses. FINDINGS: No suspicious findings are identified deep to the 2 BBs placed on the skin surface at the site of the prior pain. Physical exam of the lateral chest wall demonstrates no discrete palpable lump. The patient is not expressed discomfort with manual palpation. IMPRESSION: No mammographic findings to explain the patient's prior breast pain. RECOMMENDATION: Return to routine screening mammography is recommended. The patient will be due for screening in March of 2023. I have discussed the findings and recommendations with the patient. If applicable, a reminder letter will be sent to the patient regarding the next appointment. BI-RADS CATEGORY  1: Negative. Electronically Signed   By: Ammie Ferrier M.D.   On: 10/29/2020 09:58   ? ?Pelvic/Bimanual ?Pap is not indicated today per BCCCP guidelines. ?  ?Smoking History: ?Patient has never smoked. ?  ?Patient Navigation: ?Patient education provided. Access to services provided for patient through [place] program. Spanish interpreter Rudene Anda from [facility] provided.  ? ?Colorectal Cancer Screening: ?Patient has had colonoscopy completed on 07/03/2016. No complaints today.  ?  ?Breast and Cervical Cancer Risk  Assessment: ?Patient does not have family history of breast cancer, known genetic mutations, or radiation treatment to the chest before age 9. Patient does not have history of cervical dysplasia, immunocompromised, or DES exposure in-utero. ? ?Risk Assessment   ? ? Risk Scores   ? ?   11/06/2021 10/29/2020  ? Last edited by: Demetrius Revel, LPN Royston Bake, [place]  ? 5-year risk: 0.8 % 0.8 %  ? Lifetime risk: 5.7 % 5.8 %  ? ?  ?  ? ?  ? ? ?A: ?BCCCP exam without pap smear ?No complaints. ? ?P: ?Referred patient to the [place] for a screening mammogram on mobile unit. Appointment scheduled Thursday, November 06, 2021 at 1340. ? ?Loletta Parish, RN ?11/06/2021 12:48 PM   ?

## 2021-11-06 NOTE — Patient Instructions (Signed)
Explained breast self awareness with Wayland Denis. Patient did not need a Pap smear today due to last Pap smear and HPV typing was 05/25/2017. Let her know BCCCP will cover Pap smears and HPV typing every 5 years unless has a history of abnormal Pap smears. Referred patient to the [place] for a screening mammogram on mobile unit. Appointment scheduled Thursday, November 06, 2021 at 1340. Patient aware of appointment and will be there. Let patient know the [facility] will follow up with her within the next couple weeks with results of mammogram by letter or phone. Taylor Parker verbalized understanding. ? ?Obediah Welles, Arvil Chaco, RN ?12:48 PM ? ? ? ? ?

## 2021-11-24 ENCOUNTER — Inpatient Hospital Stay: Payer: Self-pay | Attending: Obstetrics and Gynecology | Admitting: *Deleted

## 2021-11-24 ENCOUNTER — Other Ambulatory Visit: Payer: Self-pay

## 2021-11-24 VITALS — BP 120/78 | Ht 62.5 in | Wt 178.7 lb

## 2021-11-24 DIAGNOSIS — Z0000 Encounter for general adult medical examination without abnormal findings: Secondary | ICD-10-CM

## 2021-11-24 NOTE — Progress Notes (Signed)
[facility] initial screening ?  ?[place], [place] ?  ?Clinical Measurement: There were no vitals filed for this visit. Fasting Labs Drawn Today, will review with patient when they result. ?  ?Medical History:  Patient states that she has high cholesterol, does not have high blood pressure and she does not have diabetes. ? ?Medications:  Patient states that she does not take medication to lower cholesterol, blood pressure or blood sugar.  Patient does not take an aspirin a day to help prevent a heart attack or stroke.  ?  ?Blood pressure, self measurement: Patient states that she does not measure blood pressure from home. She checks her blood pressure N/A. She shares her readings with a health care provider: N/A. ?  ?Nutrition: Patient states that on average she eats 0 cups of fruit and 2 cups of vegetables per day. Patient states that she does eat fish at least 2 times per week. Patient eats about half servings of whole grains. Patient drinks less than 36 ounces of beverages with added sugar weekly: yes. Patient is currently watching sodium or salt intake: yes. In the past 7 days patient has consumed drinks containing alcohol on 0 days. On a day that patient consumes drinks containing alcohol on average 0 drinks are consumed.     ? ?Physical activity:  Patient states that she gets 0 minutes of moderate and 0 minutes of vigorous physical activity each week. ? ?Smoking status:  Patient states that she has has never smoked . ?  ?Quality of life:  Over the past 2 weeks patient states that she had little interest or pleasure in doing things: several days. She has been feeling down, depressed or hopeless:several days.  ?  ?Risk reduction and counseling:  ? ?Health Coaching:  Spoke with patient about the daily recommendation for fruits and vegetables. Showed patient what a serving size would like. Patient consumes oatmeal and whole grain bread. Gave examples of other whole grains that patient can try (brown  rice, whole wheat pasta and whole grain cereals). Patient has been unable to work out regularly due to pain in her knees and pelvis. Patient would like to start walking again. Spoke with patient about different forms of exercise that she could do that would not put stress on her joints. Suggested that she look at some youtube videos of chair exercises. Gave suggestions also of a exercise bike or water aerobics.  ? ? ?Goal: Patient would like to start walking more. Patient would like to try and walk 5 days a week for 40 minutes. Encouraged patient to start off with 2 or 3 days a week and work her way up to 5 days as well as her times that she walks. ?  ?Navigation:  I will notify patient of lab results.  Patient is aware of 2 more health coaching sessions and a follow up. Referred patient to [facility] the [place] for counseling services.  ? ?Time: 25 minutes  ?

## 2021-11-25 LAB — GLUCOSE, RANDOM: Glucose: 87 mg/dL (ref 70–99)

## 2021-11-25 LAB — HEMOGLOBIN A1C
Est. average glucose Bld gHb Est-mCnc: 123 mg/dL
Hgb A1c MFr Bld: 5.9 % — ABNORMAL HIGH (ref 4.8–5.6)

## 2021-11-25 LAB — LIPID PANEL
Chol/HDL Ratio: 5.9 ratio — ABNORMAL HIGH (ref 0.0–4.4)
Cholesterol, Total: 230 mg/dL — ABNORMAL HIGH (ref 100–199)
HDL: 39 mg/dL — ABNORMAL LOW (ref >39)
LDL Chol Calc (NIH): 136 mg/dL — ABNORMAL HIGH (ref 0–99)
Triglycerides: 304 mg/dL — ABNORMAL HIGH (ref 0–149)
VLDL Cholesterol Cal: 55 mg/dL — ABNORMAL HIGH (ref 5–40)

## 2021-12-01 ENCOUNTER — Telehealth: Payer: Self-pay

## 2021-12-01 NOTE — Telephone Encounter (Signed)
Left message for patient via Rudene Anda, Erling Cruz about lab results from [facility]. Left name and number for patient to call back.

## 2021-12-02 ENCOUNTER — Telehealth: Payer: Self-pay

## 2021-12-02 NOTE — Telephone Encounter (Signed)
Health coaching 2   interpreter- Rudene Anda, [place]- 230 cholesterol, 136 LDL cholesterol, 304 triglycerides, 39 HDL cholesterol, 5.9 hemoglobin A1C, 87 mean plasma glucose.  Patient understands and is aware of her lab results.   Goals-  1. Reduce the amount of fried and fatty foods consumed. Try to grill, bake, broil or sautee foods in a small amount of olive oil instead. 2. Reduce the amount of red meats consumed. 3. Increase heart healthy fish and whole grain intake. 4. Try and get 20-30 minutes of exercise daily. 5. Start watching the amount of sweets and sugars consumed.     Navigation:  Patient is aware of 1 more health coaching sessions and a follow up. Patient is scheduled for follow-up with Internal Medicine on Thursday, Dec 04, 2021 @ 9:15 am.   Time-  15 minutes

## 2021-12-04 ENCOUNTER — Ambulatory Visit (INDEPENDENT_AMBULATORY_CARE_PROVIDER_SITE_OTHER): Payer: Self-pay | Admitting: Internal Medicine

## 2021-12-04 DIAGNOSIS — Z7689 Persons encountering health services in other specified circumstances: Secondary | ICD-10-CM | POA: Insufficient documentation

## 2021-12-04 DIAGNOSIS — Z1331 Encounter for screening for depression: Secondary | ICD-10-CM

## 2021-12-04 DIAGNOSIS — R7303 Prediabetes: Secondary | ICD-10-CM

## 2021-12-04 DIAGNOSIS — I83819 Varicose veins of unspecified lower extremities with pain: Secondary | ICD-10-CM

## 2021-12-04 DIAGNOSIS — E785 Hyperlipidemia, unspecified: Secondary | ICD-10-CM

## 2021-12-04 NOTE — Assessment & Plan Note (Signed)
A1C 5.9%. Dietary modifications reviewed. Provided verbal and written education.  -Recommend referral to [facility] for ongoing dietary education once she has insurance.

## 2021-12-04 NOTE — Assessment & Plan Note (Addendum)
Denies feeling depressed despite elevated PHQ 9. Pt wishes to defer addressing of acute issues until she returns with insurance. Recommend repeat PHQ at [facility] and discuss further if it remains elevated

## 2021-12-04 NOTE — Progress Notes (Signed)
Office Visit   Patient ID: Taylor Parker, female    DOB: [birth year], 56 y.o.   MRN: [id number]   PCP: Pcp, No   Subjective:  CC: [facility], establishment of care  Taylor Parker is a 56 y.o. year old female who presents for the above medical condition(s). Please refer to problem based charting for assessment and plan.  Past Medical History:  Diagnosis Date   Anxiety    Depression    no med   Headache(784.0)    otc med prn   Hyperlipidemia    diet controlled, no meds   PMDD (premenstrual dysphoric disorder)    PONV (postoperative nausea and vomiting)    in the past   No current medications.  Family History  Problem Relation Age of Onset   Heart disease Mother    Diabetes Other        gm   High Cholesterol Brother        high TG   Colon cancer Neg Hx    Breast cancer Neg Hx    Esophageal cancer Neg Hx    Stomach cancer Neg Hx    Rectal cancer Neg Hx    Past Surgical History:  Procedure Laterality Date   BLADDER SURGERY  5-30-218   BREAST BIOPSY  06/19/2011   Procedure: BREAST BIOPSY;  Surgeon: Rolm Bookbinder, MD;  Location: [place];  Service: General;  Laterality: Right;  Right breast chronic abscess drainage and debridement   BREAST CYST EXCISION Right    BREAST DUCTAL SYSTEM EXCISION Left 01/24/2014   Procedure:  LEFT BREAST DUCT EXCISION;  Surgeon: Rolm Bookbinder, MD;  Location: [place];  Service: General;  Laterality: Left;   BREAST SURGERY  8/12   right breast biopsy   CESAREAN SECTION     X [place] N/A 05/10/2014   Procedure: [place] POLYPECTOMY;  Surgeon: Terrance Mass, MD;  Location: [place] [facility];  Service: Gynecology;  Laterality: N/A;  Confirmed with [employer] rep to be present.   INCISE AND DRAIN ABCESS     right breast   IR EMBO VENOUS NOT HEMORR HEMANG  INC GUIDE ROADMAPPING  06/09/2017   IR RADIOLOGIST EVAL  & MGMT  05/04/2017   IR RADIOLOGIST EVAL & MGMT  06/29/2017   TUBAL LIGATION  2004    Social History   Socioeconomic History   Marital status: Married    Spouse name: Not on file   Number of children: 3   Years of education: Not on file   Highest education level: 9th grade  Occupational History   Occupation: [employer]  7pm-4 am  Tobacco Use   Smoking status: Never   Smokeless tobacco: Never  Vaping Use   Vaping Use: Never used  Substance and Sexual Activity   Alcohol use: No    Alcohol/week: 0.0 standard drinks   Drug use: No   Sexual activity: Yes    Partners: Male    Birth control/protection: Surgical    Comment: 1st intercourse- 18, partners- 2, married- 40 yrs   Other Topics Concern   Not on file  Social History Narrative   Original from [place]   [place]: pt, husband, son and G-son       Social Determinants of Health   Financial Resource Strain: Not on file  Food Insecurity: No Food Insecurity   Worried About Charity fundraiser in the Last Year: Never true  Ran Out of Food in the Last Year: Never true  Transportation Needs: No Transportation Needs   Lack of Transportation (Medical): No   Lack of Transportation (Non-Medical): No  Physical Activity: Not on file  Stress: Not on file  Social Connections: Not on file  Intimate Partner Violence: Not on file    Objective:   BP (!) 112/52 (BP Location: Right Arm, Patient Position: Sitting, Cuff Size: Normal)   Pulse 60   Temp 98.4 F (36.9 C) (Oral)   Wt 182 lb 4.8 oz (82.7 kg)   LMP 08/31/2020 (Exact Date)   SpO2 96%   BMI 32.81 kg/m   General: well appearing, no distress Cardiac: RRR. Varicose veins present on the left lower leg Pulm: lungs clear Psych: normal mood and affect Assessment & Plan:   Problem List Items Addressed This Visit       Cardiovascular and Mediastinum   Varicose veins with pain    Previously had some kind of surgery in [place] which worked but varicose veins have returned.  Recommend referral to vascular surgery upon her obtaining [employer] as planned.          Other   Hyperlipidemia    10Y ASCVD 2.3%. Dietary recommendations reviewed.  -Recommend referral to [facility] once she has insurance -Statin therapy not indicated at this time       Prediabetes    A1C 5.9%. Dietary modifications reviewed. Provided verbal and written education.  -Recommend referral to [facility] for ongoing dietary education once she has insurance.       Positive screening for depression on 9-item Patient Health Questionnaire (PHQ-9)    Denies feeling depressed despite elevated PHQ 9. Pt wishes to defer addressing of acute issues until she returns with insurance. Recommend repeat PHQ at [facility] and discuss further if it remains elevated       Encounter to establish care    Previously seen at [facility]. ROI signed to request medical records.         She will follow up once she obtains insurance for addressing of acute issues. She will be getting [employer] with her husband in about 4w -Recommend referral to vascular surgery for LLE varicosities with pain -Follow up mood -Follow up left 2nd finger bump -Refer to [facility] for nutrition education -Review records from [facility]   Pt discussed with Dr. Adolm Joseph, MD Internal Medicine Resident PGY-3 [facility] 12/04/2021 11:16 AM

## 2021-12-04 NOTE — Assessment & Plan Note (Addendum)
10Y ASCVD 2.3%. Dietary recommendations reviewed.  -Recommend referral to [facility] once she has insurance -Statin therapy not indicated at this time

## 2021-12-04 NOTE — Assessment & Plan Note (Signed)
Previously seen at [facility]. ROI signed to request medical records.

## 2021-12-04 NOTE — Assessment & Plan Note (Addendum)
Previously had some kind of surgery in [place] which worked but varicose veins have returned. Recommend referral to vascular surgery upon her obtaining [employer] as planned.

## 2021-12-10 NOTE — Progress Notes (Signed)
Internal Medicine Clinic Attending  Case discussed with Dr. Christian  At the time of the visit.  We reviewed the resident's history and exam and pertinent patient test results.  I agree with the assessment, diagnosis, and plan of care documented in the resident's note.  

## 2021-12-21 ENCOUNTER — Other Ambulatory Visit: Payer: Self-pay | Admitting: Internal Medicine

## 2021-12-21 DIAGNOSIS — K635 Polyp of colon: Secondary | ICD-10-CM | POA: Insufficient documentation

## 2021-12-21 DIAGNOSIS — M8588 Other specified disorders of bone density and structure, other site: Secondary | ICD-10-CM

## 2021-12-21 DIAGNOSIS — M8580 Other specified disorders of bone density and structure, unspecified site: Secondary | ICD-10-CM | POA: Insufficient documentation

## 2021-12-21 DIAGNOSIS — K760 Fatty (change of) liver, not elsewhere classified: Secondary | ICD-10-CM

## 2021-12-21 DIAGNOSIS — M159 Polyosteoarthritis, unspecified: Secondary | ICD-10-CM

## 2022-03-24 ENCOUNTER — Other Ambulatory Visit: Payer: Self-pay | Admitting: Hematology and Oncology

## 2022-03-24 ENCOUNTER — Ambulatory Visit
Admission: RE | Admit: 2022-03-24 | Discharge: 2022-03-24 | Disposition: A | Discharge status: Home / Self Care | Payer: No Typology Code available for payment source | Source: Ambulatory Visit | Attending: Obstetrics and Gynecology | Admitting: Obstetrics and Gynecology

## 2022-03-24 DIAGNOSIS — N950 Postmenopausal bleeding: Secondary | ICD-10-CM | POA: Insufficient documentation

## 2022-03-24 DIAGNOSIS — Z124 Encounter for screening for malignant neoplasm of cervix: Secondary | ICD-10-CM

## 2022-03-24 NOTE — Progress Notes (Signed)
Discussed ultrasound findings with patient over the telephone with interpreter Rudene Anda. We discussed her thickened endometrial lining as well as possible fibroid and the need for additional diagnostics including endometrial biopsy. We will wait for Pap smear results to determine location of next appointment. If results are AGUS, she will return to [facility] clinic for procedure. If results negative, she will be referred to gynecology. She verbalizes understanding.

## 2022-03-24 NOTE — Progress Notes (Signed)
Patient: Taylor Parker           Date of Birth: [birth year]           MRN: [id number] Visit Date: 03/24/2022 PCP: Pcp, No     Cervical Exam  Abnormal Observations: New onset vaginal bleeding since June 26. Postmenopausal x 2 years. She reports severe cramping on the first day of bleeding. Cervix appears normal today without active bleeding noted; however, blood noted in the vagina. Pap smear obtained. Last pap 2018 was negative.  Recommendations: Pelvic ultrasound with transvaginal exam today, if possible.       Patient's History Patient Active Problem List   Diagnosis Date Noted   NAFLD (nonalcoholic fatty liver disease) 12/21/2021   Osteopenia 12/21/2021   Colon polyps 12/21/2021   Prediabetes 12/04/2021   Positive screening for depression on 9-item Patient Health Questionnaire (PHQ-9) 12/04/2021   Encounter to establish care 12/04/2021   Osteoarthritis 05/11/2017   Varicose veins with pain 04/25/2014   Chronic venous insufficiency 04/25/2014   Hyperlipidemia 09/03/2011   Past Medical History:  Diagnosis Date   Anxiety    Depression    no med   Headache(784.0)    otc med prn   Hyperlipidemia    diet controlled, no meds   PMDD (premenstrual dysphoric disorder)    PONV (postoperative nausea and vomiting)    in the past    Family History  Problem Relation Age of Onset   Heart disease Mother    Diabetes Other        gm   High Cholesterol Brother        high TG   Colon cancer Neg Hx    Breast cancer Neg Hx    Esophageal cancer Neg Hx    Stomach cancer Neg Hx    Rectal cancer Neg Hx     Social History   Occupational History   Occupation: [employer]  7pm-4 am  Tobacco Use   Smoking status: Never   Smokeless tobacco: Never  Vaping Use   Vaping Use: Never used  Substance and Sexual Activity   Alcohol use: No    Alcohol/week: 0.0 standard drinks of alcohol   Drug use: No   Sexual activity: Yes    Partners: Male    Birth control/protection:  Surgical    Comment: 1st intercourse- 18, partners- 2, married- 48 yrs

## 2022-03-30 LAB — CYTOLOGY - PAP
Adequacy: ABSENT
Comment: NEGATIVE
High risk HPV: NEGATIVE

## 2022-03-31 ENCOUNTER — Telehealth: Payer: Self-pay

## 2022-03-31 ENCOUNTER — Ambulatory Visit: Payer: No Typology Code available for payment source

## 2022-03-31 NOTE — Telephone Encounter (Signed)
Called patient to give pap smear results. Informed patient that pap smear showed LSIL and HPV was negative. Based on this result a colpo is recommended for FU. Explained to patient that we will schedule patient for colpo with our [place] clinic. Patient will be able to have colpo and endometrial bx same day with our provider. Patient voiced understanding. Will call patient with appointment information once scheduled.

## 2022-04-01 ENCOUNTER — Telehealth: Payer: Self-pay

## 2022-04-01 NOTE — Telephone Encounter (Signed)
Health Coaching 3  interpreter- Taylor Parker, [facility]   Goals- Patient has been watching the amount of sugar and carbs that she consumes. Patient has not been exercising recently.    New goal- Try and start doing some short walks (10 minutes). Patient has a friend that has a treadmill that she states that she can use.   Barrier to reaching goal-    Strategies to overcome-    Navigation:  Patient is aware of  a follow up session. Patient is scheduled for FU on Monday, November 6 @ 10:15 am.   Time- 10 minutes

## 2022-04-02 ENCOUNTER — Other Ambulatory Visit: Payer: Self-pay | Admitting: Obstetrics and Gynecology

## 2022-04-02 MED ORDER — MEGESTROL ACETATE 40 MG PO TABS: 40.0000 mg | ORAL_TABLET | Freq: Two times a day (BID) | ORAL | 5 refills | Status: DC

## 2022-04-09 ENCOUNTER — Ambulatory Visit: Payer: Self-pay | Admitting: Hematology and Oncology

## 2022-04-09 VITALS — BP 110/72 | Wt 174.1 lb

## 2022-04-09 DIAGNOSIS — N939 Abnormal uterine and vaginal bleeding, unspecified: Secondary | ICD-10-CM

## 2022-04-09 NOTE — Progress Notes (Signed)
BCCCP patient with history of post-menopausal bleeding x several months. She was sent for pelvic ultrasound which revealed thickened endometrium at 12 mm. Pap smear was also obtained and revealed LSIL. We started her on Megace 40 mg twice daily and this has stopped her bleeding. Today, she presented to clinic for endometrial biopsy. Patient was prepared for pelvic exam, speculum utilized to visualize the cervix. Sound was placed through cervical os, pipelle was then placed and specimen obtained. She tolerated well with minimal bleeding. She will continue Megace until pathology completed. We will notify her by phone of results.

## 2022-04-10 ENCOUNTER — Other Ambulatory Visit (HOSPITAL_COMMUNITY)
Admission: RE | Admit: 2022-04-10 | Discharge: 2022-04-10 | Disposition: A | Discharge status: Home / Self Care | Payer: Self-pay | Source: Ambulatory Visit | Attending: Obstetrics and Gynecology | Admitting: Obstetrics and Gynecology

## 2022-04-10 ENCOUNTER — Other Ambulatory Visit: Payer: Self-pay

## 2022-04-10 DIAGNOSIS — N939 Abnormal uterine and vaginal bleeding, unspecified: Secondary | ICD-10-CM

## 2022-04-10 NOTE — Progress Notes (Signed)
ur

## 2022-04-13 LAB — SURGICAL PATHOLOGY

## 2022-04-13 NOTE — Progress Notes (Signed)
I believe I sent an inbasket message about these results - Offer repeat endometrial biopy with BCCCP  Or   - Refer to GYN clinic for repeat endometrial biopsy in the office or consultation for [facility] in the operating room

## 2022-04-16 ENCOUNTER — Other Ambulatory Visit (HOSPITAL_COMMUNITY)
Admission: RE | Admit: 2022-04-16 | Discharge: 2022-04-16 | Disposition: A | Discharge status: Home / Self Care | Payer: Self-pay | Source: Ambulatory Visit | Attending: Obstetrics and Gynecology | Admitting: Obstetrics and Gynecology

## 2022-04-16 ENCOUNTER — Ambulatory Visit (INDEPENDENT_AMBULATORY_CARE_PROVIDER_SITE_OTHER): Payer: Self-pay | Admitting: Obstetrics and Gynecology

## 2022-04-16 ENCOUNTER — Encounter: Payer: Self-pay | Admitting: Obstetrics and Gynecology

## 2022-04-16 VITALS — BP 102/66 | HR 51 | Ht 62.0 in | Wt 176.0 lb

## 2022-04-16 DIAGNOSIS — N950 Postmenopausal bleeding: Secondary | ICD-10-CM

## 2022-04-16 DIAGNOSIS — R87612 Low grade squamous intraepithelial lesion on cytologic smear of cervix (LGSIL): Secondary | ICD-10-CM

## 2022-04-16 NOTE — Progress Notes (Signed)
56 yo P3 referred from [facility] for colposcopy and endometrial biopsy due to a respective LGSIL on recent pap smear and history of postmenopausal vaginal bleeding. Patient reports onset of menopause 2 years ago. She started experiencing vaginal bleeding in June 2023 initially for a few days only but in August it was daily. She described it as light bleeding with occasional passage of clots. Patient reports complete resolution of vaginal bleeding in the past month with Megace   Blood pressure 102/66, pulse (!) 51, height '5\' 2"'$  (1.575 m), weight 176 lb (79.8 kg), last menstrual period 08/31/2020. GENERAL: Well-developed, well-nourished female in no acute distress.  ABDOMEN: Soft, nontender, nondistended. No organomegaly. PELVIC: Normal external female genitalia. Vagina is pink and rugated.  Normal discharge. Normal appearing cervix. Uterus is normal in size. No adnexal mass or tenderness. Chaperone present during the pelvic exam EXTREMITIES: No cyanosis, clubbing, or edema, 2+ distal pulses.  US PELVIC COMPLETE WITH TRANSVAGINAL  Result Date: 03/24/2022 CLINICAL DATA:  Abnormal postmenopausal vaginal bleeding. History of Caesarean section, uterine ablation and polypectomy in 2015. Adenomyosis on previous pelvic MRI. LMP 08/31/2020. EXAM: TRANSABDOMINAL AND TRANSVAGINAL ULTRASOUND OF PELVIS TECHNIQUE: Both transabdominal and transvaginal ultrasound examinations of the pelvis were performed. Transabdominal technique was performed for global imaging of the pelvis including uterus, ovaries, adnexal regions, and pelvic cul-de-sac. It was necessary to proceed with endovaginal exam following the transabdominal exam to visualize the endometrium and ovaries to better advantage. COMPARISON:  Pelvic ultrasound 10/05/2016. Abdominopelvic CT 04/04/2017 and pelvic MRI 11/06/2016. FINDINGS: Uterus Measurements: 10.2 x 4.6 x 6.0 cm = volume: 147.1 mL. Heterogeneously echogenic right fundal lesion measuring 2.7 x 2.6 x 2.7 cm,  possibly a fibroid or focal adenomyosis. No other myometrial abnormalities are identified. Endometrium Thickness: 12. Mildly heterogeneous without focal mass lesion. The endometrial canal appears divergent in the fundal region, similar to previous MRI. Right ovary Not visualized.  No evidence of adnexal mass. Left ovary Not visualized.  No evidence of adnexal mass. Other findings No abnormal free fluid. IMPRESSION: 1. Abnormal endometrial thickening. In the setting of post-menopausal bleeding, endometrial sampling is indicated to exclude carcinoma. If results are benign, sonohysterogram should be considered for focal lesion work-up. (Ref: Radiological Reasoning: Algorithmic Workup of Abnormal Vaginal Bleeding with Endovaginal Sonography and Sonohysterography. AJR 2008; 818:H63-14) 2. Possible right fundal fibroid versus focal adenomyosis. 3. Nonvisualization of the ovaries. Electronically Signed   By: Richardean Sale M.D.   On: 03/24/2022 15:44    A/P 56 yo with LGSIL on 03/24/22 pap smear and episodes of postmenopausal vaginal bleeding - reviewed the need for endometrial biopsy and colposcopy - All questions were answered - Continue megace  ENDOMETRIAL BIOPSY     The indications for endometrial biopsy were reviewed.   Risks of the biopsy including cramping, bleeding, infection, uterine perforation, inadequate specimen and need for additional procedures  were discussed. The patient states she understands and agrees to undergo procedure today. Consent was signed. Time out was performed. Urine HCG was negative. A sterile speculum was placed in the patient's vagina and the cervix was prepped with Betadine. A single-toothed tenaculum was placed on the anterior lip of the cervix to stabilize it. The uterine cavity was sounded to a depth of 9 cm using the uterine sound. The 3 mm pipelle was introduced into the endometrial cavity without difficulty, 2 passes were made.  A  moderate amount of tissue was  sent to  pathology. The instruments were removed from the patient's vagina. Minimal bleeding from the  cervix was noted. The patient tolerated the procedure well.  Routine post-procedure instructions were given to the patient. The patient will follow up in two weeks to review the results and for further management.   Patient given informed consent, signed copy in the chart, time out was performed.  Placed in lithotomy position. Cervix viewed with speculum and colposcope after application of acetic acid.   Colposcopy adequate?  yes Acetowhite lesions?4-6 o'clock region Punctation?no Mosaicism?  no Abnormal vasculature?  no Biopsies?5 o'clock ECC?yes  COMMENTS: Patient was given post procedure instructions.  She will return in 2 weeks for results.  Mora Bellman, MD

## 2022-04-16 NOTE — Progress Notes (Signed)
New GYN presents for Endo Bx and COLPO.  UPT is Negative.

## 2022-04-17 LAB — SURGICAL PATHOLOGY

## 2022-05-06 ENCOUNTER — Emergency Department (HOSPITAL_COMMUNITY): Payer: No Typology Code available for payment source

## 2022-05-06 ENCOUNTER — Emergency Department (HOSPITAL_COMMUNITY)
Admission: EM | Admit: 2022-05-06 | Discharge: 2022-05-06 | Disposition: A | Discharge status: Home / Self Care | Payer: No Typology Code available for payment source | Attending: Emergency Medicine | Admitting: Emergency Medicine

## 2022-05-06 ENCOUNTER — Encounter (HOSPITAL_COMMUNITY): Payer: Self-pay | Admitting: Emergency Medicine

## 2022-05-06 DIAGNOSIS — N938 Other specified abnormal uterine and vaginal bleeding: Secondary | ICD-10-CM | POA: Insufficient documentation

## 2022-05-06 DIAGNOSIS — N950 Postmenopausal bleeding: Secondary | ICD-10-CM

## 2022-05-06 DIAGNOSIS — R102 Pelvic and perineal pain: Secondary | ICD-10-CM | POA: Insufficient documentation

## 2022-05-06 DIAGNOSIS — F419 Anxiety disorder, unspecified: Secondary | ICD-10-CM | POA: Insufficient documentation

## 2022-05-06 DIAGNOSIS — R9389 Abnormal findings on diagnostic imaging of other specified body structures: Secondary | ICD-10-CM

## 2022-05-06 LAB — CBC WITH DIFFERENTIAL/PLATELET
Abs Immature Granulocytes: 0.02 10*3/uL (ref 0.00–0.07)
Basophils Absolute: 0.1 10*3/uL (ref 0.0–0.1)
Basophils Relative: 1 %
Eosinophils Absolute: 0.1 10*3/uL (ref 0.0–0.5)
Eosinophils Relative: 1 %
HCT: 43.2 % (ref 36.0–46.0)
Hemoglobin: 14.9 g/dL (ref 12.0–15.0)
Immature Granulocytes: 0 %
Lymphocytes Relative: 38 %
Lymphs Abs: 2.8 10*3/uL (ref 0.7–4.0)
MCH: 32.6 pg (ref 26.0–34.0)
MCHC: 34.5 g/dL (ref 30.0–36.0)
MCV: 94.5 fL (ref 80.0–100.0)
Monocytes Absolute: 0.5 10*3/uL (ref 0.1–1.0)
Monocytes Relative: 6 %
Neutro Abs: 3.9 10*3/uL (ref 1.7–7.7)
Neutrophils Relative %: 54 %
Platelets: 208 10*3/uL (ref 150–400)
RBC: 4.57 MIL/uL (ref 3.87–5.11)
RDW: 12.3 % (ref 11.5–15.5)
WBC: 7.3 10*3/uL (ref 4.0–10.5)
nRBC: 0 % (ref 0.0–0.2)

## 2022-05-06 LAB — COMPREHENSIVE METABOLIC PANEL
ALT: 14 U/L (ref 0–44)
AST: 21 U/L (ref 15–41)
Albumin: 3.8 g/dL (ref 3.5–5.0)
Alkaline Phosphatase: 48 U/L (ref 38–126)
Anion gap: 8 (ref 5–15)
BUN: 8 mg/dL (ref 6–20)
CO2: 19 mmol/L — ABNORMAL LOW (ref 22–32)
Calcium: 8.5 mg/dL — ABNORMAL LOW (ref 8.9–10.3)
Chloride: 111 mmol/L (ref 98–111)
Creatinine, Ser: 0.64 mg/dL (ref 0.44–1.00)
GFR, Estimated: >60 mL/min (ref >60)
Glucose, Bld: 86 mg/dL (ref 70–99)
Potassium: 3.8 mmol/L (ref 3.5–5.1)
Sodium: 138 mmol/L (ref 135–145)
Total Bilirubin: 0.5 mg/dL (ref 0.3–1.2)
Total Protein: 6.8 g/dL (ref 6.5–8.1)

## 2022-05-06 MED ORDER — MEDROXYPROGESTERONE ACETATE 10 MG PO TABS: 10.0000 mg | ORAL_TABLET | Freq: Every day | ORAL | 0 refills | Status: DC

## 2022-05-06 MED ORDER — IBUPROFEN 800 MG PO TABS
800.0000 mg | ORAL_TABLET | Freq: Once | ORAL | Status: AC
  Administered 2022-05-06: 800 mg via ORAL
  Filled 2022-05-06: qty 1

## 2022-05-06 NOTE — ED Provider Triage Note (Signed)
Emergency Medicine Provider Triage Evaluation Note  Taylor Parker , a 56 y.o. female  was evaluated in triage.  Pt complains of intermittent vaginal bleeding accompanied with suprapubic tenderness, left lower quadrant tenderness, back pain, and headaches.  Postmenopausal of 1.5 years.  Recently started having vaginal bleeding and the symptoms a few months ago.  Was seen by specialist, started on a hormonal therapy to help stop/reduce the bleeding.  Felt "unwell" when taking this, stopped taking it 04/19/2022.  No new symptoms.  Denies urinary symptoms, vomiting, fevers, dizziness, lightheadedness, or changes in bowel habits.  Review of Systems  Positive:  Negative: See above  Physical Exam  BP (!) 122/55   Pulse 78   Temp (!) 97.4 F (36.3 C) (Oral)   Resp 16   Ht '5\' 2"'$  (1.575 m)   Wt 79.8 kg   LMP 08/31/2020 (Exact Date)   SpO2 100%   BMI 32.18 kg/m  Gen:   Awake, no distress   Resp:  Normal effort  MSK:   Moves extremities without difficulty  Other:  LLQ tenderness, suprapubic tenderness, bilateral back tenderness.  Gaze light appropriately.  PERRLA.  Normal EOMs.  Medical Decision Making  Medically screening exam initiated at 4:21 PM.  Appropriate orders placed.  Keta [place] was informed that the remainder of the evaluation will be completed by another provider, this initial triage assessment does not replace that evaluation, and the importance of remaining in the ED until their evaluation is complete.     Prince Rome, PA-C 07/86/75 1624

## 2022-05-06 NOTE — Discharge Instructions (Signed)
As we discussed, you have thickened endometrium.   I have discussed case with your GYN doctor they recommend Provera 10 mg twice daily.  Please see Dr. Elly Modena as scheduled next week to discuss options  Return to ER if you have worse abdominal pain, uncontrolled bleeding.

## 2022-05-06 NOTE — ED Triage Notes (Signed)
Using interpreter, pt reports vaginal bleeding for 3 months. Endorses back pain and lower back pain and headache.

## 2022-05-06 NOTE — ED Provider Notes (Signed)
[facility] EMERGENCY DEPARTMENT Provider Note   CSN: [id number] Arrival date & time: 05/06/22  1445     History  No chief complaint on file.   Taylor Parker is a 56 y.o. female history of endometrial polyps status post resection, here presenting with vaginal bleeding.  Patient was on Megace and has not been able to tolerate it.  She states that she has very severe depression and mood swings.  She stopped taking about 3 weeks ago.  She states that she has been having vaginal bleeding since then.  She has to change pad several times a day.  She had a biopsy done by Dr. Elly Modena about 3 weeks ago that showed a polyp.  She states that she has appointment on November 2 with Dr. Elly Modena.  She states that since she has more bleeding she is sent here for further evaluation.  The history is provided by the patient.       Home Medications Prior to Admission medications   Medication Sig Start Date End Date Taking? Authorizing Provider  medroxyPROGESTERone (PROVERA) 10 MG tablet Take 1 tablet (10 mg total) by mouth daily. 05/06/22  Yes Drenda Freeze, MD  atorvastatin (LIPITOR) 40 MG tablet Take 20 mg by mouth daily. 08/19/21   [provider]  megestrol (MEGACE) 40 MG tablet Take 1 tablet (40 mg total) by mouth 2 (two) times daily. Can increase to two tablets twice a day in the event of heavy bleeding 04/02/22   Constant, Vickii Chafe, MD      Allergies    Patient has no known allergies.    Review of Systems   Review of Systems  Genitourinary:  Positive for vaginal bleeding.  All other systems reviewed and are negative.   Physical Exam Updated Vital Signs BP (!) 122/55   Pulse 78   Temp (!) 97.4 F (36.3 C) (Oral)   Resp 16   Ht '5\' 2"'$  (1.575 m)   Wt 79.8 kg   LMP 08/31/2020 (Exact Date)   SpO2 100%   BMI 32.18 kg/m  Physical Exam Vitals and nursing note reviewed.  Constitutional:      Comments: Anxious  HENT:     Head: Normocephalic.      Nose: Nose normal.     Mouth/Throat:     Mouth: Mucous membranes are moist.  Eyes:     Extraocular Movements: Extraocular movements intact.     Pupils: Pupils are equal, round, and reactive to light.  Cardiovascular:     Rate and Rhythm: Normal rate and regular rhythm.     Pulses: Normal pulses.     Heart sounds: Normal heart sounds.  Pulmonary:     Effort: Pulmonary effort is normal.     Breath sounds: Normal breath sounds.  Abdominal:     General: Abdomen is flat.     Comments: Mild lower pelvic tenderness  Musculoskeletal:        General: Normal range of motion.     Cervical back: Normal range of motion and neck supple.  Skin:    General: Skin is warm.     Capillary Refill: Capillary refill takes less than 2 seconds.  Neurological:     General: No focal deficit present.     Mental Status: She is alert and oriented to person, place, and time.  Psychiatric:        Mood and Affect: Mood normal.        Behavior: Behavior normal.  ED Results / Procedures / Treatments   Labs (all labs ordered are listed, but only abnormal results are displayed) Labs Reviewed  COMPREHENSIVE METABOLIC PANEL - Abnormal; Notable for the following components:      Result Value   CO2 19 (*)    Calcium 8.5 (*)    All other components within normal limits  CBC WITH DIFFERENTIAL/PLATELET  I-STAT BETA HCG BLOOD, ED ([facility], [facility], [facility] ONLY)    EKG None  Radiology US PELVIC COMPLETE WITH TRANSVAGINAL  Result Date: 05/06/2022 CLINICAL DATA:  Vaginal bleeding with left lower quadrant pain. EXAM: TRANSABDOMINAL AND TRANSVAGINAL ULTRASOUND OF PELVIS TECHNIQUE: Both transabdominal and transvaginal ultrasound examinations of the pelvis were performed. Transabdominal technique was performed for global imaging of the pelvis including uterus, ovaries, adnexal regions, and pelvic cul-de-sac. It was necessary to proceed with endovaginal exam following the transabdominal exam to visualize the endometrium.  COMPARISON:  None Available. FINDINGS: Uterus Measurements: 10.3 x 5.9 x 6.6 cm = volume: 208 mL. Myometrium is diffusely heterogeneous suggesting adenomyosis. Focal 3.7 x 4.2 x 4.4 cm right fundal lesion with vascularity suggestive fibroid, generating mass-effect on the fundal endometrium. Endometrium Thickness: 19 mm. Endometrium is heterogeneous and thickened up to 19 mm. Right ovary Measurements: Not visualized. Left ovary Measurements: Not visualized. Other findings No abnormal free fluid. IMPRESSION: 1. Thickened heterogeneous endometrium measuring up to 19 mm. In the setting of post-menopausal bleeding, endometrial sampling is indicated to exclude carcinoma. If results are benign, sonohysterogram should be considered for focal lesion work-up. (Ref: Radiological Reasoning: Algorithmic Workup of Abnormal Vaginal Bleeding with Endovaginal Sonography and Sonohysterography. AJR 2008; 401:U27-25) 2. Diffusely heterogeneous myometrium suggesting adenomyosis. 3. 4.4 cm right fundal mass with vascularity suggestive of a fibroid. Focal adenomyomatosis also be a consideration. 4. Nonvisualization of the ovaries. Electronically Signed   By: Misty Stanley M.D.   On: 05/06/2022 18:00    Procedures Procedures    Medications Ordered in ED Medications - No data to display  ED Course/ Medical Decision Making/ A&P                           Medical Decision Making Taylor Parker Marlowe Sax is a 56 y.o. female here presenting with pelvic pain and vaginal bleeding.  Patient stopped taking Megace due to mood swings 3 weeks ago.  Patient has endometrial polyps.  Plan to get ultrasound pelvis to rule out mass versus polyp.  We will also get CBC as well  9:16 PM Reviewed patient's labs and independently interpreted imaging studies.  CBC unremarkable.  Ultrasound showed heterogeneous endometrium that is thickened.  I discussed case with Dr. Damita Dunnings who is covering Dr. Elly Modena.  She states that since patient did not  tolerate Megace, he she recommend Provera 10 mg twice daily.  Patient has follow-up with Dr. Elly Modena next week and can discuss further options.   Problems Addressed: Postmenopausal bleeding: acute illness or injury Thickened endometrium: acute illness or injury  Amount and/or Complexity of Data Reviewed Labs: ordered. Decision-making details documented in ED Course. Radiology: ordered and independent interpretation performed. Decision-making details documented in ED Course.  Risk Prescription drug management.    Final Clinical Impression(s) / ED Diagnoses Final diagnoses:  None    Rx / DC Orders ED Discharge Orders          Ordered    medroxyPROGESTERone (PROVERA) 10 MG tablet  Daily        05/06/22 2110  Drenda Freeze, MD 05/06/22 2117

## 2022-05-06 NOTE — ED Notes (Signed)
Pt called 3x no answer  

## 2022-05-06 NOTE — Consult Note (Signed)
   OB/GYN Telephone Consult  Taylor Parker is a 56 y.o. [phone] presenting with ongoing PMB on Megace which she cannot tolerate due to mood swings.    I was called for a consult regarding the care of this patient by [facility]. [facility] and [facility].    The provider had a clinical question regarding an alternative to Megace. .   The provider presented the following relevant clinical information and I performed a chart review on the patient and reviewed available documentation:  She follows with Dr. Elly Modena through [facility] at which time she had a EMB for PMB and colposcopy for LSIL. The bleeding started in June 2023. She had improvement in her bleeding on the Megace but cannot tolerate the emotional side effects so she has not been taking it. Her vitals in the ED are normal. Her hemoglobin is normal at 14.9.   I reviewed her pelvic ultrasound images independently and my findings are: consistent with the report. Her EL is thickened at 39m most liekly consistent with polyp given recent EMB being normal. The fibroid does not appear to communicate with the endometrial cavity in the pictures but in the some of the videos appears most questionable.   BP (!) 122/55   Pulse 78   Temp (!) 97.4 F (36.3 C) (Oral)   Resp 16   Ht '5\' 2"'$  (1.575 m)   Wt 79.8 kg   LMP 08/31/2020 (Exact Date)   SpO2 100%   BMI 32.18 kg/m   Exam- performed by consulting provider   Recommendations:  - Patient can try Provera 10 mg daily as an alternative to Megace but may need to be titrated to higher dose depending on her response. Will do lower dose initially in hopes of helping with mood aspect but still helping with bleeding.  - Pt should follow up as scheduled with Dr. CElly Modenaon 11/2  Thank you for this consult and if additional recommendations are needed please call [phone] the [facility]   I spent approximately 5 minutes  directly consulting with the provider and verbally discussing this case. Additionally 10 minutes minutes was spent performing chart review and documentation.   PRadene Gunning MD Attending [facility]

## 2022-05-06 NOTE — ED Notes (Signed)
Called name 3X no answer

## 2022-05-08 ENCOUNTER — Telehealth: Payer: Self-pay

## 2022-05-08 NOTE — Telephone Encounter (Signed)
Returned call with Spanish Interpreter, no answer, left vm

## 2022-05-11 ENCOUNTER — Telehealth: Payer: Self-pay | Admitting: *Deleted

## 2022-05-11 NOTE — Telephone Encounter (Signed)
Returned TC to pt using Spanish interpretter. Pt needed clarification on dose of Provera. Pt did not tolerate Megace. Pt went to ER on 05/06/22. Dr. Damita Dunnings was consulted by EDP and dose was increased from once daily to twice daily since pt did not tolerate Megace. Dose clarified with pt. Pt verbalized understanding. Pt reports continued vaginal bleeding. Denies dizziness or SOB. Advised pt to seek care if she experiences dizziness or SOB or for emergent symptoms. Pt verbalized understanding and confirms that she will be here for her scheduled appt on 05/14/22.

## 2022-05-14 ENCOUNTER — Encounter: Payer: Self-pay | Admitting: Obstetrics and Gynecology

## 2022-05-14 ENCOUNTER — Ambulatory Visit (INDEPENDENT_AMBULATORY_CARE_PROVIDER_SITE_OTHER): Payer: Self-pay | Admitting: Obstetrics and Gynecology

## 2022-05-14 ENCOUNTER — Other Ambulatory Visit: Payer: Self-pay | Admitting: Obstetrics and Gynecology

## 2022-05-14 VITALS — BP 98/62 | HR 64 | Wt 171.0 lb

## 2022-05-14 DIAGNOSIS — N950 Postmenopausal bleeding: Secondary | ICD-10-CM

## 2022-05-14 NOTE — Progress Notes (Signed)
Pt states she has been bleeding x Oct 8 - heavy bleeding with clots.  Pt is now taking Provera.  Pt states she had bad side effects from Megace.

## 2022-05-14 NOTE — Progress Notes (Signed)
56 yo P3 postmenopausal with persistent AUB. Patient reports no improvement in her vaginal bleeding following a trial of Megace and Provera. She reports changing 2 heavy pads 2-3 times a day and reports passage of clots when using the bathroom. She reports some associated cramping pain. She is without any other complaints  Past Medical History:  Diagnosis Date   Anxiety    Arthritis    Depression    no med   Headache(784.0)    otc med prn   Hyperlipidemia    diet controlled, no meds   PMDD (premenstrual dysphoric disorder)    PONV (postoperative nausea and vomiting)    in the past   Vaginal Pap smear, abnormal    Past Surgical History:  Procedure Laterality Date   BLADDER SURGERY  5-30-218   BREAST BIOPSY  06/19/2011   Procedure: BREAST BIOPSY;  Surgeon: Rolm Bookbinder, MD;  Location: [facility];  Service: General;  Laterality: Right;  Right breast chronic abscess drainage and debridement   BREAST CYST EXCISION Right    BREAST DUCTAL SYSTEM EXCISION Left 01/24/2014   Procedure:  LEFT BREAST DUCT EXCISION;  Surgeon: Rolm Bookbinder, MD;  Location: [facility];  Service: General;  Laterality: Left;   BREAST SURGERY  8/12   right breast biopsy   CESAREAN SECTION     X [place] N/A 05/10/2014   Procedure: [place] POLYPECTOMY;  Surgeon: Terrance Mass, MD;  Location: [facility];  Service: Gynecology;  Laterality: N/A;  Confirmed with [employer] rep to be present.   INCISE AND DRAIN ABCESS     right breast   IR EMBO VENOUS NOT HEMORR HEMANG  INC GUIDE ROADMAPPING  06/09/2017   IR RADIOLOGIST EVAL & MGMT  05/04/2017   IR RADIOLOGIST EVAL & MGMT  06/29/2017   TUBAL LIGATION  2004   Family History  Problem Relation Age of Onset   Heart disease Mother    Diabetes Other        gm   High Cholesterol Brother        high TG   Colon cancer Neg Hx    Breast cancer Neg Hx     Esophageal cancer Neg Hx    Stomach cancer Neg Hx    Rectal cancer Neg Hx    Social History   Tobacco Use   Smoking status: Never   Smokeless tobacco: Never  Vaping Use   Vaping Use: Never used  Substance Use Topics   Alcohol use: No    Alcohol/week: 0.0 standard drinks of alcohol   Drug use: No   ROS See pertinent in HPI. All other systems reviewed and non contributory Blood pressure 98/62, pulse 64, weight 171 lb (77.6 kg), last menstrual period 08/31/2020. GENERAL: Well-developed, well-nourished female in no acute distress.  NEURO: alert and oriented x 3  US PELVIC COMPLETE WITH TRANSVAGINAL  Result Date: 05/06/2022 CLINICAL DATA:  Vaginal bleeding with left lower quadrant pain. EXAM: TRANSABDOMINAL AND TRANSVAGINAL ULTRASOUND OF PELVIS TECHNIQUE: Both transabdominal and transvaginal ultrasound examinations of the pelvis were performed. Transabdominal technique was performed for global imaging of the pelvis including uterus, ovaries, adnexal regions, and pelvic cul-de-sac. It was necessary to proceed with endovaginal exam following the transabdominal exam to visualize the endometrium. COMPARISON:  None Available. FINDINGS: Uterus Measurements: 10.3 x 5.9 x 6.6 cm = volume: 208 mL. Myometrium is diffusely heterogeneous suggesting adenomyosis. Focal 3.7 x 4.2 x 4.4 cm right  fundal lesion with vascularity suggestive fibroid, generating mass-effect on the fundal endometrium. Endometrium Thickness: 19 mm. Endometrium is heterogeneous and thickened up to 19 mm. Right ovary Measurements: Not visualized. Left ovary Measurements: Not visualized. Other findings No abnormal free fluid. IMPRESSION: 1. Thickened heterogeneous endometrium measuring up to 19 mm. In the setting of post-menopausal bleeding, endometrial sampling is indicated to exclude carcinoma. If results are benign, sonohysterogram should be considered for focal lesion work-up. (Ref: Radiological Reasoning: Algorithmic Workup of  Abnormal Vaginal Bleeding with Endovaginal Sonography and Sonohysterography. AJR 2008; 518:A41-66) 2. Diffusely heterogeneous myometrium suggesting adenomyosis. 3. 4.4 cm right fundal mass with vascularity suggestive of a fibroid. Focal adenomyomatosis also be a consideration. 4. Nonvisualization of the ovaries. Electronically Signed   By: Misty Stanley M.D.   On: 05/06/2022 18:00     A/P 56 yo with postmenopausal vaginal bleeding - Patient with previous negative endometrial biopsy suggestive of the presence of a polyp - Discussed D&C hysteroscopy, polypectomy and endometrial ablation. Risks, benefits and alternatives were explained including but not limited to risks of bleeding, infection, uterine perforation and damage to adjacent organs. Patient verbalized understanding and all questions were answered - Patient will be scheduled for these outpatient procedures - Patient is opting to discontinue provera in the meantime

## 2022-05-14 NOTE — Addendum Note (Signed)
Addended by: Mora Bellman on: 05/14/2022 10:38 AM   Modules accepted: Orders

## 2022-05-18 ENCOUNTER — Inpatient Hospital Stay: Payer: Self-pay | Attending: Obstetrics and Gynecology | Admitting: *Deleted

## 2022-05-18 VITALS — BP 108/70 | Ht 62.5 in | Wt 170.0 lb

## 2022-05-18 DIAGNOSIS — Z0000 Encounter for general adult medical examination without abnormal findings: Secondary | ICD-10-CM

## 2022-05-18 NOTE — Progress Notes (Signed)
[facility] follow up   Interpreter: Rudene Anda, [employer]   Clinical Measurement:   Vitals:   05/18/22 1024  BP: 102/70      Medical History:  Patient states that she  does not know if she has  high cholesterol, does not have high blood pressure and she does not have diabetes.  Medications:  Patient states that she does not take medication to lower cholesterol, blood pressure and blood sugar.  Patient does not take an aspirin a day to help prevent a heart attack or stroke.    Blood pressure, self measurement: Patient states that she does not measure blood pressure from home. She checks her blood pressure N/A. She shares her readings with a health care provider: N/A.   Nutrition: Patient states that on average she eats 0 cups of fruit and 1 cups of vegetables per day. Patient states that she does not eat fish at least 2 times per week. Patient eats less than half servings of whole grains. Patient drinks less than 36 ounces of beverages with added sugar weekly: yes. Patient is currently watching sodium or salt intake: yes. In the past 7 days patient has had 0 drinks containing alcohol. On average patient drinks 0 drinks containing alcohol per day.      Physical activity:  Patient states that she gets 0 minutes of moderate and 0 minutes of vigorous physical activity each week.  Smoking status:  Patient states that she has has never smoked .   Quality of life:  Over the past 2 weeks patient states that she had little interest or pleasure in doing things: nearly everyday. She has been feeling down, depressed or hopeless:nearly everyday.    Risk reduction and counseling:   Health Coaching: Patient has been dealing with GYN health related issues over the past several months. Because of these issues patient has been unable to exercise. Patient has not had an appetite recently because of the mediations she has been taking. Encouraged patient to still have 3 small meals throughout the day even if she  is not hungry to keep her body fueled. Spoke with patient about increasing the amount of green leafy vegetables that she consumes to help with iron supply.    Navigation: This was the  follow up session for this patient, I will check up on her progress in the coming months.  Time: 20 minutes

## 2022-05-27 ENCOUNTER — Telehealth: Payer: Self-pay | Admitting: Emergency Medicine

## 2022-05-27 NOTE — Telephone Encounter (Signed)
RC to patient with interpreter. Pt states that was calling for an appointment, but her provider did not have any openings. States that she has no further questions at this time.

## 2022-06-03 ENCOUNTER — Telehealth: Payer: Self-pay | Admitting: Medical

## 2022-06-03 NOTE — Telephone Encounter (Signed)
Pt called wanting to know if possible to re-establish with [facility], pt had stop coming to PCP due to had no insurance coverage anymore, but pt states is willing to pay for her visits, if ok with provider to re-establish. Please Advise. Pt tel [phone]

## 2022-06-03 NOTE — Telephone Encounter (Signed)
Called pt and scheduled new pt appt in Dec. Done

## 2022-06-11 ENCOUNTER — Encounter (HOSPITAL_BASED_OUTPATIENT_CLINIC_OR_DEPARTMENT_OTHER): Payer: Self-pay | Admitting: Obstetrics and Gynecology

## 2022-06-11 NOTE — Progress Notes (Signed)
Spoke w/ via phone for pre-op interview---  pt thru [facility] interpreter ID# [id number] Lab needs dos----   t&s            Lab results------ no COVID test -----patient states asymptomatic no test needed Arrive at ------- 1145 on 06-16-2022 NPO after MN NO Solid Food.  Clear liquids from MN until--- 1045 Med rec completed Medications to take morning of surgery ----- none Diabetic medication ----- n/a Patient instructed no nail polish to be worn day of surgery Patient instructed to bring photo id and insurance card day of surgery Patient aware to have Driver (ride ) / caregiver  for 24 hours after surgery -- husband, alijandro Patient Special Instructions ----- n/a Pre-Op special Istructions ----- sent request for female spanish interpreter via email to [facility] (copy w/ chart).  Pt requested female. Patient verbalized understanding of instructions that were given at this phone interview. Patient denies shortness of breath, chest pain, fever, cough at this phone interview.

## 2022-06-16 ENCOUNTER — Encounter (HOSPITAL_BASED_OUTPATIENT_CLINIC_OR_DEPARTMENT_OTHER): Payer: Self-pay | Admitting: Obstetrics and Gynecology

## 2022-06-16 ENCOUNTER — Encounter (HOSPITAL_BASED_OUTPATIENT_CLINIC_OR_DEPARTMENT_OTHER)
Admission: RE | Disposition: A | Discharge status: Home / Self Care | Payer: Self-pay | Source: Home / Self Care | Attending: Obstetrics and Gynecology

## 2022-06-16 ENCOUNTER — Ambulatory Visit (HOSPITAL_BASED_OUTPATIENT_CLINIC_OR_DEPARTMENT_OTHER)
Admission: RE | Admit: 2022-06-16 | Discharge: 2022-06-16 | Disposition: A | Discharge status: Home / Self Care | Payer: Self-pay | Attending: Obstetrics and Gynecology | Admitting: Obstetrics and Gynecology

## 2022-06-16 ENCOUNTER — Other Ambulatory Visit: Payer: Self-pay

## 2022-06-16 ENCOUNTER — Ambulatory Visit (HOSPITAL_BASED_OUTPATIENT_CLINIC_OR_DEPARTMENT_OTHER): Payer: Self-pay | Admitting: Anesthesiology

## 2022-06-16 DIAGNOSIS — N939 Abnormal uterine and vaginal bleeding, unspecified: Secondary | ICD-10-CM

## 2022-06-16 DIAGNOSIS — N950 Postmenopausal bleeding: Secondary | ICD-10-CM

## 2022-06-16 DIAGNOSIS — Z01818 Encounter for other preprocedural examination: Secondary | ICD-10-CM

## 2022-06-16 DIAGNOSIS — Z780 Asymptomatic menopausal state: Secondary | ICD-10-CM | POA: Insufficient documentation

## 2022-06-16 HISTORY — DX: Personal history of other diseases of the female genital tract: Z87.42

## 2022-06-16 HISTORY — DX: Postmenopausal bleeding: N95.0

## 2022-06-16 HISTORY — DX: Prediabetes: R73.03

## 2022-06-16 HISTORY — DX: Unspecified osteoarthritis, unspecified site: M19.90

## 2022-06-16 HISTORY — DX: Other bursitis of hip, unspecified hip: M70.70

## 2022-06-16 HISTORY — PX: DILITATION & CURRETTAGE/HYSTROSCOPY WITH HYDROTHERMAL ABLATION: SHX5570

## 2022-06-16 HISTORY — DX: Personal history of urinary calculi: Z87.442

## 2022-06-16 HISTORY — DX: Asymptomatic varicose veins of unspecified lower extremity: I83.90

## 2022-06-16 HISTORY — DX: Lumbago with sciatica, left side: M54.42

## 2022-06-16 HISTORY — DX: Lumbago with sciatica, right side: M54.41

## 2022-06-16 HISTORY — DX: Presence of spectacles and contact lenses: Z97.3

## 2022-06-16 HISTORY — DX: Lumbago with sciatica, left side: M54.41

## 2022-06-16 LAB — ABO/RH: ABO/RH(D): B POS

## 2022-06-16 SURGERY — DILATATION & CURETTAGE/HYSTEROSCOPY WITH HYDROTHERMAL ABLATION
Anesthesia: General | Site: Uterus

## 2022-06-16 MED ORDER — IBUPROFEN 600 MG PO TABS: 600.0000 mg | ORAL_TABLET | Freq: Four times a day (QID) | ORAL | 3 refills | Status: DC | PRN

## 2022-06-16 MED ORDER — ACETAMINOPHEN 325 MG PO TABS: 325.0000 mg | ORAL_TABLET | ORAL | Status: DC | PRN

## 2022-06-16 MED ORDER — FENTANYL CITRATE (PF) 100 MCG/2ML IJ SOLN
25.0000 ug | INTRAMUSCULAR | Status: DC | PRN
  Administered 2022-06-16 (×4): 25 ug via INTRAVENOUS

## 2022-06-16 MED ORDER — FENTANYL CITRATE (PF) 100 MCG/2ML IJ SOLN
INTRAMUSCULAR | Status: DC | PRN
  Administered 2022-06-16: 25 ug via INTRAVENOUS
  Administered 2022-06-16: 50 ug via INTRAVENOUS
  Administered 2022-06-16: 25 ug via INTRAVENOUS

## 2022-06-16 MED ORDER — MIDAZOLAM HCL 2 MG/2ML IJ SOLN
INTRAMUSCULAR | Status: DC | PRN
  Administered 2022-06-16: 2 mg via INTRAVENOUS

## 2022-06-16 MED ORDER — DEXAMETHASONE SODIUM PHOSPHATE 10 MG/ML IJ SOLN
INTRAMUSCULAR | Status: DC | PRN
  Administered 2022-06-16: 5 mg via INTRAVENOUS

## 2022-06-16 MED ORDER — FENTANYL CITRATE (PF) 100 MCG/2ML IJ SOLN
INTRAMUSCULAR | Status: AC
  Filled 2022-06-16: qty 2

## 2022-06-16 MED ORDER — DEXAMETHASONE SODIUM PHOSPHATE 10 MG/ML IJ SOLN
INTRAMUSCULAR | Status: AC
  Filled 2022-06-16: qty 1

## 2022-06-16 MED ORDER — MIDAZOLAM HCL 2 MG/2ML IJ SOLN
INTRAMUSCULAR | Status: AC
  Filled 2022-06-16: qty 2

## 2022-06-16 MED ORDER — PROPOFOL 10 MG/ML IV BOLUS
INTRAVENOUS | Status: DC | PRN
  Administered 2022-06-16: 160 mg via INTRAVENOUS

## 2022-06-16 MED ORDER — PROMETHAZINE HCL 25 MG/ML IJ SOLN: 6.2500 mg | INTRAMUSCULAR | Status: DC | PRN

## 2022-06-16 MED ORDER — AMISULPRIDE (ANTIEMETIC) 5 MG/2ML IV SOLN: 10.0000 mg | Freq: Once | INTRAVENOUS | Status: DC | PRN

## 2022-06-16 MED ORDER — OXYCODONE HCL 5 MG PO TABS: 5.0000 mg | ORAL_TABLET | Freq: Once | ORAL | Status: DC | PRN

## 2022-06-16 MED ORDER — GLYCOPYRROLATE PF 0.2 MG/ML IJ SOSY
PREFILLED_SYRINGE | INTRAMUSCULAR | Status: DC | PRN
  Administered 2022-06-16: .2 mg via INTRAVENOUS

## 2022-06-16 MED ORDER — LACTATED RINGERS IV SOLN: INTRAVENOUS | Status: DC

## 2022-06-16 MED ORDER — ACETAMINOPHEN 10 MG/ML IV SOLN: 1000.0000 mg | Freq: Once | INTRAVENOUS | Status: DC | PRN

## 2022-06-16 MED ORDER — OXYCODONE HCL 5 MG/5ML PO SOLN: 5.0000 mg | Freq: Once | ORAL | Status: DC | PRN

## 2022-06-16 MED ORDER — OXYCODONE-ACETAMINOPHEN 5-325 MG PO TABS: 1.0000 | ORAL_TABLET | Freq: Four times a day (QID) | ORAL | 0 refills | Status: DC | PRN

## 2022-06-16 MED ORDER — GLYCOPYRROLATE PF 0.2 MG/ML IJ SOSY
PREFILLED_SYRINGE | INTRAMUSCULAR | Status: AC
  Filled 2022-06-16: qty 1

## 2022-06-16 MED ORDER — LIDOCAINE 2% (20 MG/ML) 5 ML SYRINGE
INTRAMUSCULAR | Status: DC | PRN
  Administered 2022-06-16: 60 mg via INTRAVENOUS

## 2022-06-16 MED ORDER — ONDANSETRON HCL 4 MG/2ML IJ SOLN
INTRAMUSCULAR | Status: DC | PRN
  Administered 2022-06-16: 4 mg via INTRAVENOUS
  Administered 2022-06-16: 5 mg via INTRAVENOUS

## 2022-06-16 MED ORDER — KETOROLAC TROMETHAMINE 30 MG/ML IJ SOLN
INTRAMUSCULAR | Status: DC | PRN
  Administered 2022-06-16: 30 mg via INTRAVENOUS

## 2022-06-16 MED ORDER — SODIUM CHLORIDE 0.9 % IR SOLN
Status: DC | PRN
  Administered 2022-06-16: 3000 mL

## 2022-06-16 MED ORDER — EPHEDRINE 5 MG/ML INJ
INTRAVENOUS | Status: AC
  Filled 2022-06-16: qty 5

## 2022-06-16 MED ORDER — CHLOROPROCAINE HCL 1 % IJ SOLN
INTRAMUSCULAR | Status: DC | PRN
  Administered 2022-06-16: 10 mL

## 2022-06-16 MED ORDER — ACETAMINOPHEN 160 MG/5ML PO SOLN: 325.0000 mg | ORAL | Status: DC | PRN

## 2022-06-16 MED ORDER — ONDANSETRON HCL 4 MG/2ML IJ SOLN
INTRAMUSCULAR | Status: AC
  Filled 2022-06-16: qty 2

## 2022-06-16 MED ORDER — KETOROLAC TROMETHAMINE 30 MG/ML IJ SOLN
INTRAMUSCULAR | Status: AC
  Filled 2022-06-16: qty 1

## 2022-06-16 MED ORDER — EPHEDRINE SULFATE-NACL 50-0.9 MG/10ML-% IV SOSY
PREFILLED_SYRINGE | INTRAVENOUS | Status: DC | PRN
  Administered 2022-06-16: 15 mg via INTRAVENOUS

## 2022-06-16 SURGICAL SUPPLY — 14 items
BRIEF MESH DISP 2XL (UNDERPADS AND DIAPERS) ×1 IMPLANT
CATH ROBINSON RED A/P 16FR (CATHETERS) ×1 IMPLANT
DRSG TELFA 3X8 NADH STRL (GAUZE/BANDAGES/DRESSINGS) ×1 IMPLANT
GAUZE 4X4 16PLY ~~LOC~~+RFID DBL (SPONGE) ×1 IMPLANT
GLOVE BIOGEL PI IND STRL 6.5 (GLOVE) ×1 IMPLANT
GLOVE BIOGEL PI IND STRL 7.0 (GLOVE) ×1 IMPLANT
GLOVE SURG SS PI 6.5 STRL IVOR (GLOVE) ×1 IMPLANT
GOWN STRL REUS W/TWL LRG LVL3 (GOWN DISPOSABLE) ×2 IMPLANT
KIT TURNOVER CYSTO (KITS) ×1 IMPLANT
PACK VAGINAL MINOR WOMEN LF (CUSTOM PROCEDURE TRAY) ×1 IMPLANT
PAD OB MATERNITY 4.3X12.25 (PERSONAL CARE ITEMS) ×1 IMPLANT
PAD PREP 24X48 CUFFED NSTRL (MISCELLANEOUS) ×1 IMPLANT
SEAL ROD LENS SCOPE MYOSURE (ABLATOR) ×1 IMPLANT
SET GENESYS HTA PROCERVA (MISCELLANEOUS) ×1 IMPLANT

## 2022-06-16 NOTE — Transfer of Care (Signed)
Immediate Anesthesia Transfer of Care Note  Patient: Corrisa Gibby  Procedure(s) Performed: Procedure(s) (LRB): DILATATION & CURETTAGE/HYSTEROSCOPY WITH HYDROTHERMAL ABLATION (N/A)  Patient Location: PACU  Anesthesia Type: General  Level of Consciousness: awake, oriented, sedated and patient cooperative  Airway & Oxygen Therapy: Patient Spontanous Breathing and Patient connected to face mask oxygen  Post-op Assessment: Report given to PACU RN and Post -op Vital signs reviewed and stable  Post vital signs: Reviewed and stable  Complications: No apparent anesthesia complications Last Vitals:  Vitals Value Taken Time  BP 123/63 06/16/22 1455  Temp    Pulse 58 06/16/22 1456  Resp 15 06/16/22 1456  SpO2 100 % 06/16/22 1456  Vitals shown include unvalidated device data.  Last Pain:  Vitals:   06/16/22 1210  TempSrc: Oral  PainSc: 0-No pain      Patients Stated Pain Goal: 5 (56/70/14 1030)  Complications: No notable events documented.

## 2022-06-16 NOTE — Anesthesia Procedure Notes (Signed)
Procedure Name: LMA Insertion Date/Time: 06/16/2022 2:14 PM  Performed by: Suan Halter, CRNAPre-anesthesia Checklist: Patient identified, Emergency Drugs available, Suction available and Patient being monitored Patient Re-evaluated:Patient Re-evaluated prior to induction Oxygen Delivery Method: Circle system utilized Preoxygenation: Pre-oxygenation with 100% oxygen Induction Type: IV induction Ventilation: Mask ventilation without difficulty LMA: LMA inserted LMA Size: 4.0 Number of attempts: 1 Airway Equipment and Method: Bite block Placement Confirmation: positive ETCO2 Tube secured with: Tape Dental Injury: Teeth and Oropharynx as per pre-operative assessment

## 2022-06-16 NOTE — Discharge Instructions (Addendum)
No ibuprofen, Advil, Aleve, Motrin, ketorolac, meloxicam, naproxen, or other NSAIDS until after 8:45pm today if needed for pain.    DISCHARGE INSTRUCTIONS: HYSTEROSCOPY / ENDOMETRIAL ABLATION The following instructions have been prepared to help you care for yourself upon your return home.   Personal hygiene:  Use sanitary pads for vaginal drainage, not tampons.  Shower the day after your procedure.  NO tub baths, pools or Jacuzzis for 2-3 weeks.  Wipe front to back after using the bathroom.  Activity and limitations:  Do NOT drive or operate any equipment for 24 hours. The effects of anesthesia are still present and drowsiness may result.  Do NOT rest in bed all day.  Walking is encouraged.  Walk up and down stairs slowly.  You may resume your normal activity in one to two days or as indicated by your physician. Sexual activity: NO intercourse for at least 2 weeks after the procedure, or as indicated by your Doctor.  Diet: Eat a light meal as desired this evening. You may resume your usual diet tomorrow.  Return to Work: You may resume your work activities in one to two days or as indicated by Marine scientist.  What to expect after your surgery: Expect to have vaginal bleeding/discharge for 2-3 days and spotting for up to 10 days. It is not unusual to have soreness for up to 1-2 weeks. You may have a slight burning sensation when you urinate for the first day. Mild cramps may continue for a couple of days. You may have a regular period in 2-6 weeks.  Call your doctor for any of the following:  Excessive vaginal bleeding or clotting, saturating and changing one pad every hour.  Inability to urinate 6 hours after discharge from hospital.  Pain not relieved by pain medication.  Fever of 100.4 F or greater.  Unusual vaginal discharge or odor.    Post Anesthesia Home Care Instructions  Activity: Get plenty of rest for the remainder of the day. A responsible individual must stay  with you for 24 hours following the procedure.  For the next 24 hours, DO NOT: -Drive a car -Paediatric nurse -Drink alcoholic beverages -Take any medication unless instructed by your physician -Make any legal decisions or sign important papers.  Meals: Start with liquid foods such as gelatin or soup. Progress to regular foods as tolerated. Avoid greasy, spicy, heavy foods. If nausea and/or vomiting occur, drink only clear liquids until the nausea and/or vomiting subsides. Call your physician if vomiting continues.  Special Instructions/Symptoms: Your throat may feel dry or sore from the anesthesia or the breathing tube placed in your throat during surgery. If this causes discomfort, gargle with warm salt water. The discomfort should disappear within 24 hours.

## 2022-06-16 NOTE — Op Note (Signed)
PREOPERATIVE DIAGNOSIS:  Abnormal uterine bleeding POSTOPERATIVE DIAGNOSIS: The same PROCEDURE: Hysteroscopy, Dilation and Curettage,  Hydrothermal Endometrial Ablation SURGEON:  Dr. Mora Bellman  INDICATIONS: 56 y.o. [id number] here for scheduled surgery for postmenopausal uterine bleeding. Risks of surgery were discussed with the patient including but not limited to: bleeding; infection which may require antibiotics; injury to uterus leading to risk of injury to surrounding intraperitoneal organs, burn injury to vagina or other organs, need for additional procedures including laparoscopy or laparotomy, inability to complete ablation due to uterine or mechanical anomaly, and other postoperative/anesthesia complications.  Patient was informed that there is a high likelihood of success of controlling her symptoms; however about 5% of patients may require further intervention.  Written informed consent was obtained.    FINDINGS:  A 10 week size uterus.  Diffuse proliferative endometrium.  Normal ostia bilaterally.  ANESTHESIA:   General, paracervical block. INTRAVENOUS FLUIDS:  700 ml of LR ESTIMATED BLOOD LOSS:  20 ml SPECIMENS: Endometrial curettings sent to pathology COMPLICATIONS:  None immediate.  PROCEDURE DETAILS:  The patient was then taken to the operating room where general anesthesia was administered and was found to be adequate.  After an adequate timeout was performed, she was placed in the dorsal lithotomy position and examined; then prepped and draped in the sterile manner.   Her bladder was catheterized for clear, yellow urine. A speculum was then placed in the patient's vagina and a single tooth tenaculum was applied to the anterior lip of the cervix.   A paracervical block using 10 ml of 0.5% Marcaine was administered.  The cervix was sounded to 10 cm and dilated manually with metal dilators to accommodate the hydrothermal ablation hysteroscopic apparatus.  Once the cervix was dilated,  a sharp curettage was then performed to obtain a moderate amount of endometrial curettings.  The hysteroscope was inserted under direct visualization using normal saline as a suspension medium.  The uterine cavity was carefully examined, both ostia were recognized, and diffusely proliferative endometrium was noted.   The hydrothermal ablation was then carried out as per protocol.   Complete ablation of the endometrium was observed and the hysteroscope was removed under direct visualization.  No complications were observed.  The tenaculum was removed from the anterior lip of the cervix, and the vaginal speculum was removed after noting good hemostasis.  The patient tolerated the procedure well and was taken to the recovery area awake, extubated and in stable condition.  The patient will be discharged to home as per PACU criteria.  Routine postoperative instructions given.

## 2022-06-16 NOTE — H&P (Signed)
Taylor Parker is an 56 y.o. female P3 post menopausal with postmenopausal vaginal bleeding here for scheduled D&C hysteroscopy and endometrial ablation. Patient had a negative endometrial biopsy in the office suggestive of the presence of a uterine polyp. She failed medical management with megace. Patient reports resolution of vaginal bleeding on 06/07/22. She states that previously the bleeding was heavy and taking place daily. She denies pelvic pain or abnormal discharge    Menstrual History: Patient's last menstrual period was 08/31/2020 (exact date).    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;   hx ablation left GSV 2013 in [place];   left small SV laser ablation [facility]  06-09-2017   Wears glasses     Past Surgical History:  Procedure Laterality Date   BLADDER SURGERY  12/09/2016   per pt sling   BREAST BIOPSY  06/19/2011   Procedure: BREAST BIOPSY;  Surgeon: Rolm Bookbinder, MD;  Location: [facility];  Service: General;  Laterality: Right;  Right breast chronic abscess drainage and debridement   BREAST DUCTAL SYSTEM EXCISION Left 01/24/2014   Procedure:  LEFT BREAST DUCT EXCISION;  Surgeon: Rolm Bookbinder, MD;  Location: [facility];  Service: General;  Laterality: Left;   CESAREAN SECTION Bilateral 03/15/2003   x3  first two in [place];  last one [facility]  ;   WITH BILATERAL TUBAL LIGATION   COLONOSCOPY  08/15/2021   dr Eber Jones    DILATATION & CURETTAGE/HYSTEROSCOPY WITH MYOSURE N/A 05/10/2014   Procedure: DILATATION & CURETTAGE/HYSTEROSCOPY WITH MYOSURE ABLATION,RESECTOSCOPIC POLYPECTOMY;  Surgeon: Terrance Mass, MD;  Location: [facility];  Service: Gynecology;  Laterality: N/A;  Confirmed with [employer] rep to be present.   INCISE AND DRAIN ABCESS Right 03/05/2011   '[facility]   by dr Donne Hazel;   I&D right breast abscess and incisional bx   IR EMBO VENOUS NOT HEMORR HEMANG  INC GUIDE ROADMAPPING  06/09/2017   IR RADIOLOGIST EVAL & MGMT  05/04/2017   IR RADIOLOGIST EVAL & MGMT  06/29/2017   VARICOSE VEIN SURGERY Left 2013   in [place];  left leg    Family History  Problem Relation Age of Onset   Heart disease Mother    Diabetes Other        gm   High Cholesterol Brother        high TG   Colon cancer Neg Hx    Breast cancer Neg Hx    Esophageal cancer Neg Hx    Stomach cancer Neg Hx    Rectal cancer Neg Hx     Social History:  reports that she has never smoked. She has never used smokeless tobacco. She reports current alcohol use. She reports that she does not use drugs.  Allergies: No Known Allergies  Medications Prior to Admission  Medication Sig Dispense Refill Last  Dose   methocarbamol (ROBAXIN) 500 MG tablet Take 500 mg by mouth 2 (two) times daily. PER PT COMPLETED PRESCRIPTION   Past Month   atorvastatin (LIPITOR) 40 MG tablet Take 20 mg by mouth daily. (Patient not taking: Reported on 05/06/2022)   More than a month   medroxyPROGESTERone (PROVERA) 10 MG tablet Take 1 tablet (10 mg total) by mouth daily. (Patient not taking: Reported on 05/18/2022) 30 tablet 0    megestrol (MEGACE) 40 MG tablet Take 1 tablet (40 mg total) by mouth 2 (two) times daily. Can increase to two tablets twice a day in the event of heavy bleeding (Patient not taking: Reported on 05/06/2022) 60 tablet 5    meloxicam (MOBIC) 7.5 MG tablet Take 7.5 mg by mouth 2 (two) times daily.   More than a month    Review of Systems See pertinent  in HPI. All other systems reviewed and non contributory Blood pressure (!) 104/58, pulse (!) 57, temperature 97.7 F (36.5 C), temperature source Oral, resp. rate 17, height '5\' 2"'$  (1.575 m), weight 79 kg, last menstrual period 08/31/2020, SpO2 99 %. Physical Exam GENERAL: Well-developed, well-nourished female in no acute distress.  LUNGS: Clear to auscultation bilaterally.  HEART: Regular rate and rhythm. ABDOMEN: Soft, nontender, nondistended. No organomegaly. PELVIC: Deferred to OR EXTREMITIES: No cyanosis, clubbing, or edema, 2+ distal pulses.  No results found for this or any previous visit (from the past 24 hour(s)).  No results found. US PELVIC COMPLETE WITH TRANSVAGINAL   Result Date: 05/06/2022 CLINICAL DATA:  Vaginal bleeding with left lower quadrant pain. EXAM: TRANSABDOMINAL AND TRANSVAGINAL ULTRASOUND OF PELVIS TECHNIQUE: Both transabdominal and transvaginal ultrasound examinations of the pelvis were performed. Transabdominal technique was performed for global imaging of the pelvis including uterus, ovaries, adnexal regions, and pelvic cul-de-sac. It was necessary to proceed with endovaginal exam following the transabdominal exam to visualize the endometrium. COMPARISON:  None Available. FINDINGS: Uterus Measurements: 10.3 x 5.9 x 6.6 cm = volume: 208 mL. Myometrium is diffusely heterogeneous suggesting adenomyosis. Focal 3.7 x 4.2 x 4.4 cm right fundal lesion with vascularity suggestive fibroid, generating mass-effect on the fundal endometrium. Endometrium Thickness: 19 mm. Endometrium is heterogeneous and thickened up to 19 mm. Right ovary Measurements: Not visualized. Left ovary Measurements: Not visualized. Other findings No abnormal free fluid. IMPRESSION: 1. Thickened heterogeneous endometrium measuring up to 19 mm. In the setting of post-menopausal bleeding, endometrial sampling is indicated to exclude carcinoma. If results are benign, sonohysterogram should be considered for  focal lesion work-up. (Ref: Radiological Reasoning: Algorithmic Workup of Abnormal Vaginal Bleeding with Endovaginal Sonography and Sonohysterography. AJR 2008; 182:X93-71) 2. Diffusely heterogeneous myometrium suggesting adenomyosis. 3. 4.4 cm right fundal mass with vascularity suggestive of a fibroid. Focal adenomyomatosis also be a consideration. 4. Nonvisualization of the ovaries. Electronically Signed   By: Misty Stanley M.D.   On: 05/06/2022 18:00      04/2022 endometrial biopsy FINAL MICROSCOPIC DIAGNOSIS:   A. ENDOMETRIUM, BIOPSY:  - Fragments of benign endometrial polyp  - Negative for hyperplasia or malignancy   Assessment/Plan: 56 yo with postmenopausal vaginal bleeding here for scheduled D&C with endometrial ablation - Risks, benefits and alternatives were explained including but not limited to risks of bleeding, infection, uterine perforation and damage to adjacent organs. Patient verbalized understanding and all questions were answered  Chon Buhl 06/16/2022, 12:58 PM

## 2022-06-16 NOTE — Anesthesia Preprocedure Evaluation (Addendum)
Anesthesia Evaluation  Patient identified by MRN, date of birth, ID band Patient awake    Reviewed: Allergy & Precautions, NPO status , Patient's Chart, lab work & pertinent test results  Airway Mallampati: I  TM Distance: >3 FB Neck ROM: Full    Dental  (+) Teeth Intact, Dental Advisory Given   Pulmonary    breath sounds clear to auscultation       Cardiovascular negative cardio ROS  Rhythm:Regular Rate:Normal     Neuro/Psych  Headaches PSYCHIATRIC DISORDERS Anxiety Depression     Neuromuscular disease    GI/Hepatic negative GI ROS, Neg liver ROS,,,  Endo/Other  negative endocrine ROS    Renal/GU negative Renal ROS     Musculoskeletal  (+) Arthritis ,    Abdominal Normal abdominal exam  (+)   Peds  Hematology negative hematology ROS (+)   Anesthesia Other Findings   Reproductive/Obstetrics                             Anesthesia Physical Anesthesia Plan  ASA: 2  Anesthesia Plan: General   Post-op Pain Management: Tylenol PO (pre-op)* and Celebrex PO (pre-op)*   Induction: Intravenous  PONV Risk Score and Plan: 4 or greater and Ondansetron, Dexamethasone, Midazolam and Scopolamine patch - Pre-op  Airway Management Planned: LMA  Additional Equipment: None  Intra-op Plan:   Post-operative Plan: Extubation in OR  Informed Consent: I have reviewed the patients History and Physical, chart, labs and discussed the procedure including the risks, benefits and alternatives for the proposed anesthesia with the patient or authorized representative who has indicated his/her understanding and acceptance.     Dental advisory given  Plan Discussed with: CRNA  Anesthesia Plan Comments:        Anesthesia Quick Evaluation

## 2022-06-17 ENCOUNTER — Encounter (HOSPITAL_BASED_OUTPATIENT_CLINIC_OR_DEPARTMENT_OTHER): Payer: Self-pay | Admitting: Obstetrics and Gynecology

## 2022-06-17 ENCOUNTER — Ambulatory Visit: Payer: No Typology Code available for payment source | Admitting: Medical

## 2022-06-17 LAB — TYPE AND SCREEN
ABO/RH(D): B POS
Antibody Screen: NEGATIVE

## 2022-06-17 NOTE — Anesthesia Postprocedure Evaluation (Signed)
Anesthesia Post Note  Patient: Kamiya Acord  Procedure(s) Performed: DILATATION & CURETTAGE/HYSTEROSCOPY WITH HYDROTHERMAL ABLATION (Uterus)     Patient location during evaluation: PACU Anesthesia Type: General Level of consciousness: awake and alert Pain management: pain level controlled Vital Signs Assessment: post-procedure vital signs reviewed and stable Respiratory status: spontaneous breathing, nonlabored ventilation, respiratory function stable and patient connected to nasal cannula oxygen Cardiovascular status: blood pressure returned to baseline and stable Postop Assessment: no apparent nausea or vomiting Anesthetic complications: no   No notable events documented.  Last Vitals:  Vitals:   06/16/22 1600 06/16/22 1620  BP: 122/74 131/69  Pulse: 68 (!) 54  Resp: 16 16  Temp: (!) 36.3 C (!) 36.3 C  SpO2: 96% 99%    Last Pain:  Vitals:   06/16/22 1620  TempSrc:   PainSc: [place]

## 2022-06-18 LAB — SURGICAL PATHOLOGY

## 2022-06-19 ENCOUNTER — Ambulatory Visit (INDEPENDENT_AMBULATORY_CARE_PROVIDER_SITE_OTHER): Payer: Self-pay | Admitting: Medical

## 2022-06-19 VITALS — BP 111/50 | HR 82 | Temp 98.0°F | Resp 18 | Ht 62.0 in | Wt 174.6 lb

## 2022-06-19 DIAGNOSIS — R5383 Other fatigue: Secondary | ICD-10-CM

## 2022-06-19 DIAGNOSIS — R739 Hyperglycemia, unspecified: Secondary | ICD-10-CM

## 2022-06-19 DIAGNOSIS — R102 Pelvic and perineal pain: Secondary | ICD-10-CM

## 2022-06-19 LAB — COMPREHENSIVE METABOLIC PANEL
ALT: 12 U/L (ref 0–35)
AST: 15 U/L (ref 0–37)
Albumin: 4.1 g/dL (ref 3.5–5.2)
Alkaline Phosphatase: 67 U/L (ref 39–117)
BUN: 17 mg/dL (ref 6–23)
CO2: 34 mEq/L — ABNORMAL HIGH (ref 19–32)
Calcium: 9.3 mg/dL (ref 8.4–10.5)
Chloride: 102 mEq/L (ref 96–112)
Creatinine, Ser: 0.76 mg/dL (ref 0.40–1.20)
GFR: 87.55 mL/min (ref >60.00)
Glucose, Bld: 83 mg/dL (ref 70–99)
Potassium: 4.6 mEq/L (ref 3.5–5.1)
Sodium: 140 mEq/L (ref 135–145)
Total Bilirubin: 0.3 mg/dL (ref 0.2–1.2)
Total Protein: 6.7 g/dL (ref 6.0–8.3)

## 2022-06-19 LAB — CBC WITH DIFFERENTIAL/PLATELET
Basophils Absolute: 0.1 10*3/uL (ref 0.0–0.1)
Basophils Relative: 0.8 % (ref 0.0–3.0)
Eosinophils Absolute: 0.1 10*3/uL (ref 0.0–0.7)
Eosinophils Relative: 0.6 % (ref 0.0–5.0)
HCT: 40.4 % (ref 36.0–46.0)
Hemoglobin: 13.7 g/dL (ref 12.0–15.0)
Lymphocytes Relative: 26.6 % (ref 12.0–46.0)
Lymphs Abs: 2.1 10*3/uL (ref 0.7–4.0)
MCHC: 33.8 g/dL (ref 30.0–36.0)
MCV: 91.9 fl (ref 78.0–100.0)
Monocytes Absolute: 0.5 10*3/uL (ref 0.1–1.0)
Monocytes Relative: 7 % (ref 3.0–12.0)
Neutro Abs: 5 10*3/uL (ref 1.4–7.7)
Neutrophils Relative %: 65 % (ref 43.0–77.0)
Platelets: 266 10*3/uL (ref 150.0–400.0)
RBC: 4.4 Mil/uL (ref 3.87–5.11)
RDW: 13 % (ref 11.5–15.5)
WBC: 7.7 10*3/uL (ref 4.0–10.5)

## 2022-06-19 LAB — VITAMIN B12: Vitamin B-12: 869 pg/mL (ref 211–911)

## 2022-06-19 LAB — IRON: Iron: 76 ug/dL (ref 42–145)

## 2022-06-19 LAB — T4, FREE: Free T4: 1.06 ng/dL (ref 0.60–1.60)

## 2022-06-19 LAB — HEMOGLOBIN A1C: Hgb A1c MFr Bld: 5.7 % (ref 4.6–6.5)

## 2022-06-19 LAB — TSH: TSH: 0.68 u[IU]/mL (ref 0.35–5.50)

## 2022-06-19 NOTE — Patient Instructions (Addendum)
For fatigue - will order cbc, cmp, tsh, t4, b12, iron and b1 level.  For elevated sugar get A1c.  Recommend trying to getting 8 hours sleep at night as you not only 6 hours at night. In addition regular exercise and eat healthy.   Considered urine studies today but lack of symptoms and no pain over bladder on palpation.  Has been some time since I saw you please keep follow up appointment.  Follow up 3-4 weeks or sooner if needed

## 2022-06-19 NOTE — Progress Notes (Signed)
Subjective:    Patient ID: Taylor Parker, female    DOB: April 24, 1966, 56 y.o.   MRN: [id number]  HPI  Pt in for evaluation. I saw her once in past and she did not recall. States did not return as she has no insurance.   She states recent gyn visit for endometrial curretage. She states no longer heavy menses. No fever.   Pt states for 2 days afetr the above she felt tired. She feels better now but still mild fatigued.  Mild elevated sugar in the past.      Review of Systems  Constitutional:  Negative for chills and fatigue.  HENT:  Negative for dental problem.   Respiratory:  Negative for cough, chest tightness, shortness of breath and wheezing.   Cardiovascular:  Negative for chest pain and palpitations.  Genitourinary:  Negative for dysuria, flank pain, hematuria and urgency.  Musculoskeletal:  Negative for back pain and gait problem.  Skin:  Negative for rash.  Neurological:  Negative for dizziness, syncope and numbness.  Hematological:  Negative for adenopathy.    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;   hx ablation left GSV 2013 in [place];   left small SV laser ablation '@[facility]'$  06-09-2017   Wears glasses      Social History   Socioeconomic History   Marital status: Married    Spouse name: Not on file   Number of children: 3   Years of education: Not on file   Highest education level: 9th grade  Occupational  History   Occupation: [employer]  7pm-4 am  Tobacco Use   Smoking status: Never   Smokeless tobacco: Never  Vaping Use   Vaping Use: Never used  Substance and Sexual Activity   Alcohol use: Yes    Comment: occ   Drug use: Never   Sexual activity: Yes    Partners: Male    Birth control/protection: Surgical    Comment: 1st intercourse- 18, partners- 2, married- 56 yrs   Other Topics Concern   Not on file  Social History Narrative   Original from [place]   [place]: pt, husband, son and G-son       Social Determinants of Health   Financial Resource Strain: Not on file  Food Insecurity: No Food Insecurity (05/18/2022)   Hunger Vital Sign    Worried About Running Out of Food in the Last Year: Never true    Ran Out of Food in the Last Year: Never true  Transportation Needs: No Transportation Needs (05/18/2022)   PRAPARE - Hydrologist (Medical): No    Lack of Transportation (Non-Medical): No  Physical Activity: Not on file  Stress: Not on file  Social Connections: Not on file  Intimate Partner Violence: Not on file  Past Surgical History:  Procedure Laterality Date   BLADDER SURGERY  12/09/2016   per pt sling   BREAST BIOPSY  06/19/2011   Procedure: BREAST BIOPSY;  Surgeon: Rolm Bookbinder, MD;  Location: [facility];  Service: General;  Laterality: Right;  Right breast chronic abscess drainage and debridement   BREAST DUCTAL SYSTEM EXCISION Left 01/24/2014   Procedure:  LEFT BREAST DUCT EXCISION;  Surgeon: Rolm Bookbinder, MD;  Location: [facility];  Service: General;  Laterality: Left;   CESAREAN SECTION Bilateral 03/15/2003   x3  first two in [place];  last one [facility]  ;   WITH BILATERAL TUBAL LIGATION   COLONOSCOPY  08/15/2021   dr Eber Jones   DILATATION & CURETTAGE/HYSTEROSCOPY WITH MYOSURE N/A 05/10/2014   Procedure: DILATATION & CURETTAGE/HYSTEROSCOPY WITH MYOSURE ABLATION,RESECTOSCOPIC POLYPECTOMY;  Surgeon: Terrance Mass, MD;   Location: [facility];  Service: Gynecology;  Laterality: N/A;  Confirmed with My[employer] rep to be present.   DILITATION & CURRETTAGE/HYSTROSCOPY WITH HYDROTHERMAL ABLATION N/A 06/16/2022   Procedure: DILATATION & CURETTAGE/HYSTEROSCOPY WITH HYDROTHERMAL ABLATION;  Surgeon: Mora Bellman, MD;  Location: [facility];  Service: Gynecology;  Laterality: N/A;   INCISE AND DRAIN ABCESS Right 03/05/2011   '@[facility]'$   by dr Donne Hazel;   I&D right breast abscess and incisional bx   IR EMBO VENOUS NOT HEMORR HEMANG  INC GUIDE ROADMAPPING  06/09/2017   IR RADIOLOGIST EVAL & MGMT  05/04/2017   IR RADIOLOGIST EVAL & MGMT  06/29/2017   VARICOSE VEIN SURGERY Left 2013   in [place];  left leg    Family History  Problem Relation Age of Onset   Heart disease Mother    Diabetes Other        gm   High Cholesterol Brother        high TG   Colon cancer Neg Hx    Breast cancer Neg Hx    Esophageal cancer Neg Hx    Stomach cancer Neg Hx    Rectal cancer Neg Hx     No Known Allergies  Current Outpatient Medications on File Prior to Visit  Medication Sig Dispense Refill   ibuprofen (ADVIL) 600 MG tablet Take 1 tablet (600 mg total) by mouth every 6 (six) hours as needed. 60 tablet 3   oxyCODONE-acetaminophen (PERCOCET/ROXICET) 5-325 MG tablet Take 1 tablet by mouth every 6 (six) hours as needed. 10 tablet 0   No current facility-administered medications on file prior to visit.    BP (!) 111/50   Pulse 82   Temp 98 F (36.7 C)   Resp 18   Ht '5\' 2"'$  (1.575 m)   Wt 174 lb 9.6 oz (79.2 kg)   LMP 08/31/2020 (Exact Date)   SpO2 98%   BMI 31.93 kg/m        Objective:   Physical Exam  General Mental Status- Alert. General Appearance- Not in acute distress.   Skin General: Color- Normal Color. Moisture- Normal Moisture.  Neck Carotid Arteries- Normal color. Moisture- Normal Moisture. No carotid bruits. No JVD.  Chest and Lung Exam Auscultation: Breath  Sounds:-Normal.  Cardiovascular Auscultation:Rythm- Regular. Murmurs & Other Heart Sounds:Auscultation of the heart reveals- No Murmurs.  Abdomen Inspection:-Inspeection Normal. Palpation/Percussion:Note:No mass. Palpation and Percussion of the abdomen reveal- Non Tender, Non Distended + BS, no rebound or guarding. No tenderness at all on palpation over bladder.  Back- no cva tenderness.    Neurologic Cranial Nerve exam:- CN III-XII intact(No nystagmus), symmetric smile. Strength:- 5/5 equal and symmetric  strength both upper and lower extremities.       Assessment & Plan:

## 2022-06-24 LAB — VITAMIN B1: Vitamin B1 (Thiamine): 16 nmol/L (ref 8–30)

## 2022-07-08 ENCOUNTER — Ambulatory Visit: Payer: Self-pay | Admitting: Nurse Practitioner

## 2022-07-20 ENCOUNTER — Ambulatory Visit (INDEPENDENT_AMBULATORY_CARE_PROVIDER_SITE_OTHER): Payer: Self-pay | Admitting: Obstetrics and Gynecology

## 2022-07-20 ENCOUNTER — Encounter: Payer: Self-pay | Admitting: Obstetrics and Gynecology

## 2022-07-20 VITALS — BP 106/70 | HR 85 | Wt 180.0 lb

## 2022-07-20 DIAGNOSIS — Z9889 Other specified postprocedural states: Secondary | ICD-10-CM

## 2022-07-20 NOTE — Progress Notes (Signed)
57 yo here for postop Parker following D&C with endometrial ablation on 06/16/22 for AUB. Patient reports feeling well and is without any complaints. She reports no further episodes of vaginal bleeding. Vaginal discharge stopped. She is without any complaints  Past Medical History:  Diagnosis Date   Anxiety    Bilateral low back pain with bilateral sciatica    Bursitis of hip    Headache(784.0)    otc med prn   History of abnormal cervical Pap smear    History of DVT of lower extremity 10/2002   4 months preg. 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;   hx ablation left GSV 2013 in [place];   left small SV laser ablation [facility]  06-09-2017   Wears glasses    Past Surgical History:  Procedure Laterality Date   BLADDER SURGERY  12/09/2016   per pt sling   BREAST BIOPSY  06/19/2011   Procedure: BREAST BIOPSY;  Surgeon: Rolm Bookbinder, MD;  Location: [facility];  Service: General;  Laterality: Right;  Right breast chronic abscess drainage and debridement   BREAST DUCTAL SYSTEM EXCISION Left 01/24/2014   Procedure:  LEFT BREAST DUCT EXCISION;  Surgeon: Rolm Bookbinder, MD;  Location: [facility];  Service: General;  Laterality: Left;   CESAREAN SECTION Bilateral 03/15/2003   x3  first two in [place];  last one [facility]  ;   WITH BILATERAL TUBAL LIGATION   COLONOSCOPY  08/15/2021   dr Eber Jones   DILATATION & CURETTAGE/HYSTEROSCOPY WITH MYOSURE N/A 05/10/2014   Procedure: DILATATION & CURETTAGE/HYSTEROSCOPY WITH MYOSURE ABLATION,RESECTOSCOPIC POLYPECTOMY;  Surgeon: Terrance Mass, MD;  Location: [facility];  Service: Gynecology;  Laterality: N/A;  Confirmed with My[employer] rep to be present.   DILITATION &  CURRETTAGE/HYSTROSCOPY WITH HYDROTHERMAL ABLATION N/A 06/16/2022   Procedure: DILATATION & CURETTAGE/HYSTEROSCOPY WITH HYDROTHERMAL ABLATION;  Surgeon: Mora Bellman, MD;  Location: [facility];  Service: Gynecology;  Laterality: N/A;   INCISE AND DRAIN ABCESS Right 03/05/2011   '[facility]'$   by dr Donne Hazel;   I&D right breast abscess and incisional bx   IR EMBO VENOUS NOT HEMORR HEMANG  INC GUIDE ROADMAPPING  06/09/2017   IR RADIOLOGIST EVAL & MGMT  05/04/2017   IR RADIOLOGIST EVAL & MGMT  06/29/2017   VARICOSE VEIN SURGERY Left 2013   in [place];  left leg   Family History  Problem Relation Age of Onset   Heart disease Mother    Diabetes Other        gm   High Cholesterol Brother        high TG   Colon cancer Neg Hx    Breast cancer Neg Hx    Esophageal cancer Neg Hx    Stomach cancer Neg Hx    Rectal cancer Neg Hx    Social History   Tobacco Use   Smoking status: Never   Smokeless tobacco: Never  Vaping Use   Vaping Use: Never used  Substance Use Topics   Alcohol use: Yes    Comment: occ   Drug use: Never   ROS See pertinent in HPI. All other systems reviewed and non contributory Blood  pressure 106/70, pulse 85, weight 180 lb (Taylor.6 kg), last menstrual period 08/31/2020. GENERAL: Well-developed, well-nourished female in no acute distress.  NEURO: alert and oriented x 3  A/P 57 yo here for post op Parker - Pathology results reviewed with the patient which were benign - Patient has resumed all activities of daily living - Patient to return with any future complaints - Patient due for repeat pap smear in the fall 2024

## 2022-07-20 NOTE — Progress Notes (Signed)
Pt states she is doing well, no concerns today.

## 2022-07-21 ENCOUNTER — Other Ambulatory Visit (HOSPITAL_BASED_OUTPATIENT_CLINIC_OR_DEPARTMENT_OTHER): Payer: Self-pay

## 2022-07-21 ENCOUNTER — Ambulatory Visit (INDEPENDENT_AMBULATORY_CARE_PROVIDER_SITE_OTHER): Payer: Self-pay | Admitting: Medical

## 2022-07-21 VITALS — BP 104/56 | HR 62 | Resp 18 | Ht 62.0 in | Wt 180.0 lb

## 2022-07-21 DIAGNOSIS — S46811A Strain of other muscles, fascia and tendons at shoulder and upper arm level, right arm, initial encounter: Secondary | ICD-10-CM

## 2022-07-21 DIAGNOSIS — M546 Pain in thoracic spine: Secondary | ICD-10-CM

## 2022-07-21 DIAGNOSIS — E782 Mixed hyperlipidemia: Secondary | ICD-10-CM

## 2022-07-21 DIAGNOSIS — M26609 Unspecified temporomandibular joint disorder, unspecified side: Secondary | ICD-10-CM

## 2022-07-21 DIAGNOSIS — S46819A Strain of other muscles, fascia and tendons at shoulder and upper arm level, unspecified arm, initial encounter: Secondary | ICD-10-CM

## 2022-07-21 DIAGNOSIS — R739 Hyperglycemia, unspecified: Secondary | ICD-10-CM

## 2022-07-21 LAB — LIPID PANEL
Cholesterol: 237 mg/dL — ABNORMAL HIGH (ref 0–200)
HDL: 42.2 mg/dL (ref >39.00)
NonHDL: 194.43
Total CHOL/HDL Ratio: 6
Triglycerides: 348 mg/dL — ABNORMAL HIGH (ref 0.0–149.0)
VLDL: 69.6 mg/dL — ABNORMAL HIGH (ref 0.0–40.0)

## 2022-07-21 LAB — COMPREHENSIVE METABOLIC PANEL
ALT: 13 U/L (ref 0–35)
AST: 14 U/L (ref 0–37)
Albumin: 3.9 g/dL (ref 3.5–5.2)
Alkaline Phosphatase: 75 U/L (ref 39–117)
BUN: 10 mg/dL (ref 6–23)
CO2: 27 mEq/L (ref 19–32)
Calcium: 8.9 mg/dL (ref 8.4–10.5)
Chloride: 106 mEq/L (ref 96–112)
Creatinine, Ser: 0.69 mg/dL (ref 0.40–1.20)
GFR: 96.91 mL/min (ref >60.00)
Glucose, Bld: 92 mg/dL (ref 70–99)
Potassium: 4.6 mEq/L (ref 3.5–5.1)
Sodium: 140 mEq/L (ref 135–145)
Total Bilirubin: 0.3 mg/dL (ref 0.2–1.2)
Total Protein: 6.7 g/dL (ref 6.0–8.3)

## 2022-07-21 LAB — LDL CHOLESTEROL, DIRECT: Direct LDL: 123 mg/dL

## 2022-07-21 MED ORDER — CYCLOBENZAPRINE HCL 5 MG PO TABS
5.0000 mg | ORAL_TABLET | Freq: Every evening | ORAL | 0 refills | Status: DC | PRN
  Filled 2022-07-21: qty 5, 5d supply, fill #0

## 2022-07-21 MED ORDER — CYCLOBENZAPRINE HCL 5 MG PO TABS: ORAL_TABLET | ORAL | 0 refills | Status: DC

## 2022-07-21 NOTE — Patient Instructions (Addendum)
Fatigue resolved and labs were normal.  Sugar average came back about 117. Recommend low sugar diet.  Bilateral trapezius tenderness on exam. Maybe related to tension. Can use ibuprofen on occasional as you report doing. Can also add on low dose flexeril to use for more severe trapezius pain/tightness.   If upper back thoracic region pain worsens then get t spine xray.  For high cholesterol and history of reported fatty liver get cmp and lipid panel.   Discussed TMJ and advise you get advise from your dentist. Below is education information attached.  Follow up date to be determined after lab review.  Sndrome de la articulacin temporomandibular Temporomandibular Joint Syndrome  El sndrome de Sports coach (sndrome de ATM) es una afeccin que causa dolor en las articulaciones temporomandibulares. Estas articulaciones estn ubicadas cerca de las orejas y permiten abrir y Passenger transport manager. Las Development worker, community por el sndrome de ATM pueden tener dificultades o sentir dolor al Engineer, manufacturing systems, morder o Field seismologist otros movimientos con la [place]. El sndrome de ATM suele ser leve y desaparece en unas pocas semanas. En ocasiones, sin embargo, la afeccin se Presenter, broadcasting en un problema a Barrister's clerk (crnico). Cules son las causas? Esta afeccin puede ser causada por lo siguiente: Rechinar los dientes o apretar la [place]. Algunas personas lo hacen cuando estn estresadas. Artritis. Una lesin mandibular. Una lesin en la cabeza o el cuello. Piezas dentales o dentaduras postizas que no estn bien alineadas. En algunos casos, es posible que la causa de este sndrome no se conozca. Cules son los signos o los sntomas? El sntoma ms comn de esta afeccin es un dolor continuo en el lado de la cabeza, en la zona de la articulacin temporomandibular. Otros sntomas pueden incluir: Dolor al [employer], por ejemplo, al [employer] Net o morder. Imposibilidad de abrir  [employer]. Producir un chasquido al abrir la boca. Dolor de [place]. Dolor de odos. Dolor en el cuello o el hombro. Cmo se diagnostica? Esta afeccin se puede diagnosticar en funcin de lo siguiente: Los sntomas y los antecedentes mdicos. Un examen fsico. El mdico puede revisar el rango de movimiento de la [place]. Pruebas de diagnstico por imgenes, como radiografas o una resonancia magntica (RM). Tal vez deba consultar al dentista, quien revisar si las piezas dentales y la mandbula estn alineadas correctamente. Cmo se trata? El sndrome de ATM suele desaparecer solo. Si se necesita tratamiento, este puede incluir lo siguiente: Consumir alimentos blandos y Midwife hielo o calor. Medicamentos para Best boy o la inflamacin. Medicamentos o masajes para [employer]. Una frula dental, una placa de mordida o una boquilla para evitar que se rechinen los dientes o se aprieten las [employer]. Tcnicas de relajacin o psicoterapia para ayudar a Software engineer. Tratamiento para Conservation officer, historic buildings en el que se aplica corriente Art therapist a los nervios a travs de la piel (estimulacin nerviosa elctrica transcutnea). Acupuntura. Esto puede ayudar a Best boy. Ciruga de [place]. Esto debe realizarse en contadas ocasiones. Siga estas instrucciones en su casa:  Comida y bebida Consuma una dieta blanda si tiene dificultades para Engineer, manufacturing systems. No consuma los alimentos que Energy manager. No mastique goma de Higher education careers adviser. Instrucciones generales Use los medicamentos de venta libre y los recetados solamente como se lo haya indicado el mdico. Si se lo indican, aplique hielo sobre la zona dolorida. Para hacer esto: Ponga el hielo en una bolsa plstica. Coloque una toalla entre la piel y Therapist, nutritional. Aplique el hielo durante  20 minutos, 2 o 3 veces por da. Retire el hielo si la piel se pone de color rojo brillante. Esto es [employer]. Si no puede  sentir dolor, calor o fro, tiene un mayor riesgo de que se dae la zona. Aplique un pao mojado y tibio (compresa tibia) sobre la zona dolorida como se lo hayan indicado. Dese un masaje en la zona de la mandbula y haga los ejercicios de estiramiento como se lo haya indicado el mdico. Si le indicaron una frula dental, una placa de mordida o una boquilla, utilcela como se lo haya indicado el mdico. Concurra a todas las visitas de seguimiento. Esto es importante. Dnde buscar ms informacin [place] ([facility]): [email or web address] Comunquese con un mdico si: Tiene dificultad para comer. Tiene sntomas nuevos o sus sntomas empeoran. Solicite ayuda de inmediato si: Se le traba la mandbula. Resumen El sndrome de la articulacin temporomandibular (sndrome de ATM) es una afeccin que causa dolor en las articulaciones temporomandibulares. Estas articulaciones estn ubicadas cerca de las orejas y permiten abrir y Passenger transport manager. El sndrome de ATM suele ser leve y desaparece en unas pocas semanas. En ocasiones, sin embargo, la afeccin se Presenter, broadcasting en un problema a Barrister's clerk (crnico). Los sntomas incluyen un dolor continuo en el lado de la cabeza, en la zona de la articulacin temporomandibular, dolor al [employer] Net o morder e incapacidad para abrir la mandbula totalmente. Tambin puede producir un chasquido al abrir la boca. El sndrome de ATM suele desaparecer solo. En caso de Warden/ranger, este puede incluir medicamentos para Best boy y reducir la inflamacin o para Scientist, research (life sciences). Tambin pueden utilizarse una frula dental, una placa de mordida o una boquilla para evitar que se rechinen los dientes o se aprieten las mandbulas. Esta informacin no tiene Marine scientist el consejo del mdico. Asegrese de hacerle al mdico cualquier pregunta que tenga. Document  Revised: 03/12/2021 Document Reviewed: 03/12/2021 Elsevier Patient Education  [employer].

## 2022-07-21 NOTE — Progress Notes (Signed)
   Subjective:    Patient ID: Taylor Parker, female    DOB: 06-07-1966, 57 y.o.   MRN: [id number]  HPI Pt in for follow up .  Last visit saw for below in ".  " For fatigue - will order cbc, cmp, tsh, t4, b12, iron and b1 level.   For elevated sugar get A1c.   Recommend trying to getting 8 hours sleep at night as you not only 6 hours at night. In addition regular exercise and eat healthy.    Considered urine studies today but lack of symptoms and no pain over bladder on palpation.     "  Pt states no longer fatigued. Eating healthy. Sleeping well.    High cholesterol- last May, 2023 levels were high.  The 10-year ASCVD risk score (Arnett DK, et al., 2019) is: 2.3%   Values used to calculate the score:     Age: 77 years     Sex: Female     Is Non-Hispanic African American: No     Diabetic: No     Tobacco smoker: No     Systolic Blood Pressure: 782 mmHg     Is BP treated: No     HDL Cholesterol: 39 mg/dL     Total Cholesterol: 230 mg/dL    Pt has some trapezius pain at times late at night. She notes it late in day after cleaning her house.  She indicates this is present for about one month. Will use ibuprofen if pain is high level. Also notes pain upper t spine. No mid cervical pain. No radiating pain to upper ext.  Also not crepitus for year both sides.   Review of Systems  Constitutional:  Negative for chills, fatigue and fever.  HENT:  Negative for congestion.   Respiratory:  Negative for cough, chest tightness, shortness of breath and wheezing.   Cardiovascular:  Negative for chest pain and palpitations.  Gastrointestinal:  Negative for abdominal distention, abdominal pain and blood in stool.  Genitourinary:  Negative for dysuria.  Musculoskeletal:  Positive for back pain and neck pain. Negative for joint swelling and myalgias.  Neurological:  Negative for dizziness, speech difficulty, numbness and headaches.  Hematological:  Negative for adenopathy.  Does not bruise/bleed easily.  Psychiatric/Behavioral:  Negative for behavioral problems and confusion.          Objective:   Physical Exam  General- No acute distress. Pleasant patient. Neck- Full range of motion, no jvd bilateral trap tendereness. No mid c spine pain. Lungs- Clear, even and unlabored. Heart- regular rate and rhythm. Neurologic- CNII- XII grossly intact.  Back- mild mid upper t spine tenderness.       Assessment & Plan:   Fatigue resolved and labs were normal.  Sugar average came back about 117. Recommend low sugar diet.  Bilateral trapezius tenderness on exam. Maybe related to tension. Can use ibuprofen on occasional as you report doing. Can also add on low dose flexeril to use for more severe trapezius pain/tightness.   If upper back thoracic region pain worsens then get t spine xray.  For high cholesterol and history of reported fatty liver get cmp and lipid panel.   Discussed TMJ and advise you get advise from your dentist. Below is education information attached.  Follow up date to be determined after lab review.

## 2022-08-26 ENCOUNTER — Other Ambulatory Visit (HOSPITAL_BASED_OUTPATIENT_CLINIC_OR_DEPARTMENT_OTHER): Payer: Self-pay

## 2022-08-26 ENCOUNTER — Ambulatory Visit (INDEPENDENT_AMBULATORY_CARE_PROVIDER_SITE_OTHER): Payer: Self-pay | Admitting: Medical

## 2022-08-26 VITALS — BP 100/60 | HR 80 | Temp 97.5°F | Resp 18 | Ht 62.0 in | Wt 182.2 lb

## 2022-08-26 DIAGNOSIS — U071 COVID-19: Secondary | ICD-10-CM

## 2022-08-26 DIAGNOSIS — R52 Pain, unspecified: Secondary | ICD-10-CM

## 2022-08-26 DIAGNOSIS — R059 Cough, unspecified: Secondary | ICD-10-CM

## 2022-08-26 LAB — POCT INFLUENZA A/B
Influenza A, POC: NEGATIVE
Influenza B, POC: NEGATIVE

## 2022-08-26 LAB — POC COVID19 BINAXNOW: SARS Coronavirus 2 Ag: POSITIVE — AB

## 2022-08-26 MED ORDER — FLUTICASONE PROPIONATE 50 MCG/ACT NA SUSP
2.0000 | Freq: Every day | NASAL | 1 refills | Status: DC
  Filled 2022-08-26: qty 16, 30d supply, fill #0

## 2022-08-26 MED ORDER — AZITHROMYCIN 250 MG PO TABS
ORAL_TABLET | ORAL | 0 refills | Status: AC
  Filled 2022-08-26: qty 6, 5d supply, fill #0

## 2022-08-26 MED ORDER — BENZONATATE 100 MG PO CAPS
100.0000 mg | ORAL_CAPSULE | Freq: Three times a day (TID) | ORAL | 0 refills | Status: DC | PRN
  Filled 2022-08-26: qty 30, 10d supply, fill #0

## 2022-08-26 NOTE — Patient Instructions (Addendum)
Mild to moderate COVID infection day 2 of symptoms.  Formally vaccinated 2 times and you also report 2 times having COVID in the past.  Previously recovered from [place] without having to use antivirals.  I did call downstairs to ask about the price of antivirals since you do not have insurance.  Pharmacist only price was in excess of $1000.  On discussion you opted not to use.  I think that is reasonable in light of your medical history and age.  Prescribing Flonase for nasal congestion, benzonatate for cough and making azithromycin antibiotic available if you were to get worse sinus pressure, ear pain or chest congestion.  Symptoms secondary infections can occur after viral infection.  Recommend use vitamin D over-the-counter 4000 international unit tab daily.  Signs and symptoms worsen or change let us know.  For body aches or fever can use combination of medium dose ibuprofen and Tylenol.  Follow-up in 7 to 10 days for any lingering symptoms or sooner if needed.

## 2022-08-26 NOTE — Progress Notes (Signed)
Subjective:    Patient ID: Taylor Parker, female    DOB: [birth year], 57 y.o.   MRN: [id number]  HPI  Pt in with 2 days of nasal congestion, sinus pressure cough and ear pain.   Also has mild body aches and feeling warm.  No sob, no wheezing and no chest pain.    Review of Systems  Constitutional:  Negative for chills, fatigue and fever.       Subjective fever.   HENT:  Positive for congestion, sinus pressure and sinus pain. Negative for postnasal drip and sore throat.   Respiratory:  Negative for cough, chest tightness, shortness of breath and wheezing.   Cardiovascular:  Negative for chest pain and palpitations.  Gastrointestinal:  Negative for abdominal pain, blood in stool, diarrhea, rectal pain and vomiting.  Genitourinary:  Negative for difficulty urinating and dysuria.  Musculoskeletal:  Negative for back pain, joint swelling, myalgias and neck stiffness.       Mid body aches.  Skin:  Negative for rash.  Neurological:  Negative for facial asymmetry and headaches.  Hematological:  Negative for adenopathy. Does not bruise/bleed easily.  Psychiatric/Behavioral:  Negative for behavioral problems and decreased concentration.     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;   hx ablation left GSV 2013 in [place];   left small SV laser ablation @[facility]  06-09-2017   Wears glasses      Social History   Socioeconomic History    Marital status: Married    Spouse name: Not on file   Number of children: 3   Years of education: Not on file   Highest education level: 9th grade  Occupational History   Occupation: [employer]  7pm-4 am  Tobacco Use   Smoking status: Never   Smokeless tobacco: Never  Vaping Use   Vaping Use: Never used  Substance and Sexual Activity   Alcohol use: Yes    Comment: occ   Drug use: Never   Sexual activity: Yes    Partners: Male    Birth control/protection: Surgical    Comment: 1st intercourse- 18, partners- 2, married- 62 yrs   Other Topics Concern   Not on file  Social History Narrative   Original from [place]   Houshold: pt, husband, son and G-son       Social Determinants of Health   Financial Resource Strain: Not on file  Food Insecurity: No Food Insecurity (05/18/2022)   Hunger Vital Sign    Worried About Running Out of Food in the Last Year: Never true    Ran Out of Food in the Last Year: Never true  Transportation Needs: No Transportation Needs (05/18/2022)   PRAPARE - Hydrologist (Medical): No    Lack of Transportation (Non-Medical): No  Physical  Activity: Not on file  Stress: Not on file  Social Connections: Not on file  Intimate Partner Violence: Not on file    Past Surgical History:  Procedure Laterality Date   BLADDER SURGERY  12/09/2016   per pt sling   BREAST BIOPSY  06/19/2011   Procedure: BREAST BIOPSY;  Surgeon: Rolm Bookbinder, MD;  Location: [facility];  Service: General;  Laterality: Right;  Right breast chronic abscess drainage and debridement   BREAST DUCTAL SYSTEM EXCISION Left 01/24/2014   Procedure:  LEFT BREAST DUCT EXCISION;  Surgeon: Rolm Bookbinder, MD;  Location: [facility];  Service: General;  Laterality: Left;   CESAREAN SECTION Bilateral 03/15/2003   x3  first two in [place];  last one @[facility]  ;   WITH BILATERAL TUBAL LIGATION   COLONOSCOPY  08/15/2021   dr Eber Jones   DILATATION &  CURETTAGE/HYSTEROSCOPY WITH MYOSURE N/A 05/10/2014   Procedure: DILATATION & CURETTAGE/HYSTEROSCOPY WITH MYOSURE ABLATION,RESECTOSCOPIC POLYPECTOMY;  Surgeon: Terrance Mass, MD;  Location: [facility];  Service: Gynecology;  Laterality: N/A;  Confirmed with [employer] rep to be present.   DILITATION & CURRETTAGE/HYSTROSCOPY WITH HYDROTHERMAL ABLATION N/A 06/16/2022   Procedure: DILATATION & CURETTAGE/HYSTEROSCOPY WITH HYDROTHERMAL ABLATION;  Surgeon: Mora Bellman, MD;  Location: [facility];  Service: Gynecology;  Laterality: N/A;   INCISE AND DRAIN ABCESS Right 03/05/2011   @[facility]   by dr Donne Hazel;   I&D right breast abscess and incisional bx   IR EMBO VENOUS NOT HEMORR HEMANG  INC GUIDE ROADMAPPING  06/09/2017   IR RADIOLOGIST EVAL & MGMT  05/04/2017   IR RADIOLOGIST EVAL & MGMT  06/29/2017   VARICOSE VEIN SURGERY Left 2013   in [place];  left leg    Family History  Problem Relation Age of Onset   Heart disease Mother    Diabetes Other        gm   High Cholesterol Brother        high TG   Colon cancer Neg Hx    Breast cancer Neg Hx    Esophageal cancer Neg Hx    Stomach cancer Neg Hx    Rectal cancer Neg Hx     No Known Allergies  No current outpatient medications on file prior to visit.   No current facility-administered medications on file prior to visit.    BP 100/60   Pulse 80   Temp (!) 97.5 F (36.4 C)   Resp 18   Ht 5' 2"$  (1.575 m)   Wt 182 lb 3.2 oz (82.6 kg)   LMP 08/31/2020 (Exact Date)   SpO2 90%   BMI 33.32 kg/m        Objective:   Physical Exam  General Mental Status- Alert. General Appearance- Not in acute distress.   Skin General: Color- Normal Color. Moisture- Normal Moisture.  Neck Carotid Arteries- Normal color. Moisture- Normal Moisture. No carotid bruits. No JVD.  Chest and Lung Exam Auscultation: Breath Sounds:-Normal.  Cardiovascular Auscultation:Rythm- Regular. Murmurs & Other Heart Sounds:Auscultation of the heart  reveals- No Murmurs.  Abdomen Inspection:-Inspeection Normal. Palpation/Percussion:Note:No mass. Palpation and Percussion of the abdomen reveal- Non Tender, Non Distended + BS, no rebound or guarding.    Neurologic Cranial Nerve exam:- CN III-XII intact(No nystagmus), symmetric smile. Strength:- 5/5 equal and symmetric strength both upper and lower extremities.   Heent- mild sinus pressure. Faint dull  TMs bilaterally.    Assessment & Plan:   Patient Instructions  Mild to moderate COVID infection day 2 of symptoms.  Formally vaccinated 2 times and you also report 2 times having COVID in the past.  Previously recovered from [facility] without having to use antivirals.  I did call downstairs to ask about the price of antivirals since you do not have insurance.  Pharmacist only price was in excess of $1000.  On discussion you opted not to use.  I think that is reasonable in light of your medical history and age.  Prescribing Flonase for nasal congestion, benzonatate for cough and making azithromycin antibiotic available if you were to get worse sinus pressure, ear pain or chest congestion.  Symptoms secondary infections can occur after viral infection.  Recommend use vitamin D over-the-counter 4000 international unit tab daily.  Signs and symptoms worsen or change let us know.  Follow-up in 7 to 10 days for any lingering symptoms or sooner if needed.   Mackie Pai, PA-C

## 2022-09-10 ENCOUNTER — Other Ambulatory Visit: Payer: Self-pay

## 2022-09-10 DIAGNOSIS — Z1231 Encounter for screening mammogram for malignant neoplasm of breast: Secondary | ICD-10-CM

## 2022-11-10 ENCOUNTER — Ambulatory Visit (INDEPENDENT_AMBULATORY_CARE_PROVIDER_SITE_OTHER): Payer: Self-pay | Admitting: Obstetrics and Gynecology

## 2022-11-10 ENCOUNTER — Encounter: Payer: Self-pay | Admitting: Obstetrics and Gynecology

## 2022-11-10 ENCOUNTER — Other Ambulatory Visit (HOSPITAL_COMMUNITY)
Admission: RE | Admit: 2022-11-10 | Discharge: 2022-11-10 | Disposition: A | Discharge status: Home / Self Care | Payer: Self-pay | Source: Ambulatory Visit | Attending: Obstetrics and Gynecology | Admitting: Obstetrics and Gynecology

## 2022-11-10 VITALS — BP 102/65 | HR 91 | Ht 62.0 in | Wt 185.0 lb

## 2022-11-10 DIAGNOSIS — N761 Subacute and chronic vaginitis: Secondary | ICD-10-CM | POA: Insufficient documentation

## 2022-11-10 DIAGNOSIS — N950 Postmenopausal bleeding: Secondary | ICD-10-CM

## 2022-11-10 NOTE — Progress Notes (Signed)
Pt presents with concerns of dark brown vaginal bleeding and blood clots for the last 30 days. Wears 1 pad a day. Denies cramping or abdominal pain. Endorses vaginal itching, denies vaginal discharge, odor or irritation.

## 2022-11-10 NOTE — Progress Notes (Signed)
57 yo P3 postmenopausal presenting for evaluation of vaginal spotting for a month. Patient describes the bleeding as dark spotting needing one panty liner per day. She is sexually active without complaints. She had an endometrial ablation in 06/2022 and reports no vaginal bleeding until this past month. She denies any pelvic pain  Past Medical History:  Diagnosis Date   Anxiety    Bilateral low back pain with bilateral sciatica    Bursitis of hip    Headache(784.0)    otc med prn   History of abnormal cervical Pap smear    History of DVT of lower extremity 10/2002   4 months preg. 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;   hx ablation left GSV 2013 in [place];   left small SV laser ablation [facility]  06-09-2017   Wears glasses    Past Surgical History:  Procedure Laterality Date   BLADDER SURGERY  12/09/2016   per pt sling   BREAST BIOPSY  06/19/2011   Procedure: BREAST BIOPSY;  Surgeon: Emelia Loron, MD;  Location: [facility];  Service: General;  Laterality: Right;  Right breast chronic abscess drainage and debridement   BREAST DUCTAL SYSTEM EXCISION Left 01/24/2014   Procedure:  LEFT BREAST DUCT EXCISION;  Surgeon: Emelia Loron, MD;  [facility];  Service: General;  Laterality: Left;   CESAREAN SECTION Bilateral 03/15/2003   x3  first two in [place];  last one @[facility]  ;   WITH BILATERAL TUBAL LIGATION   COLONOSCOPY  08/15/2021   dr Gwendalyn Ege   DILATATION & CURETTAGE/HYSTEROSCOPY WITH MYOSURE N/A 05/10/2014   Procedure: DILATATION & CURETTAGE/HYSTEROSCOPY WITH MYOSURE ABLATION,RESECTOSCOPIC POLYPECTOMY;  Surgeon: Ok Edwards, MD;  Location: [facility];  Service: Gynecology;   Laterality: N/A;  Confirmed with [employer] rep to be present.   DILITATION & CURRETTAGE/HYSTROSCOPY WITH HYDROTHERMAL ABLATION N/A 06/16/2022   Procedure: DILATATION & CURETTAGE/HYSTEROSCOPY WITH HYDROTHERMAL ABLATION;  Surgeon: Catalina Antigua, MD;  Location: [facility];  Service: Gynecology;  Laterality: N/A;   INCISE AND DRAIN ABCESS Right 03/05/2011   @[facility]   by dr Dwain Sarna;   I&D right breast abscess and incisional bx   IR EMBO VENOUS NOT HEMORR HEMANG  INC GUIDE ROADMAPPING  06/09/2017   IR RADIOLOGIST EVAL & MGMT  05/04/2017   IR RADIOLOGIST EVAL & MGMT  06/29/2017   VARICOSE VEIN SURGERY Left 2013   in [place];  left leg   Family History  Problem Relation Age of Onset   Heart disease Mother    Diabetes Other        gm   High Cholesterol Brother        high TG   Colon cancer Neg Hx    Breast cancer Neg Hx    Esophageal cancer Neg Hx    Stomach cancer Neg Hx    Rectal cancer Neg Hx    Social History   Tobacco Use   Smoking status: Never   Smokeless tobacco: Never  Vaping Use   Vaping Use: Never used  Substance Use Topics   Alcohol use: Yes    Comment: occ   Drug use: Never  ROS See pertinent in HPI. All other systems reviewed and non contributory Blood pressure 102/65, pulse 91, height 5\' 2"  (1.575 m), weight 185 lb (83.9 kg), last menstrual period 08/31/2020. GENERAL: Well-developed, well-nourished female in no acute distress.  LUNGS: Clear to auscultation bilaterally.  HEART: Regular rate and rhythm. ABDOMEN: Soft, nontender, nondistended. No organomegaly. PELVIC: Normal external female genitalia. Vagina is pink and rugated.  Coffee ground discharge. Normal appearing cervix. Uterus is normal in size. No adnexal mass or tenderness. Chaperone present during the pelvic exam EXTREMITIES: No cyanosis, clubbing, or edema, 2+ distal pulses.  A/P 57 yo postmenopausal with vaginal bleeding s/p ablation - vaginal swab collected - pelvic ultrasound ordered -  Patient will be contacted with abnormal results

## 2022-11-12 ENCOUNTER — Ambulatory Visit: Payer: Self-pay | Admitting: Hematology and Oncology

## 2022-11-12 ENCOUNTER — Ambulatory Visit
Admission: RE | Admit: 2022-11-12 | Discharge: 2022-11-12 | Disposition: A | Discharge status: Home / Self Care | Payer: No Typology Code available for payment source | Source: Ambulatory Visit | Attending: Medical | Admitting: Medical

## 2022-11-12 VITALS — BP 103/44 | Wt 186.0 lb

## 2022-11-12 DIAGNOSIS — Z1231 Encounter for screening mammogram for malignant neoplasm of breast: Secondary | ICD-10-CM

## 2022-11-12 LAB — CERVICOVAGINAL ANCILLARY ONLY
Bacterial Vaginitis (gardnerella): NEGATIVE
Candida Glabrata: NEGATIVE
Candida Vaginitis: NEGATIVE
Comment: NEGATIVE
Comment: NEGATIVE
Comment: NEGATIVE

## 2022-11-12 NOTE — Progress Notes (Signed)
Ms. Jessicamarie Amiri is a 57 y.o. female who presents to [facility] clinic today with complaint of left nipple itching.    Pap Smear: Pap smear not completed today. Last Pap smear was 03/24/2022 at [facility] and was abnormal - LSIL/ HPV- . Per patient has no history of an abnormal Pap smear. Last Pap smear result is available in Epic.   Physical exam: Breasts Breasts symmetrical. No skin abnormalities bilateral breasts. No nipple retraction bilateral breasts. No nipple discharge bilateral breasts. No lymphadenopathy. No lumps palpated bilateral breasts.       Pelvic/Bimanual Pap is not indicated today    Smoking History: Patient has never smoked and was not referred to quit line.    Patient Navigation: Patient education provided. Access to services provided for patient through [facility] program. Alis interpreter provided. No transportation provided   Colorectal Cancer Screening: Per patient has had colonoscopy completed on 08/15/21 and revealed hyperplastic polyps. Repeat in 5 years.  No complaints today.    Breast and Cervical Cancer Risk Assessment: Patient does not have family history of breast cancer, known genetic mutations, or radiation treatment to the chest before age 96. Patient has history of cervical dysplasia, immunocompromised, or DES exposure in-utero.  Risk Scores as of 11/12/2022     Dondra Spry           5-year 1.3 %   Lifetime 8.49 %   This patient is [place] but has no documented birth country, so the [place] model used data from [facility] patients to calculate their risk score. Document a birth country in the Demographics activity for a more accurate score.         Last calculated by Caprice Red, CMA on 11/12/2022 at  9:16 AM        A: [facility] exam without pap smear Complaint of right nipple itching. Benign exam. Continues to have dark blood spotting after endometrial ablation in December 2023. Is scheduled for ultrasound on Saturday.   P: Referred patient to  the [facility] for a screening mammogram. Appointment scheduled 11/12/22.  Pascal Lux, NP 11/12/2022 9:37 AM

## 2022-11-12 NOTE — Patient Instructions (Signed)
Taught Taylor Parker about self breast awareness and gave educational materials to take home. Patient did not need a Pap smear today due to last Pap smear was in 03/24/22 per patient.  We will repeat in September this year. Let her know B[facility]CP will cover Pap smears every 5 years unless has a history of abnormal Pap smears. Referred patient to the [facility] for screening mammogram. Appointment scheduled for 11/12/22. Patient aware of appointment and will be there. Let patient know will follow up with her within the next couple weeks with results. Taylor Parker verbalized understanding.  Taylor Lux, NP 9:42 AM

## 2022-11-14 ENCOUNTER — Ambulatory Visit (HOSPITAL_BASED_OUTPATIENT_CLINIC_OR_DEPARTMENT_OTHER)
Admission: RE | Admit: 2022-11-14 | Discharge: 2022-11-14 | Disposition: A | Discharge status: Home / Self Care | Payer: Self-pay | Source: Ambulatory Visit | Attending: Obstetrics and Gynecology | Admitting: Obstetrics and Gynecology

## 2022-11-14 DIAGNOSIS — N950 Postmenopausal bleeding: Secondary | ICD-10-CM | POA: Insufficient documentation

## 2022-11-17 ENCOUNTER — Telehealth: Payer: Self-pay | Admitting: *Deleted

## 2022-11-17 NOTE — Telephone Encounter (Signed)
[facility] called with urgent US report for patient. Report forwarded to Dr. Jolayne Panther.

## 2022-11-26 ENCOUNTER — Other Ambulatory Visit (HOSPITAL_BASED_OUTPATIENT_CLINIC_OR_DEPARTMENT_OTHER): Payer: Self-pay

## 2022-11-26 ENCOUNTER — Ambulatory Visit (INDEPENDENT_AMBULATORY_CARE_PROVIDER_SITE_OTHER): Payer: Self-pay | Admitting: Medical

## 2022-11-26 VITALS — BP 100/60 | HR 64 | Temp 98.2°F | Resp 18 | Ht 62.0 in | Wt 184.0 lb

## 2022-11-26 DIAGNOSIS — E669 Obesity, unspecified: Secondary | ICD-10-CM

## 2022-11-26 DIAGNOSIS — G8929 Other chronic pain: Secondary | ICD-10-CM

## 2022-11-26 DIAGNOSIS — M25561 Pain in right knee: Secondary | ICD-10-CM

## 2022-11-26 DIAGNOSIS — M25551 Pain in right hip: Secondary | ICD-10-CM

## 2022-11-26 MED ORDER — METFORMIN HCL 500 MG PO TABS
500.0000 mg | ORAL_TABLET | Freq: Two times a day (BID) | ORAL | 3 refills | Status: DC
  Filled 2022-11-26: qty 30, 15d supply, fill #0
  Filled 2022-12-22: qty 30, 15d supply, fill #1

## 2022-11-26 MED ORDER — TRAMADOL HCL 50 MG PO TABS
50.0000 mg | ORAL_TABLET | Freq: Three times a day (TID) | ORAL | 0 refills | Status: AC | PRN
  Filled 2022-11-26: qty 15, 5d supply, fill #0

## 2022-11-26 NOTE — Progress Notes (Signed)
Subjective:    Patient ID: Taylor Parker, female    DOB: July 22, 1965, 57 y.o.   MRN: [id number]  HPI  Pt in with some persistent intermittent rt knee swelling. Pt states given prednisone taper dose and then told to take meloxicam.  Overall pt tells me pain is getting worse.   Pt states went to [facility]. She indicates may want second opinion. She states that while she was on prednisone pain did not subside. Pt states [facility] did xrays. She states years ago went to place that gave her injections and said she may need advance imaging.  Pt states she thinks prior fracture in her pelvis when was 57 yo may be factor in her rt knee pain.   I don't see any note scanned to media from orthopedist.  Pt states when takes meloxicam pain level is 8. At times without level of pain is 10. She tries to rest at night.  Did find old xray in 2021 showed mild detgeneraitve changes to knee.   Review of Systems  Constitutional:  Negative for chills, fatigue and unexpected weight change.  Respiratory:  Negative for cough, chest tightness, shortness of breath and wheezing.   Cardiovascular:  Negative for chest pain and palpitations.  Gastrointestinal:  Negative for abdominal pain and blood in stool.  Musculoskeletal:  Negative for back pain and myalgias.       Rt knee pain.    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;   hx ablation  left GSV 2013 in [place];   left small SV laser ablation [facility]  06-09-2017   Wears glasses      Social History   Socioeconomic History   Marital status: Married    Spouse name: Not on file   Number of children: 3   Years of education: Not on file   Highest education level: 9th grade  Occupational History   Occupation: [employer]  7pm-4 am  Tobacco Use   Smoking status: Never   Smokeless tobacco: Never  Vaping Use   Vaping Use: Never used  Substance and Sexual Activity   Alcohol use: Yes    Comment: occ   Drug use: Never   Sexual activity: Yes    Partners: Male    Birth control/protection: Surgical    Comment: 1st intercourse- 18, partners- 2, married- 15 yrs   Other Topics Concern   Not on file  Social History Narrative   Original from [place]   Houshold: pt, husband, son and G-son       Social Determinants of Health   Financial Resource Strain: Not on file  Food Insecurity: No Food Insecurity (11/12/2022)   Hunger Vital Sign    Worried About Running Out of Food in the Last Year: Never true    Ran Out  of Food in the Last Year: Never true  Transportation Needs: No Transportation Needs (11/12/2022)   PRAPARE - Administrator, Civil Service (Medical): No    Lack of Transportation (Non-Medical): No  Physical Activity: Not on file  Stress: Not on file  Social Connections: Not on file  Intimate Partner Violence: Not on file    Past Surgical History:  Procedure Laterality Date   BLADDER SURGERY  12/09/2016   per pt sling   BREAST BIOPSY  06/19/2011   Procedure: BREAST BIOPSY;  Surgeon: Emelia Loron, MD;  Location: [facility];  Service: General;  Laterality: Right;  Right breast chronic abscess drainage and debridement   BREAST DUCTAL SYSTEM EXCISION Left 01/24/2014   Procedure:  LEFT BREAST DUCT EXCISION;  Surgeon: Emelia Loron, MD;  [facility];  Service: General;  Laterality: Left;   CESAREAN SECTION Bilateral 03/15/2003   x3  first  two in [place];  last one @[facility]  ;   WITH BILATERAL TUBAL LIGATION   COLONOSCOPY  08/15/2021   dr Gwendalyn Ege   DILATATION & CURETTAGE/HYSTEROSCOPY WITH MYOSURE N/A 05/10/2014   Procedure: DILATATION & CURETTAGE/HYSTEROSCOPY WITH MYOSURE ABLATION,RESECTOSCOPIC POLYPECTOMY;  Surgeon: Ok Edwards, MD;  Location: [facility];  Service: Gynecology;  Laterality: N/A;  Confirmed with [employer] rep to be present.   DILITATION & CURRETTAGE/HYSTROSCOPY WITH HYDROTHERMAL ABLATION N/A 06/16/2022   Procedure: DILATATION & CURETTAGE/HYSTEROSCOPY WITH HYDROTHERMAL ABLATION;  Surgeon: Catalina Antigua, MD;  Location: [facility];  Service: Gynecology;  Laterality: N/A;   INCISE AND DRAIN ABCESS Right 03/05/2011   @[facility]   by dr Dwain Sarna;   I&D right breast abscess and incisional bx   IR EMBO VENOUS NOT HEMORR HEMANG  INC GUIDE ROADMAPPING  06/09/2017   IR RADIOLOGIST EVAL & MGMT  05/04/2017   IR RADIOLOGIST EVAL & MGMT  06/29/2017   VARICOSE VEIN SURGERY Left 2013   in [place];  left leg    Family History  Problem Relation Age of Onset   Heart disease Mother    Diabetes Other        gm   High Cholesterol Brother        high TG   Colon cancer Neg Hx    Breast cancer Neg Hx    Esophageal cancer Neg Hx    Stomach cancer Neg Hx    Rectal cancer Neg Hx     No Known Allergies  Current Outpatient Medications on File Prior to Visit  Medication Sig Dispense Refill   meloxicam (MOBIC) 15 MG tablet Take by mouth.     No current facility-administered medications on file prior to visit.    BP 100/60   Pulse 64   Temp 98.2 F (36.8 C)   Resp 18   Ht 5\' 2"  (1.575 m)   Wt 184 lb (83.5 kg)   LMP 08/31/2020 (Exact Date)   SpO2 94%   BMI 33.65 kg/m        Objective:   Physical Exam  General- No acute distress. Pleasant patient. Neck- Full range of motion, no jvd Lungs- Clear, even and unlabored. Heart- regular rate and rhythm. Neurologic- CNII- XII grossly intact.  Back- no mid lumbar pain.  Pt notes some si area pain. Rt hip- mild tender to palpation over great trochanter. Rt knee- on flexion and extensions some crepitus. Some tenderness to palpation.  On evaluation and pt demonstration appear bit shorter. Pt states pelvis feels twisted.      Assessment & Plan:  Patient Instructions   Chronic pain of right knee and pain rt hip. Knee pain getting worse per pt. Will refer for second opinion. Continue meloxicam but also making tramadol available for severe pain. Recommend sparing use. Rx advisement given.  - Ambulatory referral to Sports Medicine Number to sports med to call if they don't call you is [phone]  Obesity- discussed wt loss. I recommend try [employer] app to see if can lose. After discussion offered metformin and pt wants to try.   Follow up date 2 months or sooner if needed      Time spent with patient today was 33  minutes which consisted of chart review, discussing diagnosis, treatment , referral and documentation.

## 2022-11-26 NOTE — Patient Instructions (Addendum)
Chronic pain of right knee and pain rt hip. Knee pain getting worse per pt. Will refer for second opinion. Continue meloxicam but also making tramadol available for severe pain. Recommend sparing use. Rx advisement given.  - Ambulatory referral to Sports Medicine Number to sports med to call if they don't call you is [phone]  Obesity- discussed wt loss. I recommend try [employer] app to see if can lose. After discussion offered metformin and pt wants to try.   Follow up date 2 months or sooner if needed

## 2022-11-30 ENCOUNTER — Ambulatory Visit (INDEPENDENT_AMBULATORY_CARE_PROVIDER_SITE_OTHER): Payer: Self-pay | Admitting: Obstetrics and Gynecology

## 2022-11-30 ENCOUNTER — Encounter: Payer: Self-pay | Admitting: Obstetrics and Gynecology

## 2022-11-30 VITALS — BP 105/67 | HR 64 | Ht 62.0 in | Wt 190.0 lb

## 2022-11-30 DIAGNOSIS — N950 Postmenopausal bleeding: Secondary | ICD-10-CM

## 2022-11-30 NOTE — Progress Notes (Signed)
57 yo P3 postmenopausal with persistent vaginal spotting since endometrial ablation in December here to discuss recent ultrasound results. Patient is without any new complaints  Past Medical History:  Diagnosis Date   Anxiety    Bilateral low back pain with bilateral sciatica    Bursitis of hip    Headache(784.0)    otc med prn   History of abnormal cervical Pap smear    History of DVT of lower extremity 10/2002   4 months preg. 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;   hx ablation left GSV 2013 in [place];   left small SV laser ablation @[facility]  06-09-2017   Wears glasses    Past Surgical History:  Procedure Laterality Date   BLADDER SURGERY  12/09/2016   per pt sling   BREAST BIOPSY  06/19/2011   Procedure: BREAST BIOPSY;  Surgeon: Emelia Loron, MD;  Location: [facility];  Service: General;  Laterality: Right;  Right breast chronic abscess drainage and debridement   BREAST DUCTAL SYSTEM EXCISION Left 01/24/2014   Procedure:  LEFT BREAST DUCT EXCISION;  Surgeon: Emelia Loron, MD;  Location: [facility];  Service: General;  Laterality: Left;   CESAREAN SECTION Bilateral 03/15/2003   x3  first two in [place];  last one @[facility]  ;   WITH BILATERAL TUBAL LIGATION   COLONOSCOPY  08/15/2021   dr Gwendalyn Ege   DILATATION & CURETTAGE/HYSTEROSCOPY WITH MYOSURE N/A 05/10/2014   Procedure: DILATATION & CURETTAGE/HYSTEROSCOPY WITH MYOSURE ABLATION,RESECTOSCOPIC POLYPECTOMY;  Surgeon: Ok Edwards, MD;  Location: [facility];  Service: Gynecology;  Laterality: N/A;  Confirmed with Myosure rep to be present.   DILITATION & CURRETTAGE/HYSTROSCOPY WITH HYDROTHERMAL ABLATION N/A 06/16/2022   Procedure: DILATATION  & CURETTAGE/HYSTEROSCOPY WITH HYDROTHERMAL ABLATION;  Surgeon: Catalina Antigua, MD;  Location: [facility];  Service: Gynecology;  Laterality: N/A;   INCISE AND DRAIN ABCESS Right 03/05/2011   @[facility]   by dr Dwain Sarna;   I&D right breast abscess and incisional bx   IR EMBO VENOUS NOT HEMORR HEMANG  INC GUIDE ROADMAPPING  06/09/2017   IR RADIOLOGIST EVAL & MGMT  05/04/2017   IR RADIOLOGIST EVAL & MGMT  06/29/2017   VARICOSE VEIN SURGERY Left 2013   in [place];  left leg   Family History  Problem Relation Age of Onset   Heart disease Mother    Diabetes Other        gm   High Cholesterol Brother        high TG   Colon cancer Neg Hx    Breast cancer Neg Hx    Esophageal cancer Neg Hx    Stomach cancer Neg Hx    Rectal cancer Neg Hx    Social History   Tobacco Use   Smoking status: Never   Smokeless tobacco: Never  Vaping Use   Vaping Use: Never used  Substance Use Topics   Alcohol use: Yes    Comment: occ   Drug use: Never   ROS See pertinent in HPI. All other systems reviewed and non contributory Blood pressure 105/67, pulse 64, height 5\' 2"  (1.575 m), weight 190 lb (86.2 kg), last  menstrual period 08/31/2020. GENERAL: Well-developed, well-nourished female in no acute distress.  NEURO: alert and oriented x 3  MS DIGITAL SCREENING TOMO BILATERAL  Result Date: 11/16/2022 CLINICAL DATA:  Screening. EXAM: DIGITAL SCREENING BILATERAL MAMMOGRAM WITH TOMOSYNTHESIS AND CAD TECHNIQUE: Bilateral screening digital craniocaudal and mediolateral oblique mammograms were obtained. Bilateral screening digital breast tomosynthesis was performed. The images were evaluated with computer-aided detection. COMPARISON:  Previous exam(s). ACR Breast Density Category b: There are scattered areas of fibroglandular density. FINDINGS: There are no findings suspicious for malignancy. IMPRESSION: No mammographic evidence of malignancy. A result letter of this screening mammogram will be mailed  directly to the patient. RECOMMENDATION: Screening mammogram in one year. (Code:SM-B-01Y) BI-RADS CATEGORY  1: Negative. Electronically Signed   By: Norva Pavlov M.D.   On: 11/16/2022 10:27   US PELVIC COMPLETE WITH TRANSVAGINAL  Result Date: 11/16/2022 CLINICAL DATA:  N 95.0 postmenopausal bleeding/spotting for 4-5 months, past history of endometrial ablation EXAM: TRANSABDOMINAL AND TRANSVAGINAL ULTRASOUND OF PELVIS TECHNIQUE: Both transabdominal and transvaginal ultrasound examinations of the pelvis were performed. Transabdominal technique was performed for global imaging of the pelvis including uterus, ovaries, adnexal regions, and pelvic cul-de-sac. It was necessary to proceed with endovaginal exam following the transabdominal exam to visualize the ovaries and endometrium. COMPARISON:  05/06/2022 FINDINGS: Uterus Measurements: 9.9 x 4.8 x 5.7 cm = volume: 142 mL. Anteverted. No focal mass. Endometrium Thickness: 21 mm. Thickened, heterogeneous, abnormal. Trace nonspecific endocervical canal fluid. Right ovary Measurements: 3.2 x 2.1 x 2.0 cm = volume: 7 mL. Simple cyst RIGHT ovary 2.3 cm diameter; No followup imaging recommended. Note: This recommendation does not apply to premenarchal patients or to those with increased risk (genetic, family history, elevated tumor markers or other high-risk factors) of ovarian cancer. Reference: Radiology 2019 Nov; 293(2):359-371. Left ovary Not visualized, likely obscured by bowel Other findings No free pelvic fluid or adnexal masses. IMPRESSION: Nonvisualization of LEFT ovary. Abnormal markedly thickened heterogeneous endometrial complex 21 mm thick; In the setting of post-menopausal bleeding, endometrial sampling is indicated to exclude carcinoma. If results are benign, sonohysterogram should be considered for focal lesion work-up. (Ref: Radiological Reasoning: Algorithmic Workup of Abnormal Vaginal Bleeding with Endovaginal Sonography and Sonohysterography. AJR  2008; [id number]:J19-14) These results will be called to the ordering clinician or representative by the Radiologist Assistant, and communication documented in the PACS or [employer]. Electronically Signed   By: Ulyses Southward M.D.   On: 11/16/2022 08:33    06/2022 endometrial biopsy ENDOMETRIAL CURETTAGE  -    Disordered proliferative endometrium.  -    Negative for diagnostic features of endometrial hyperplasia.  -    Negative for intraepithelial neoplasia (EIN) and malignancy.  -    Benign endocervical fragments.   A/P 57 yo postmenopausal with persistent postmenopausal vaginal bleeding - Discussed failure of endometrial ablation performed in December 2023 - Reviewed ultrasound finding with the patient - Discussed hysterectomy as definitive treatment. Patient with history of 3 previous c-section. Patient referred to [facility] for robotic hysterectomy consultation - Patient declined medical management with megace or lysteda - Patient verbalized understanding and all questions were answered with the assistance of Spanish interpreter

## 2022-11-30 NOTE — Progress Notes (Signed)
57 y.o. GYN presents for follow up/US results.

## 2022-12-02 ENCOUNTER — Ambulatory Visit: Payer: Self-pay | Admitting: Sports Medicine
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
## 2022-12-11 ENCOUNTER — Ambulatory Visit (INDEPENDENT_AMBULATORY_CARE_PROVIDER_SITE_OTHER): Payer: Self-pay

## 2022-12-11 ENCOUNTER — Other Ambulatory Visit: Payer: Self-pay

## 2022-12-11 ENCOUNTER — Encounter: Payer: Self-pay | Admitting: Family Medicine

## 2022-12-11 ENCOUNTER — Ambulatory Visit (INDEPENDENT_AMBULATORY_CARE_PROVIDER_SITE_OTHER): Payer: Self-pay | Admitting: Family Medicine

## 2022-12-11 VITALS — BP 132/72 | HR 60 | Ht 62.0 in | Wt 184.0 lb

## 2022-12-11 DIAGNOSIS — M25551 Pain in right hip: Secondary | ICD-10-CM

## 2022-12-11 DIAGNOSIS — M25561 Pain in right knee: Secondary | ICD-10-CM

## 2022-12-11 DIAGNOSIS — M5416 Radiculopathy, lumbar region: Secondary | ICD-10-CM

## 2022-12-11 DIAGNOSIS — M1711 Unilateral primary osteoarthritis, right knee: Secondary | ICD-10-CM

## 2022-12-11 MED ORDER — GABAPENTIN 100 MG PO CAPS: 200.0000 mg | ORAL_CAPSULE | Freq: Every day | ORAL | 0 refills | Status: DC

## 2022-12-11 NOTE — Patient Instructions (Addendum)
Xrays on the way out   True pullllite Gabapentin 200 mg  Low back HEP  Gel  Can take celebrex  4-6 week follow up

## 2022-12-11 NOTE — Assessment & Plan Note (Signed)
Patient does have a patellofemoral arthritis.  We discussed the potential of a Tru lite pull brace, patient is self-pay so we will look at getting this over-the-counter.  Discussed with patient about exercises for the VMO.  Concerned though that some of the radicular symptoms patient is having is actually more of a lumbar pathology.  Will get x-rays to further evaluate for any lumbar pain that is contributing and will start patient on gabapentin as well.  Patient will follow-up with me again in 6 to 8 weeks and bring interpreter as well.

## 2022-12-11 NOTE — Assessment & Plan Note (Signed)
Concerned that leg pain is more lumbar radiculopathy.  Home exercises given, gabapentin given.  X-rays pending.  Follow-up again in 6 to 8 weeks

## 2022-12-22 ENCOUNTER — Other Ambulatory Visit (HOSPITAL_BASED_OUTPATIENT_CLINIC_OR_DEPARTMENT_OTHER): Payer: Self-pay

## 2022-12-22 ENCOUNTER — Ambulatory Visit (INDEPENDENT_AMBULATORY_CARE_PROVIDER_SITE_OTHER): Payer: Self-pay | Admitting: Medical

## 2022-12-22 ENCOUNTER — Telehealth: Payer: Self-pay

## 2022-12-22 ENCOUNTER — Ambulatory Visit (INDEPENDENT_AMBULATORY_CARE_PROVIDER_SITE_OTHER): Payer: Self-pay | Admitting: Obstetrics and Gynecology

## 2022-12-22 VITALS — BP 102/62 | HR 61 | Wt 182.4 lb

## 2022-12-22 VITALS — BP 106/60 | HR 82 | Temp 98.0°F | Resp 18 | Ht <= 58 in | Wt 184.0 lb

## 2022-12-22 DIAGNOSIS — Z86718 Personal history of other venous thrombosis and embolism: Secondary | ICD-10-CM

## 2022-12-22 DIAGNOSIS — L811 Chloasma: Secondary | ICD-10-CM

## 2022-12-22 DIAGNOSIS — W540XXA Bitten by dog, initial encounter: Secondary | ICD-10-CM

## 2022-12-22 DIAGNOSIS — L719 Rosacea, unspecified: Secondary | ICD-10-CM

## 2022-12-22 DIAGNOSIS — Z603 Acculturation difficulty: Secondary | ICD-10-CM

## 2022-12-22 DIAGNOSIS — I83812 Varicose veins of left lower extremities with pain: Secondary | ICD-10-CM

## 2022-12-22 DIAGNOSIS — N950 Postmenopausal bleeding: Secondary | ICD-10-CM

## 2022-12-22 DIAGNOSIS — Z758 Other problems related to medical facilities and other health care: Secondary | ICD-10-CM

## 2022-12-22 HISTORY — DX: Chloasma: L81.1

## 2022-12-22 HISTORY — DX: Rosacea, unspecified: L71.9

## 2022-12-22 MED ORDER — AMOXICILLIN-POT CLAVULANATE 875-125 MG PO TABS: 1.0000 | ORAL_TABLET | Freq: Two times a day (BID) | ORAL | 0 refills | Status: DC

## 2022-12-22 MED ORDER — AMOXICILLIN-POT CLAVULANATE 875-125 MG PO TABS
1.0000 | ORAL_TABLET | Freq: Two times a day (BID) | ORAL | 0 refills | Status: DC
  Filled 2022-12-22: qty 20, 10d supply, fill #0

## 2022-12-22 NOTE — Patient Instructions (Addendum)
Dog bite, initial encounter Augmentin rx to prevent infection. Personal pet that has vaccines and indoor dog. Too late to put sutures and self treated. Presently no infection. Will follow wound and hopefully won't have too much of scar. If excessive scar could refer to plastic.  Varicose vein chronic- left calf for years. Refer to vascular surgeon. If any calf swelling or popliteal pain let me know and would get [place]. Not indicated presently.  Follow up in July as already scheduled.

## 2022-12-22 NOTE — Patient Instructions (Signed)
Informacin sobre la histerectoma Hysterectomy Information  Taylor Parker histerectoma es una ciruga que se realiza para retirar el tero o el tero y la parte ms baja de este (cuello uterino), que se abre hacia la vagina. Tambin pueden retirarse los ovarios o las trompas de [place], o todos ellos. Despus del procedimiento, una mujer dejar de tener perodos menstruales y no podr [place]. Cules son los beneficios? Esta ciruga puede mejorar la calidad de vida porque alivia el dolor y el sangrado vaginal abundante. Esta ciruga tambin puede: Tratar una infeccin a largo plazo (crnica) de la zona que est entre los huesos de la cadera (pelvis). Tratar afecciones que afectan al tero. Tratar el cncer de tero o de cuello uterino. Reducir el riesgo de cncer. Tambin se puede hacer para que los hombres transgnero coincidan con su identidad de gnero. Cules son los riesgos? Por lo Taylor, esta ciruga es segura. Pero pueden presentarse problemas, que incluyen los siguientes: Sangrado. Recibir Oceanographer (transfusin). Cogulos de Taylor Parker. Infeccin. Daos a las partes o los rganos cercanos. Reacciones alrgicas a los medicamentos. Necesidad de Taylor Parker de una ciruga que requiere pequeos cortes (incisiones) a una ciruga que requiere un corte grande. Cules son los diferentes tipos? Estos son los tres tipos: Se retira la parte superior del tero, pero no el cuello uterino. Se retiran el tero y el cuello uterino. Se retiran el tero, el cuello uterino y el tejido que sostiene el tero en su Taylor Parker. Qu ocurre durante el procedimiento? Esta ciruga se puede realizar de Taylor Parker de las siguientes maneras: Se realiza un corte en el vientre (abdomen). Se retira el tero a travs de la abertura. Se hace una incisin en la vagina. Se retira el tero a travs de la abertura que se hizo. A travs de uno de los 3 o 4 cortes hechos en el vientre, se coloca un dispositivo con una  cmara para que el mdico pueda ver. El tero se extrae a travs de la vagina. A travs de uno de los 3 o 4 cortes hechos en el vientre, se coloca un dispositivo con una cmara para que el mdico pueda ver. El tero se corta en partes y se retira a travs de las aberturas o por la vagina. Una computadora ayuda a Taylor Parker las herramientas quirrgicas colocadas en los 3 o 4 cortes hechos en el vientre. El tero se corta en trozos pequeos. Las secciones se retiran a travs de las aberturas o por la vagina. Hable con su mdico para ver cul opcin es la correcta para usted. Los procedimientos pueden variar segn el mdico y el hospital. Taylor Parker ocurre despus del procedimiento? Le administrarn medicamentos para Engineer, materials. Es posible que Agricultural [facility] hospital durante 1 o 2 das. Es posible que deba pedirle a un adulto responsable que se quede con usted Taylor Parker de que se vaya a casa. Si le retiraron los ovarios, puede tener acaloramientos, sudoracin nocturna y dificultades para dormir. Hable con el mdico sobre la frecuencia con la que debe hacerse el [place]. Concurra a todas las visitas de seguimiento. Preguntas para hacerle al mdico Es necesaria esta ciruga? Qu otras opciones tengo? Qu rganos deben ser retirados? Cunto Building surveyor en el hospital? Cunto tiempo me llevar mejorar en casa? Qu sntomas pueden aparecer despus del procedimiento? Resumen La histerectoma es una ciruga que se realiza para Radiographer, therapeutic. Esto tambin se puede hacer para tratar Taylor Parker. Despus de esta ciruga, no tendr ms perodos menstruales  y no podr quedar embarazada. Hay tipos diferentes de Taylor Parker. Hable con su mdico acerca del ms adecuado para usted. Esta ciruga es segura, pero hay algunos riesgos. Esta informacin no tiene Taylor Parker el consejo del mdico. Asegrese de hacerle al mdico cualquier pregunta que  tenga. Document Revised: 06/04/2020 Document Reviewed: 05/06/2020 Elsevier Patient Education  2024 Taylor Parker.

## 2022-12-22 NOTE — Progress Notes (Signed)
GYNECOLOGY VISIT  Patient name: Taylor Parker MRN [id number]  Date of birth: 12/05/1965 Chief Complaint:   consultation  History:  Taylor Parker is a 57 y.o. [phone] being seen today for discussion of hysterectomy.  Had scraping in December and then liquid for 1 week and then brown discharge for a month, liquid brown discharge and had an [place]  8 days of no discharge, otherwise had been daily  Mild cramping like a menses Did not tolerate medication previously - mood/behavior disruption including SI  Would like to proceed with surgery given the duration of bleeding and wanting definitive management  Had DVT in pregnancy and had to be on coumadin and injection when she was pregnant. Denies having issues with coagulopathy during other procedures and has not been instructed to use anticoagulation around other surgeries Does continue to have leg pain and poor circulation     Past Medical History:  Diagnosis Date   Anxiety    Bilateral low back pain with bilateral sciatica    Bursitis of hip    Headache(784.0)    otc med prn   History of abnormal cervical Pap smear    History of DVT of lower extremity 10/2002   4 months preg. , LLE nonocclusive popliteal vein,   treated with coumadin,   History of kidney stones    Hyperlipidemia    Melasma 12/22/2022   OA (osteoarthritis)    hands,  knees   PMB (postmenopausal bleeding)    PMDD (premenstrual dysphoric disorder)    PONV (postoperative nausea and vomiting)    Pre-diabetes    Primary osteoarthritis of right knee 12/28/2019   Rosacea 12/22/2022   Varicose vein of leg    w/  vensous insuff;   hx ablation left GSV 2013 in [place];   left small SV laser ablation @[facility]  06-09-2017   Wears glasses     Past Surgical History:  Procedure Laterality Date   BLADDER SURGERY  12/09/2016   per pt sling   BREAST BIOPSY  06/19/2011   Procedure: BREAST BIOPSY;  Surgeon: Emelia Loron, MD;  Location: [facility];  Service: General;  Laterality: Right;  Right breast chronic abscess drainage and debridement   BREAST DUCTAL SYSTEM EXCISION Left 01/24/2014   Procedure:  LEFT BREAST DUCT EXCISION;  Surgeon: Emelia Loron, MD;  Location: [facility];  Service: General;  Laterality: Left;   CESAREAN SECTION Bilateral 03/15/2003   x3  first two in [place];  last one @[facility]  ;   WITH BILATERAL TUBAL LIGATION   COLONOSCOPY  08/15/2021   dr Gwendalyn Ege   DILATATION & CURETTAGE/HYSTEROSCOPY WITH MYOSURE N/A 05/10/2014   Procedure: DILATATION & CURETTAGE/HYSTEROSCOPY WITH MYOSURE ABLATION,RESECTOSCOPIC POLYPECTOMY;  Surgeon: Ok Edwards, MD;  Location: [facility];  Service: Gynecology;  Laterality: N/A;  Confirmed with My[employer] rep to be present.   DILITATION & CURRETTAGE/HYSTROSCOPY WITH HYDROTHERMAL ABLATION N/A 06/16/2022   Procedure: DILATATION & CURETTAGE/HYSTEROSCOPY WITH HYDROTHERMAL ABLATION;  Surgeon: Catalina Antigua, MD;  Location: [facility];  Service: Gynecology;  Laterality: N/A;   INCISE AND DRAIN ABCESS Right 03/05/2011   @[facility]   by dr Dwain Sarna;   I&D right breast abscess and incisional bx   IR EMBO VENOUS NOT HEMORR HEMANG  INC GUIDE ROADMAPPING  06/09/2017   IR RADIOLOGIST EVAL & MGMT  05/04/2017   IR RADIOLOGIST EVAL & MGMT  06/29/2017   VARICOSE VEIN SURGERY Left 2013   in [place];  left leg    The following portions  of the patient's history were reviewed and updated as appropriate: allergies, current medications, past family history, past medical history, past social history, past surgical history and problem list.   Health Maintenance:   Last pap     Component Value Date/Time   DIAGPAP - Low grade squamous intraepithelial lesion (LSIL) (A) 03/24/2022 1447   HPVHIGH Negative 03/24/2022 1447   ADEQPAP  03/24/2022 1447    Satisfactory for evaluation; transformation zone component ABSENT.   04/2022 FINAL MICROSCOPIC DIAGNOSIS:   A. ENDOMETRIUM, BIOPSY:  - Fragments of benign  endometrial polyp  - Negative for hyperplasia or malignancy   B. ENDOCERVIX, CURETTAGE:  - Fragments of benign endocervical epithelium with mucohemorrhagic  debris   C. CERVIX, 5 O'CLOCK, BIOPSY:  - Low-grade squamous intraepithelial lesion (LSIL/CIN-1)    06/2022 FINAL MICROSCOPIC DIAGNOSIS:   A.   ENDOMETRIAL CURETTAGE  -    Disordered proliferative endometrium.  -    Negative for diagnostic features of endometrial hyperplasia.  -    Negative for intraepithelial neoplasia (EIN) and malignancy.  -    Benign endocervical fragments.   Last mammogram: 11/2021 BIRADS 1   Review of Systems:  Pertinent items are noted in HPI. Comprehensive review of systems was otherwise negative.   Objective:  Physical Exam BP 102/62   Pulse 61   Wt 182 lb 6.4 oz (82.7 kg)   LMP 08/31/2020 (Exact Date)   BMI 33.36 kg/m    Physical Exam Vitals and nursing note reviewed.  Constitutional:      Appearance: Normal appearance.  HENT:     Head: Normocephalic and atraumatic.  Pulmonary:     Effort: Pulmonary effort is normal.  Skin:    General: Skin is warm and dry.  Neurological:     General: No focal deficit present.     Mental Status: She is alert.  Psychiatric:        Mood and Affect: Mood normal.        Behavior: Behavior normal.        Thought Content: Thought content normal.        Judgment: Judgment normal.      Labs and Imaging 11/2022 IMPRESSION: Nonvisualization of LEFT ovary.   Abnormal markedly thickened heterogeneous endometrial complex 21 mm thick; In the setting of post-menopausal bleeding, endometrial sampling is indicated to exclude carcinoma. If results are benign, sonohysterogram should be considered for focal lesion work-up. (Ref: Radiological Reasoning: Algorithmic Workup of Abnormal Vaginal Bleeding with Endovaginal Sonography and Sonohysterography. AJR 2008; 161:W96-04)   These results will be called to the ordering clinician or representative by the  Radiologist Assistant, and communication documented in the PACS or [employer].     Electronically Signed   By: Ulyses Southward M.D.   On: 11/16/2022 08:33   04/2022 IMPRESSION: 1. Thickened heterogeneous endometrium measuring up to 19 mm. In the setting of post-menopausal bleeding, endometrial sampling is indicated to exclude carcinoma. If results are benign, sonohysterogram should be considered for focal lesion work-up. (Ref: Radiological Reasoning: Algorithmic Workup of Abnormal Vaginal Bleeding with Endovaginal Sonography and Sonohysterography. AJR 2008; 540:J81-19) 2. Diffusely heterogeneous myometrium suggesting adenomyosis. 3. 4.4 cm right fundal mass with vascularity suggestive of a fibroid. Focal adenomyomatosis also be a consideration. 4. Nonvisualization of the ovaries.     Electronically Signed   By: Kennith Center M.D.   On: 05/06/2022 18:00       Assessment & Plan:   1. Postmenopausal vaginal bleeding Discussed options of continued surveillance vs surgery.  Although bleeding has stopped for now, she would like to proceed with definitive management given duration of bleeding thus far. Pelvic US virtually unchanged between pre-ablation and post ablation. Benign EMB and endometrial curttetings at the time of HTA. Will return for preop when scheduled.   Patient desires surgical management with RA-TLH, BS, cysto.  The risks of surgery were discussed in detail with the patient including but not limited to: bleeding which may require transfusion or reoperation; infection which may require prolonged hospitalization or re-hospitalization and antibiotic therapy; injury to bowel, bladder, ureters and major vessels or other surrounding organs which may lead to other procedures; formation of adhesions; need for additional procedures including laparotomy or subsequent procedures secondary to intraoperative injury or abnormal pathology; thromboembolic phenomenon; incisional problems  and other postoperative or anesthesia complications.  Patient was told that the likelihood that her condition and symptoms will be treated effectively with this surgical management was very high; the postoperative expectations were also discussed in detail. The patient also understands the alternative treatment options which were discussed in full. All questions were answered.  She was told that she will be contacted by our surgical scheduler regarding the time and date of her surgery; routine preoperative instructions will be given to her by the preoperative nursing team.  Printed patient education handouts about the procedure were given to the patient to review at home.  2. History of DVT (deep vein thrombosis) Hx of DVT in pregnancy, discussed use of perioperative AC. Given hx, will refer to HEME/ONC for perioperative [facility] planning regarding preferred [facility] and duration.  - Ambulatory referral to Hematology / Oncology  3. Language barrier Spanish interpreter used for duration of encounter  Routine preventative health maintenance measures emphasized.  Lorriane Shire, MD Minimally Invasive Gynecologic Surgery [facility], [facility]

## 2022-12-22 NOTE — Telephone Encounter (Signed)
Called [facility] and cancelled prescription for Augmentin.

## 2022-12-22 NOTE — Progress Notes (Signed)
   Subjective:    Patient ID: Taylor Parker, female    DOB: [birth year], 57 y.o.   MRN: [id number]  HPI  Pt states her 3 year dog accidentally bit her upper lip and nose. Pt states dog is up to date on vaccines particularly rabies. Pt states this happened on Saturday morning. She did not got to ED. She cleaned the area and got otc skin flu for mild cuts. She states product bought in pharmacy section.  Pt also used triple antibiotic to area.  Pt is always in doors. Pt up to date on tetanus.  Pt update me that she has seen ortho for knee pain.  Also she has seen gyn and plans to get hysterectomy.   Pt has left lower calf varicose vein. She states for years has pain. She states went to vascular surgeon in the past who did procedure but did not help.   Review of Systems See hpi.    Objective:   Physical Exam  General Mental Status- Alert. General Appearance- Not in acute distress.   Skin General: Color- Normal Color. Moisture- Normal Moisture.  Neck Carotid Arteries- Normal color. Moisture- Normal Moisture. No carotid bruits. No JVD.  Chest and Lung Exam Auscultation: Breath Sounds:-Normal.  Cardiovascular Auscultation:Rythm- Regular. Murmurs & Other Heart Sounds:Auscultation of the heart reveals- No Murmurs.  Abdomen Inspection:-Inspeection Normal. Palpation/Percussion:Note:No mass. Palpation and Percussion of the abdomen reveal- Non Tender, Non Distended + BS, no rebound or guarding.   Neurologic Cranial Nerve exam:- CN III-XII intact(No nystagmus), symmetric smile. Strength:- 5/5 equal and symmetric strength both upper and lower extremities.   Skin- mild line upper lip dog bit laceration. Wound well opposed and otc glue present. Slight scratch to nose.  Left calf- 5 inch tortuous varicose vein. No redness. No warmth. Calf not swollen. Neg homans sign.    Assessment & Plan:   Patient Instructions  Dog bite, initial encounter Augmentin rx to prevent  infection. Personal pet that has vaccines and indoor dog. Too late to put sutures and self treated. Presently no infection. Will follow wound and hopefully won't have too much of scar. If excessive scar could refer to plastic.  Varicose vein chronic- left calf for years. Refer to vascular surgeon. If any calf swelling or popliteal pain let me know and would get [place]. Not indicated presently.  Follow up in July as already scheduled.     Esperanza Richters, PA-C

## 2023-01-19 ENCOUNTER — Inpatient Hospital Stay: Payer: Self-pay

## 2023-01-19 ENCOUNTER — Inpatient Hospital Stay: Payer: Self-pay | Attending: Hematology and Oncology | Admitting: Hematology and Oncology

## 2023-01-19 VITALS — BP 126/59 | HR 71 | Temp 97.5°F | Resp 18 | Ht 62.0 in | Wt 180.4 lb

## 2023-01-19 DIAGNOSIS — M79605 Pain in left leg: Secondary | ICD-10-CM | POA: Insufficient documentation

## 2023-01-19 DIAGNOSIS — Z86718 Personal history of other venous thrombosis and embolism: Secondary | ICD-10-CM | POA: Insufficient documentation

## 2023-01-19 DIAGNOSIS — M7989 Other specified soft tissue disorders: Secondary | ICD-10-CM | POA: Insufficient documentation

## 2023-01-19 DIAGNOSIS — M79604 Pain in right leg: Secondary | ICD-10-CM | POA: Insufficient documentation

## 2023-01-19 NOTE — Assessment & Plan Note (Signed)
06/16/2017: Thrombus left saphenopopliteal junction and left popliteal vein

## 2023-01-19 NOTE — Progress Notes (Signed)
[facility] CONSULT NOTE  Patient Care Team: Saguier, [name] as PCP - General (Internal Medicine) Ok Edwards, MD (Inactive) (Obstetrics and Gynecology) Priscille Heidelberg, RN  CHIEF COMPLAINTS/PURPOSE OF CONSULTATION:  History of DVT, complains of left leg swelling  HISTORY OF PRESENTING ILLNESS:  Taylor Parker 57 y.o. female is here because of prior history of DVT in 2018 that was treated with 6 months of anticoagulation.  However patient tells me that after each of her delivery she has had leg swelling.  In fact during the second pregnancy she was given anticoagulation the entire pregnancy and also to Coumadin post pregnancy.  It is not clear why that did not.  She does not remember having been diagnosed with any hypercoagulable risk factor.  Over the past 10 years she has had persistent left leg swelling.  There were previous ultrasound evaluations that were benign.  She was referred to [place] for further evaluation and treatment because she is not needing to undergo a hysterectomy and the surgery team would like to know if she is at risk for blood clot.  I reviewed her records extensively and collaborated the history with the patient.  MEDICAL HISTORY:  Past Medical History:  Diagnosis Date   Anxiety    Bilateral low back pain with bilateral sciatica    Bursitis of hip    Headache(784.0)    otc med prn   History of abnormal cervical Pap smear    History of DVT of lower extremity 10/2002   4 months preg. , LLE nonocclusive popliteal vein,   treated with coumadin,   History of kidney stones    Hyperlipidemia    Melasma 12/22/2022   OA (osteoarthritis)    hands,  knees   PMB (postmenopausal bleeding)    PMDD (premenstrual dysphoric disorder)    PONV (postoperative nausea and vomiting)    Pre-diabetes    Primary osteoarthritis of right knee 12/28/2019   Rosacea 12/22/2022   Varicose vein of leg    w/  vensous insuff;   hx ablation left GSV  2013 in [place];   left small SV laser ablation @[facility]  06-09-2017   Wears glasses     SURGICAL HISTORY: Past Surgical History:  Procedure Laterality Date   BLADDER SURGERY  12/09/2016   per pt sling   BREAST BIOPSY  06/19/2011   Procedure: BREAST BIOPSY;  Surgeon: Emelia Loron, MD;  Location: [facility];  Service: General;  Laterality: Right;  Right breast chronic abscess drainage and debridement   BREAST DUCTAL SYSTEM EXCISION Left 01/24/2014   Procedure:  LEFT BREAST DUCT EXCISION;  Surgeon: Emelia Loron, MD;  Location: [facility];  Service: General;  Laterality: Left;   CESAREAN SECTION Bilateral 03/15/2003   x3  first two in [place];  last one @[facility]  ;   WITH BILATERAL TUBAL LIGATION   COLONOSCOPY  08/15/2021   dr Gwendalyn Ege   DILATATION & CURETTAGE/HYSTEROSCOPY WITH MYOSURE N/A 05/10/2014   Procedure: DILATATION & CURETTAGE/HYSTEROSCOPY WITH MYOSURE ABLATION,RESECTOSCOPIC POLYPECTOMY;  Surgeon: Ok Edwards, MD;  Location: [facility];  Service: Gynecology;  Laterality: N/A;  Confirmed with Myosure rep to be present.   DILITATION & CURRETTAGE/HYSTROSCOPY WITH HYDROTHERMAL ABLATION N/A 06/16/2022   Procedure: DILATATION & CURETTAGE/HYSTEROSCOPY WITH HYDROTHERMAL ABLATION;  Surgeon: Catalina Antigua, MD;  Location: [facility];  Service: Gynecology;  Laterality: N/A;   INCISE AND DRAIN ABCESS Right 03/05/2011   @[facility]   by dr Dwain Sarna;   I&D right breast abscess and  incisional bx   IR EMBO VENOUS NOT HEMORR HEMANG  INC GUIDE ROADMAPPING  06/09/2017   IR RADIOLOGIST EVAL & MGMT  05/04/2017   IR RADIOLOGIST EVAL & MGMT  06/29/2017   VARICOSE VEIN SURGERY Left 2013   in [place];  left leg    SOCIAL HISTORY: Social History   Socioeconomic History   Marital status: Married    Spouse name: Not on file   Number of children: 3   Years of education: Not on file   Highest education level: 6th grade  Occupational History   Occupation: [employer]  7pm-4 am  Tobacco Use    Smoking status: Never   Smokeless tobacco: Never  Vaping Use   Vaping Use: Never used  Substance and Sexual Activity   Alcohol use: Yes    Comment: occ   Drug use: Never   Sexual activity: Yes    Partners: Male    Birth control/protection: Surgical    Comment: 1st intercourse- 18, partners- 2, married- 15 yrs   Other Topics Concern   Not on file  Social History Narrative   Original from [place]   [place]: pt, husband, son and G-son       Social Determinants of Health   Financial Resource Strain: Medium Risk (12/22/2022)   Overall Financial Resource Strain (CARDIA)    Difficulty of Paying Living Expenses: Somewhat hard  Food Insecurity: Patient Declined (12/22/2022)   Hunger Vital Sign    Worried About Running Out of Food in the Last Year: Patient declined    Ran Out of Food in the Last Year: Patient declined  Transportation Needs: No Transportation Needs (12/22/2022)   PRAPARE - Administrator, Civil Service (Medical): No    Lack of Transportation (Non-Medical): No  Physical Activity: Unknown (12/22/2022)   Exercise Vital Sign    Days of Exercise per Week: 0 days    Minutes of Exercise per Session: Not on file  Stress: Stress Concern Present (12/22/2022)   [employer] of Occupational Health - Occupational Stress Questionnaire    Feeling of Stress : To some extent  Social Connections: Socially Isolated (12/22/2022)   Social Connection and Isolation Panel [NHANES]    Frequency of Communication with Friends and Family: Twice a week    Frequency of Social Gatherings with Friends and Family: Never    Attends Religious Services: Never    Diplomatic Services operational officer: No    Attends Engineer, structural: Not on file    Marital Status: Married  Catering manager Violence: Not on file    FAMILY HISTORY: Family History  Problem Relation Age of Onset   Heart disease Mother    Diabetes Other        gm   High Cholesterol Brother        high  TG   Colon cancer Neg Hx    Breast cancer Neg Hx    Esophageal cancer Neg Hx    Stomach cancer Neg Hx    Rectal cancer Neg Hx     ALLERGIES:  has No Known Allergies.  MEDICATIONS:  No current outpatient medications on file.   No current facility-administered medications for this visit.    REVIEW OF SYSTEMS:   Constitutional: Denies fevers, chills or abnormal night sweats All other systems were reviewed with the patient and are negative.  PHYSICAL EXAMINATION: ECOG PERFORMANCE STATUS: 1 - Symptomatic but completely ambulatory  Vitals:   01/19/23 1257  BP: (!) 126/59  Pulse: 71  Resp: 18  Temp: (!) 97.5 F (36.4 C)  SpO2: 98%   Filed Weights   01/19/23 1257  Weight: 180 lb 6.4 oz (81.8 kg)    GENERAL:alert, no distress and comfortable Extremities: Left leg swelling  LABORATORY DATA:  I have reviewed the data as listed Lab Results  Component Value Date   WBC 7.7 06/19/2022   HGB 13.7 06/19/2022   HCT 40.4 06/19/2022   MCV 91.9 06/19/2022   PLT 266.0 06/19/2022   Lab Results  Component Value Date   NA 140 07/21/2022   K 4.6 07/21/2022   CL 106 07/21/2022   CO2 27 07/21/2022    RADIOGRAPHIC STUDIES: I have personally reviewed the radiological reports and agreed with the findings in the report.  ASSESSMENT AND PLAN:  Personal history of DVT (deep vein thrombosis) 06/16/2017: Thrombus left saphenopopliteal junction and left popliteal vein Anticoagulation during her second pregnancy (unknown reason) Patient needs a hysterectomy and therefore she has been referred to [place] for evaluation for hypercoagulability state.  Treatment plan: Repeat ultrasound of the left leg Lab testing for hypercoagulability Follow-up with me in 2 weeks to discuss results and then determine if she can proceed with surgery.  If there is no evidence of DVT then I would refer her to physical therapy for lymphedema management. All questions were answered. The patient knows to call the  clinic with any problems, questions or concerns.    Tamsen Meek, MD 01/19/23

## 2023-01-20 ENCOUNTER — Ambulatory Visit (HOSPITAL_COMMUNITY)
Admission: RE | Admit: 2023-01-20 | Discharge: 2023-01-20 | Disposition: A | Discharge status: Home / Self Care | Payer: Self-pay | Source: Ambulatory Visit | Attending: Hematology and Oncology | Admitting: Hematology and Oncology

## 2023-01-20 DIAGNOSIS — Z86718 Personal history of other venous thrombosis and embolism: Secondary | ICD-10-CM | POA: Insufficient documentation

## 2023-01-20 LAB — LUPUS ANTICOAGULANT PANEL
DRVVT: 37.6 s (ref 0.0–47.0)
PTT Lupus Anticoagulant: 34.3 s (ref 0.0–43.5)

## 2023-01-20 LAB — PROTEIN S, TOTAL: Protein S Ag, Total: 141 % (ref 60–150)

## 2023-01-20 LAB — FACTOR 8 ASSAY: Coagulation Factor VIII: 236 % — ABNORMAL HIGH (ref 56–140)

## 2023-01-20 LAB — ANTITHROMBIN III ANTIGEN: AT III AG PPP IMM-ACNC: 91 % (ref 72–124)

## 2023-01-21 LAB — PROTEIN C, TOTAL: Protein C, Total: 97 % (ref 60–150)

## 2023-01-25 LAB — PROTHROMBIN GENE MUTATION

## 2023-01-26 ENCOUNTER — Other Ambulatory Visit (HOSPITAL_BASED_OUTPATIENT_CLINIC_OR_DEPARTMENT_OTHER): Payer: Self-pay

## 2023-01-26 ENCOUNTER — Ambulatory Visit (INDEPENDENT_AMBULATORY_CARE_PROVIDER_SITE_OTHER): Payer: Self-pay | Admitting: Medical

## 2023-01-26 VITALS — BP 120/60 | HR 58 | Temp 98.0°F | Resp 18 | Ht 62.0 in | Wt 182.0 lb

## 2023-01-26 DIAGNOSIS — L719 Rosacea, unspecified: Secondary | ICD-10-CM

## 2023-01-26 DIAGNOSIS — E669 Obesity, unspecified: Secondary | ICD-10-CM

## 2023-01-26 DIAGNOSIS — L905 Scar conditions and fibrosis of skin: Secondary | ICD-10-CM

## 2023-01-26 MED ORDER — METRONIDAZOLE 1 % EX GEL: Freq: Every day | CUTANEOUS | 0 refills | Status: DC

## 2023-01-26 MED ORDER — METRONIDAZOLE 1 % EX GEL
Freq: Every day | CUTANEOUS | 0 refills | Status: DC
  Filled 2023-01-26: qty 60, 30d supply, fill #0

## 2023-01-26 MED ORDER — BUPROPION HCL ER (XL) 150 MG PO TB24
150.0000 mg | ORAL_TABLET | Freq: Every day | ORAL | 1 refills | Status: DC
Start: 2023-01-26 — End: 2023-06-16
  Filled 2023-01-26 (×2): qty 30, 30d supply, fill #0
  Filled 2023-03-04: qty 30, 30d supply, fill #1

## 2023-01-26 NOTE — Patient Instructions (Addendum)
1. Obesity (BMI 30-39.9) Recommend diet, exercise and below med. Rx advisment. Explained sometimes can help pt lose weight.  - buPROPion (WELLBUTRIN XL) 150 MG 24 hr tablet; Take 1 tablet (150 mg total) by mouth daily.  Dispense: 30 tablet; Refill: 1  2. Scar of skin of upper lip Took antibiotic as I rx'd. Healed secondary intention.  - Ambulatory referral to Plastic Surgery  3. Rosacea Metrogel rx.  -well see how you do and if not responding then refer to dermatologist  Follow up in 4-6 weeks or sooner if needed.

## 2023-01-26 NOTE — Progress Notes (Unsigned)
[name] [address] [place] [address] Phone: [phone] Subjective:   Taylor Parker, [name], [name]  CC: Knee pain follow-up  [id number]  12/11/2022 Concerned that leg pain is more lumbar radiculopathy.  Home exercises given, gabapentin given.  X-rays pending.  Follow-up again in 6 to 8 weeks     Patient does have a patellofemoral arthritis.  We discussed the potential of a Tru lite pull brace, patient is self-pay so we will look at getting this over-the-counter.  Discussed with patient about exercises for the VMO.  Concerned though that some of the radicular symptoms patient is having is actually more of a lumbar pathology.  Will get x-rays to further evaluate for any lumbar pain that is contributing and will start patient on gabapentin as well.  Patient will follow-up with me again in 6 to 8 weeks and bring interpreter as well.     Update 01/27/2023 Taylor Parker is a 57 y.o. female coming in with complaint of lumbar spine, R knee and R hip pain. Patient states injection in knee. Pain went away for about a month. Painful recently. A little pain in hip on the right side.  Xray lumbar spine 12/11/2022 IMPRESSION: 1. Deformities of the left superior SI joint, left iliac wing, and left superior pubic ramus have a remote appearance. 2. Minimal degenerative disc disease in the lumbar spine.  Xray R knee 12/11/2022 IMPRESSION: Mild tricompartmental degenerative changes.  Xray R hip 12/11/2022 IMPRESSION: Deformity of the left superior pubic ramus in the medial left iliac wing and adjacent SI joint are consistent with a healed remote trauma.     Past Medical History:  Diagnosis Date   Anxiety    Bilateral low back pain with bilateral sciatica    Bursitis of hip    Headache(784.0)    otc med prn   History of abnormal  cervical Pap smear    History of DVT of lower extremity 10/2002   4 months preg. , LLE nonocclusive popliteal vein,   treated with coumadin,   History of kidney stones    Hyperlipidemia    Melasma 12/22/2022   OA (osteoarthritis)    hands,  knees   PMB (postmenopausal bleeding)    PMDD (premenstrual dysphoric disorder)    PONV (postoperative nausea and vomiting)    Pre-diabetes    Primary osteoarthritis of right knee 12/28/2019   Rosacea 12/22/2022   Varicose vein of leg    w/  vensous insuff;   hx ablation left GSV 2013 in [place];   left small SV laser ablation @[facility]  06-09-2017   Wears glasses    Past Surgical History:  Procedure Laterality Date   BLADDER SURGERY  12/09/2016   per pt sling   BREAST BIOPSY  06/19/2011   Procedure: BREAST BIOPSY;  Surgeon: Emelia Loron, MD;  Location: [facility];  Service: General;  Laterality: Right;  Right breast chronic abscess drainage and debridement   BREAST DUCTAL SYSTEM EXCISION Left 01/24/2014   Procedure:  LEFT BREAST DUCT EXCISION;  Surgeon: Emelia Loron, MD;  Location: [facility];  Service: General;  Laterality: Left;   CESAREAN SECTION Bilateral 03/15/2003   x3  first two in [place];  last one @[facility]  ;   WITH BILATERAL TUBAL LIGATION   COLONOSCOPY  08/15/2021   dr Gwendalyn Ege   DILATATION & CURETTAGE/HYSTEROSCOPY WITH  MYOSURE N/A 05/10/2014   Procedure: DILATATION & CURETTAGE/HYSTEROSCOPY WITH MYOSURE ABLATION,RESECTOSCOPIC POLYPECTOMY;  Surgeon: Ok Edwards, MD;  Location: [facility];  Service: Gynecology;  Laterality: N/A;  Confirmed with [employer] rep to be present.   DILITATION & CURRETTAGE/HYSTROSCOPY WITH HYDROTHERMAL ABLATION N/A 06/16/2022   Procedure: DILATATION & CURETTAGE/HYSTEROSCOPY WITH HYDROTHERMAL ABLATION;  Surgeon: Catalina Antigua, MD;  Location: [facility];  Service: Gynecology;  Laterality: N/A;   INCISE AND DRAIN ABCESS Right 03/05/2011   @[facility]   by dr Dwain Sarna;   I&D right breast abscess and  incisional bx   IR EMBO VENOUS NOT HEMORR HEMANG  INC GUIDE ROADMAPPING  06/09/2017   IR RADIOLOGIST EVAL & MGMT  05/04/2017   IR RADIOLOGIST EVAL & MGMT  06/29/2017   VARICOSE VEIN SURGERY Left 2013   in [place];  left leg   Social History   Socioeconomic History   Marital status: Married    Spouse name: Not on file   Number of children: 3   Years of education: Not on file   Highest education level: 6th grade  Occupational History   Occupation: [employer]  7pm-4 am  Tobacco Use   Smoking status: Never   Smokeless tobacco: Never  Vaping Use   Vaping status: Never Used  Substance and Sexual Activity   Alcohol use: Yes    Comment: occ   Drug use: Never   Sexual activity: Yes    Partners: Male    Birth control/protection: Surgical    Comment: 1st intercourse- 18, partners- 2, married- 15 yrs   Other Topics Concern   Not on file  Social History Narrative   Original from [place]   [place]hold: pt, husband, son and G-son       Social Determinants of Health   Financial Resource Strain: Medium Risk (12/22/2022)   Overall Financial Resource Strain (CARDIA)    Difficulty of Paying Living Expenses: Somewhat hard  Food Insecurity: Patient Declined (12/22/2022)   Hunger Vital Sign    Worried About Running Out of Food in the Last Year: Patient declined    Ran Out of Food in the Last Year: Patient declined  Transportation Needs: No Transportation Needs (12/22/2022)   PRAPARE - Administrator, Civil Service (Medical): No    Lack of Transportation (Non-Medical): No  Physical Activity: Unknown (12/22/2022)   Exercise Vital Sign    Days of Exercise per Week: 0 days    Minutes of Exercise per Session: Not on file  Stress: Stress Concern Present (12/22/2022)   [employer] of Occupational Health - Occupational Stress Questionnaire    Feeling of Stress : To some extent  Social Connections: Socially Isolated (12/22/2022)   Social Connection and Isolation Panel [NHANES]     Frequency of Communication with Friends and Family: Twice a week    Frequency of Social Gatherings with Friends and Family: Never    Attends Religious Services: Never    Diplomatic Services operational officer: No    Attends Engineer, structural: Not on file    Marital Status: Married   No Known Allergies Family History  Problem Relation Age of Onset   Heart disease Mother    Diabetes Other        gm   High Cholesterol Brother        high TG   Colon cancer Neg Hx    Breast cancer Neg Hx    Esophageal cancer Neg Hx    Stomach cancer Neg Hx  Rectal cancer Neg Hx          Current Outpatient Medications (Other):    buPROPion (WELLBUTRIN XL) 150 MG 24 hr tablet, Take 1 tablet (150 mg total) by mouth daily.   metroNIDAZOLE (METROGEL) 1 % gel, Apply topically daily.   Reviewed prior external information including notes and imaging from  primary care provider As well as notes that were available from care everywhere and other healthcare systems.  Past medical history, social, surgical and family history all reviewed in electronic medical record.  No pertanent information unless stated regarding to the chief complaint.   Review of Systems:  No headache, visual changes, nausea, vomiting, diarrhea, constipation, dizziness, abdominal pain, skin rash, fevers, chills, night sweats, weight loss, swollen lymph nodes, body aches, joint swelling, chest pain, shortness of breath, mood changes. POSITIVE muscle aches  Objective  Blood pressure 102/62, pulse 61, height 5\' 2"  (1.575 m), weight 184 lb (83.5 kg), last menstrual period 08/31/2020, SpO2 98%.   General: No apparent distress alert and oriented x3 mood and affect normal, dressed appropriately.  HEENT: Pupils equal, extraocular movements intact  Respiratory: Patient's speak in full sentences and does not appear short of breath  Cardiovascular: No lower extremity edema, non tender, no erythema   Low back exam shows  continuing to have an antalgic walk noted.  Some degenerative changes noted with some mild degenerative scoliosis.  Tightness with Pearlean Brownie right greater than left  Right knee exam shows patient does still have the lateral tracking of the patella noted.  Does have crepitus noted.  Tender to palpation over the medial joint line.  After informed written and verbal consent, patient was seated on exam table. Right knee was prepped with alcohol swab and utilizing anterolateral approach, patient's right knee space was injected with 2 cc 0.5% Marcaine then injected with 5 cc of PRP leukocyte poor.. Patient tolerated the procedure well without immediate complications.      Impression and Recommendations:      The above documentation has been reviewed and is accurate and complete Judi Saa, DO

## 2023-01-26 NOTE — Addendum Note (Signed)
Addended by: Maximino Sarin on: 01/26/2023 10:37 AM   Modules accepted: Orders

## 2023-01-26 NOTE — Progress Notes (Signed)
   Subjective:    Patient ID: Taylor Parker, female    DOB: 1965-10-10, 57 y.o.   MRN: [id number]  HPI Pt in for follow up.  Pt has hx of mild elevated sugar and obesity. I had rx'd metformin in past and she states caused excess stomach bloating. For that reason she stopped.   She still wants to loose wt. No hx of seizures. No hx of smoking.   Pt states she feel lump in her mid upper lip where she was bitten by dog. I saw pt and rx'd augmentin.  Below is last visit. A/P  "Augmentin rx to prevent infection. Personal pet that has vaccines and indoor dog. Too late to put sutures and self treated. Presently no infection. Will follow wound and hopefully won't have too much of scar. If excessive scar could refer to plastic."  Hx of rosacea(dx years ago). She states her cheeks get red when exposed to sun.    Review of Systems See hpi.    Objective:   Physical Exam  General- No acute distress. Pleasant patient. Neck- Full range of motion, no jvd Lungs- Clear, even and unlabored. Heart- regular rate and rhythm. Neurologic- CNII- XII grossly intact.  Skin- upper lipid midline scar with bump in area where dog bit lip. - cheeks appears red. Pt states not make up.  -small scattered skin tag.      Assessment & Plan:   Patient Instructions  1. Obesity (BMI 30-39.9) Recommend diet, exercise and below med. Rx advisment. Explained sometimes can help pt lose weight.  - buPROPion (WELLBUTRIN XL) 150 MG 24 hr tablet; Take 1 tablet (150 mg total) by mouth daily.  Dispense: 30 tablet; Refill: 1  2. Scar of skin of upper lip Took antibiotic as I rx'd. Healed secondary intention.  - Ambulatory referral to Plastic Surgery  3. Rosacea Metrogel rx.  -well see how you do and if not responding then refer to dermatologist  Follow up in 4-6 weeks or sooner if needed.    Esperanza Richters, PA-C

## 2023-01-27 ENCOUNTER — Other Ambulatory Visit: Payer: Self-pay

## 2023-01-27 ENCOUNTER — Encounter: Payer: Self-pay | Admitting: Family Medicine

## 2023-01-27 ENCOUNTER — Ambulatory Visit (INDEPENDENT_AMBULATORY_CARE_PROVIDER_SITE_OTHER): Payer: Self-pay | Admitting: Family Medicine

## 2023-01-27 VITALS — BP 102/62 | HR 61 | Ht 62.0 in | Wt 184.0 lb

## 2023-01-27 DIAGNOSIS — G8929 Other chronic pain: Secondary | ICD-10-CM

## 2023-01-27 DIAGNOSIS — M25561 Pain in right knee: Secondary | ICD-10-CM

## 2023-01-27 DIAGNOSIS — M1711 Unilateral primary osteoarthritis, right knee: Secondary | ICD-10-CM

## 2023-01-27 MED ORDER — GABAPENTIN 100 MG PO CAPS: 200.0000 mg | ORAL_CAPSULE | Freq: Every day | ORAL | 0 refills | Status: DC

## 2023-01-27 NOTE — Addendum Note (Signed)
Addended by: Nadine Counts R on: 01/27/2023 10:50 AM   Modules accepted: Orders

## 2023-01-27 NOTE — Patient Instructions (Signed)
No ice or IBU for 3 days Heat and tylenol are ok See me again in 6 weeks

## 2023-01-27 NOTE — Assessment & Plan Note (Signed)
Known arthritic changes noted.  We discussed with patient with the aid of a.  At this point I would like to see if we can get some results with the PRP injection.  Patient was given this today.  Post PRP handout and exercise prescription given.  Increase activity slowly otherwise.  Follow-up with me again 2 months to see how patient is responding.

## 2023-02-01 LAB — FACTOR 5 LEIDEN

## 2023-02-02 ENCOUNTER — Other Ambulatory Visit (HOSPITAL_COMMUNITY): Payer: Self-pay

## 2023-02-02 ENCOUNTER — Encounter: Payer: Self-pay | Admitting: Plastic Surgery

## 2023-02-02 ENCOUNTER — Ambulatory Visit (INDEPENDENT_AMBULATORY_CARE_PROVIDER_SITE_OTHER): Payer: Self-pay | Admitting: Plastic Surgery

## 2023-02-02 VITALS — BP 104/68 | HR 62 | Ht 62.0 in | Wt 184.2 lb

## 2023-02-02 DIAGNOSIS — L905 Scar conditions and fibrosis of skin: Secondary | ICD-10-CM

## 2023-02-02 NOTE — Progress Notes (Signed)
Referring Provider Esperanza Richters, PA-C [id number],  [place] [address]   CC:  Chief Complaint  Patient presents with   Consult      Taylor Parker is an 57 y.o. female.  HPI: [name] is a 57 year old female who was bitten on her lip by her dog on June 8.  The patient did not seek care until 3 days after the injury.  At that time she was put on antibiotics and had been using second skin on her lip.  No sutures were ever placed.  Today she is concerned about the fullness within her lip.  She is worried that this represents an infection.  No Known Allergies  Outpatient Encounter Medications as of 02/02/2023  Medication Sig   buPROPion (WELLBUTRIN XL) 150 MG 24 hr tablet Take 1 tablet (150 mg total) by mouth daily.   gabapentin (NEURONTIN) 100 MG capsule Take 2 capsules (200 mg total) by mouth at bedtime.   metroNIDAZOLE (METROGEL) 1 % gel Apply topically daily.   No facility-administered encounter medications on file as of 02/02/2023.     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 , LLE nonocclusive popliteal vein,   treated with coumadin,   History of kidney stones    Hyperlipidemia    Melasma 12/22/2022   OA (osteoarthritis)    hands,  knees   PMB (postmenopausal bleeding)    PMDD (premenstrual dysphoric disorder)    PONV (postoperative nausea and vomiting)    Pre-diabetes    Primary osteoarthritis of right knee 12/28/2019   Rosacea 12/22/2022   Varicose vein of leg    w/  vensous insuff;   hx ablation left GSV 2013 in [place];   left small SV laser ablation @[facility]  06-09-2017   Wears glasses     Past Surgical History:  Procedure Laterality Date   BLADDER SURGERY  12/09/2016   per pt sling   BREAST BIOPSY   06/19/2011   Procedure: BREAST BIOPSY;  Surgeon: Emelia Loron, MD;  Location: [facility];  Service: General;  Laterality: Right;  Right breast chronic abscess drainage and debridement   BREAST DUCTAL SYSTEM EXCISION Left 01/24/2014   Procedure:  LEFT BREAST DUCT EXCISION;  Surgeon: Emelia Loron, MD;  Location: [facility];  Service: General;  Laterality: Left;   CESAREAN SECTION Bilateral 03/15/2003   x3  first two in [place];  last one @[facility]  ;   WITH BILATERAL TUBAL LIGATION   COLONOSCOPY  08/15/2021   dr Gwendalyn Ege   DILATATION & CURETTAGE/HYSTEROSCOPY WITH MYOSURE N/A 05/10/2014   Procedure: DILATATION & CURETTAGE/HYSTEROSCOPY WITH MYOSURE ABLATION,RESECTOSCOPIC POLYPECTOMY;  Surgeon: Ok Edwards, MD;  Location: [facility];  Service: Gynecology;  Laterality: N/A;  Confirmed with [employer] rep to be present.   DILITATION & CURRETTAGE/HYSTROSCOPY WITH HYDROTHERMAL ABLATION N/A 06/16/2022   Procedure: DILATATION & CURETTAGE/HYSTEROSCOPY WITH HYDROTHERMAL ABLATION;  Surgeon: Catalina Antigua, MD;  Location: [facility];  Service: Gynecology;  Laterality: N/A;   INCISE AND DRAIN ABCESS Right 03/05/2011   @[facility]   by dr Dwain Sarna;   I&D right breast abscess and incisional bx   IR EMBO VENOUS NOT HEMORR HEMANG  INC GUIDE ROADMAPPING  06/09/2017   IR RADIOLOGIST EVAL & MGMT  05/04/2017   IR RADIOLOGIST EVAL & MGMT  06/29/2017   VARICOSE VEIN SURGERY Left 2013   in [place];  left leg    Family History  Problem Relation Age of Onset   Heart disease Mother    Diabetes Other        gm   High Cholesterol Brother        high TG   Colon cancer Neg Hx    Breast cancer Neg Hx    Esophageal cancer Neg Hx    Stomach cancer Neg Hx    Rectal cancer Neg Hx     Social History   Social History Narrative   Original from [place]   [place]: pt, husband, son and G-son         Review of Systems General: Denies fevers, chills, weight loss CV: Denies chest pain, shortness of  breath, palpitations Upper lip: Well-healed scars from a recent trauma and the patient reports some numbness in the lip  Physical Exam    02/02/2023   11:01 AM 01/27/2023    9:28 AM 01/26/2023    9:23 AM  Vitals with BMI  Height 5\' 2"  5\' 2"  5\' 2"   Weight 184 lbs 3 oz 184 lbs 182 lbs  BMI 33.68 33.65 33.28  Systolic 104 102 657  Diastolic 68 62 60  Pulse 62 61 58    General:  No acute distress,  Alert and oriented, Non-Toxic, Normal speech and affect Upper lip: Patient has well-healed scars on her upper lip especially on the right side of the cupids bow.  On palpation there is scar tissue underneath the central portion of the lip and in the lateral portion of the lip on the right side. Mammogram: Not applicable Assessment/Plan Scars, lip, status post trauma: Discussed with the patient that massage will likely resolve the fullness in the lip which I believe is scar tissue and not an infection.  She was reassured that an infection was unlikely given the appearance of her lip.  We did discuss the use of sunscreen and sun avoidance to help improve the appearance of the scar.  She will return to see me mid December or early January for reevaluation at which time we will determine if she needs any surgical intervention to improve the appearance of her lip.  Santiago Glad 02/02/2023, 12:29 PM

## 2023-02-03 NOTE — Progress Notes (Signed)
   Patient Care Team: Marisue Brooklyn as PCP - General (Internal Medicine) Ok Edwards, MD (Inactive) (Obstetrics and Gynecology) Priscille Heidelberg, RN  DIAGNOSIS: No diagnosis found.  SUMMARY OF ONCOLOGIC HISTORY: Oncology History   No history exists.    CHIEF COMPLIANT:   INTERVAL HISTORY: Taylor Parker is a   ALLERGIES:  has No Known Allergies.  MEDICATIONS:  Current Outpatient Medications  Medication Sig Dispense Refill   buPROPion (WELLBUTRIN XL) 150 MG 24 hr tablet Take 1 tablet (150 mg total) by mouth daily. 30 tablet 1   gabapentin (NEURONTIN) 100 MG capsule Take 2 capsules (200 mg total) by mouth at bedtime. 180 capsule 0   metroNIDAZOLE (METROGEL) 1 % gel Apply topically daily. 60 g 0   No current facility-administered medications for this visit.    PHYSICAL EXAMINATION: ECOG PERFORMANCE STATUS: {CHL ONC ECOG PS:[phone]}  There were no vitals filed for this visit. There were no vitals filed for this visit.  BREAST:*** No palpable masses or nodules in either right or left breasts. No palpable axillary supraclavicular or infraclavicular adenopathy no breast tenderness or nipple discharge. (exam performed in the presence of a chaperone)  LABORATORY DATA:  I have reviewed the data as listed    Latest Ref Rng & Units 07/21/2022   10:24 AM 06/19/2022    1:36 PM 05/06/2022    4:16 PM  CMP  Glucose 70 - 99 mg/dL 92  83  86   BUN 6 - 23 mg/dL 10  17  8    Creatinine 0.40 - 1.20 mg/dL 4.09  8.11  9.14   Sodium 135 - 145 mEq/L 140  140  138   Potassium 3.5 - 5.1 mEq/L 4.6  4.6  3.8   Chloride 96 - 112 mEq/L 106  102  111   CO2 19 - 32 mEq/L 27  34  19   Calcium 8.4 - 10.5 mg/dL 8.9  9.3  8.5   Total Protein 6.0 - 8.3 g/dL 6.7  6.7  6.8   Total Bilirubin 0.2 - 1.2 mg/dL 0.3  0.3  0.5   Alkaline Phos 39 - 117 U/L 75  67  48   AST 0 - 37 U/L 14  15  21    ALT 0 - 35 U/L 13  12  14      Lab Results  Component Value Date   WBC 7.7  06/19/2022   HGB 13.7 06/19/2022   HCT 40.4 06/19/2022   MCV 91.9 06/19/2022   PLT 266.0 06/19/2022   NEUTROABS 5.0 06/19/2022    ASSESSMENT & PLAN:  No problem-specific Assessment & Plan notes found for this encounter.    No orders of the defined types were placed in this encounter.  The patient has a good understanding of the overall plan. she agrees with it. she will call with any problems that may develop before the next visit here. Total time spent: 30 mins including face to face time and time spent for planning, charting and co-ordination of care   Sherlyn Lick, CMA 02/03/23    I Janan Ridge am acting as a Neurosurgeon for [employer]  ***

## 2023-02-04 ENCOUNTER — Inpatient Hospital Stay (HOSPITAL_BASED_OUTPATIENT_CLINIC_OR_DEPARTMENT_OTHER): Payer: Self-pay | Admitting: Hematology and Oncology

## 2023-02-04 VITALS — BP 112/42 | HR 72 | Temp 97.7°F | Resp 18 | Ht 62.0 in | Wt 183.3 lb

## 2023-02-04 DIAGNOSIS — Z86718 Personal history of other venous thrombosis and embolism: Secondary | ICD-10-CM

## 2023-02-04 NOTE — Assessment & Plan Note (Signed)
06/16/2017: Thrombus left saphenopopliteal junction and left popliteal vein Anticoagulation during her second pregnancy (unknown reason) Patient needs a hysterectomy and therefore she has been referred to [place] for evaluation for hypercoagulability state.   Treatment plan: 01/20/2023: Ultrasound extremities: No evidence of DVT Hypercoagulability W/U: Factor 8: 236%, Prot C, S, AT3, Factor 5 Leiden Neg, PT gene mut: Neg, Lupus Anticoag: Neg  Patient can stop anticoagulation at this time

## 2023-02-10 ENCOUNTER — Encounter (INDEPENDENT_AMBULATORY_CARE_PROVIDER_SITE_OTHER): Payer: Self-pay

## 2023-02-23 ENCOUNTER — Telehealth: Payer: Self-pay

## 2023-02-23 NOTE — Telephone Encounter (Signed)
Reached out to patient to notify her that Dr. Odie Sera has an opening on 04/13/23. Left a message asking patient to call me to confirm her availability.

## 2023-03-02 ENCOUNTER — Ambulatory Visit: Payer: Self-pay | Admitting: Medical

## 2023-03-04 ENCOUNTER — Telehealth: Payer: Self-pay

## 2023-03-04 ENCOUNTER — Other Ambulatory Visit (HOSPITAL_BASED_OUTPATIENT_CLINIC_OR_DEPARTMENT_OTHER): Payer: Self-pay

## 2023-03-04 ENCOUNTER — Ambulatory Visit (INDEPENDENT_AMBULATORY_CARE_PROVIDER_SITE_OTHER): Payer: Self-pay | Admitting: Medical

## 2023-03-04 VITALS — BP 114/66 | HR 56 | Resp 18 | Ht 62.0 in | Wt 183.4 lb

## 2023-03-04 DIAGNOSIS — I83812 Varicose veins of left lower extremities with pain: Secondary | ICD-10-CM

## 2023-03-04 DIAGNOSIS — M2550 Pain in unspecified joint: Secondary | ICD-10-CM

## 2023-03-04 DIAGNOSIS — E669 Obesity, unspecified: Secondary | ICD-10-CM

## 2023-03-04 DIAGNOSIS — M79671 Pain in right foot: Secondary | ICD-10-CM

## 2023-03-04 DIAGNOSIS — M791 Myalgia, unspecified site: Secondary | ICD-10-CM

## 2023-03-04 DIAGNOSIS — R768 Other specified abnormal immunological findings in serum: Secondary | ICD-10-CM

## 2023-03-04 DIAGNOSIS — M79672 Pain in left foot: Secondary | ICD-10-CM

## 2023-03-04 LAB — C-REACTIVE PROTEIN: CRP: <1 mg/dL (ref 0.5–20.0)

## 2023-03-04 LAB — SEDIMENTATION RATE: Sed Rate: 10 mm/hr (ref 0–30)

## 2023-03-04 NOTE — Progress Notes (Signed)
Subjective:    Patient ID: Taylor Parker, female    DOB: 03-27-1966, 57 y.o.   MRN: [id number]  HPI  Pt in for follow up on weight loss. Pt did not loose any weight with wellbutrin recently over last month. She states metformin caused excessive gi side effects so had to stop  She her weight is the exact same since last visit.   [facility] for her varicose veins.   Pt wants to see specialist for heel pain and distal rt foot pain bottom aspect. 3 months of heel pain bilaterally. Worse when she wakes in morning.    She also describes diffuse body aches occurs randomly. Often in upper back and various joints. She wanders if has fibromyalgia. Some daily shoulder, knee and elbow pain often.   Review of Systems  Constitutional:  Negative for chills and fatigue.  Respiratory:  Negative for chest tightness, shortness of breath and wheezing.   Cardiovascular:  Negative for chest pain and palpitations.  Gastrointestinal:  Negative for abdominal pain, blood in stool and nausea.  Musculoskeletal:  Positive for arthralgias and myalgias. Negative for back pain.  Skin:  Negative for rash.  Neurological:  Negative for dizziness, tremors, seizures, facial asymmetry, weakness and light-headedness.  Hematological:  Negative for adenopathy. Does not bruise/bleed easily.  Psychiatric/Behavioral:  Negative for behavioral problems and decreased concentration.     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 , LLE nonocclusive popliteal vein,   treated with coumadin,   History of kidney stones    Hyperlipidemia    Melasma 12/22/2022   OA (osteoarthritis)    hands,  knees   PMB (postmenopausal bleeding)    PMDD (premenstrual  dysphoric disorder)    PONV (postoperative nausea and vomiting)    Pre-diabetes    Primary osteoarthritis of right knee 12/28/2019   Rosacea 12/22/2022   Varicose vein of leg    w/  vensous insuff;   hx ablation left GSV 2013 in [place];   left small SV laser ablation @[facility]  06-09-2017   Wears glasses      Social History   Socioeconomic History   Marital status: Married    Spouse name: Not on file   Number of children: 3   Years of education: Not on file   Highest education level: 6th grade  Occupational History   Occupation: [employer]  7pm-4 am  Tobacco Use   Smoking status: Never   Smokeless tobacco: Never  Vaping Use   Vaping status: Never Used  Substance and Sexual Activity   Alcohol use: Yes    Comment: occ   Drug use: Never   Sexual activity: Yes    Partners: Male    Birth control/protection: Surgical    Comment: 1st intercourse- 18, partners- 2, married- 15 yrs   Other Topics Concern   Not on file  Social History Narrative   Original from [place]   Houshold: pt, husband, son and G-son       Social Determinants of Health   Financial Resource Strain: Medium Risk (12/22/2022)   Overall Financial Resource Strain (CARDIA)    Difficulty of Paying Living Expenses: Somewhat hard  Food Insecurity: Patient Declined (12/22/2022)   Hunger Vital Sign    Worried About Running Out of Food in the Last Year: Patient declined    Ran Out of Food in the Last Year: Patient declined  Transportation Needs: No Transportation Needs (12/22/2022)   PRAPARE - Administrator, Civil Service (Medical): No    Lack of Transportation (Non-Medical): No  Physical Activity: Unknown (12/22/2022)   Exercise Vital Sign    Days of Exercise per Week: 0 days    Minutes of Exercise per Session: Not on file  Stress: Stress Concern Present (12/22/2022)   [employer] of Occupational Health - Occupational Stress Questionnaire    Feeling of Stress : To some extent  Social Connections:  Socially Isolated (12/22/2022)   Social Connection and Isolation Panel [NHANES]    Frequency of Communication with Friends and Family: Twice a week    Frequency of Social Gatherings with Friends and Family: Never    Attends Religious Services: Never    Database administrator or Organizations: No    Attends Engineer, structural: Not on file    Marital Status: Married  Catering manager Violence: Not on file    Past Surgical History:  Procedure Laterality Date   BLADDER SURGERY  12/09/2016   per pt sling   BREAST BIOPSY  06/19/2011   Procedure: BREAST BIOPSY;  Surgeon: Emelia Loron, MD;  Location: [facility];  Service: General;  Laterality: Right;  Right breast chronic abscess drainage and debridement   BREAST DUCTAL SYSTEM EXCISION Left 01/24/2014   Procedure:  LEFT BREAST DUCT EXCISION;  Surgeon: Emelia Loron, MD;  Location: [facility];  Service: General;  Laterality: Left;   CESAREAN SECTION Bilateral 03/15/2003   x3  first two in [place];  last one @[facility]  ;   WITH BILATERAL TUBAL LIGATION   COLONOSCOPY  08/15/2021   dr Gwendalyn Ege   DILATATION & CURETTAGE/HYSTEROSCOPY WITH MYOSURE N/A 05/10/2014   Procedure: DILATATION & CURETTAGE/HYSTEROSCOPY WITH MYOSURE ABLATION,RESECTOSCOPIC POLYPECTOMY;  Surgeon: Ok Edwards, MD;  Location: [facility];  Service: Gynecology;  Laterality: N/A;  Confirmed with [employer] rep to be present.   DILITATION & CURRETTAGE/HYSTROSCOPY WITH HYDROTHERMAL ABLATION N/A 06/16/2022   Procedure: DILATATION & CURETTAGE/HYSTEROSCOPY WITH HYDROTHERMAL ABLATION;  Surgeon: Catalina Antigua, MD;  Location: [facility];  Service: Gynecology;  Laterality: N/A;   INCISE AND DRAIN ABCESS Right 03/05/2011   @[facility]   by dr Dwain Sarna;   I&D right breast abscess and incisional bx   IR EMBO VENOUS NOT HEMORR HEMANG  INC GUIDE ROADMAPPING  06/09/2017   IR RADIOLOGIST EVAL & MGMT  05/04/2017   IR RADIOLOGIST EVAL & MGMT  06/29/2017   VARICOSE VEIN  SURGERY Left 2013   in [place];  left leg    Family History  Problem Relation Age of Onset   Heart disease Mother    Diabetes Other        gm   High Cholesterol Brother        high TG   Colon cancer Neg Hx    Breast cancer Neg Hx    Esophageal cancer Neg Hx    Stomach cancer Neg Hx    Rectal cancer Neg Hx     No Known Allergies  Current Outpatient Medications on File Prior to Visit  Medication Sig Dispense Refill   buPROPion (WELLBUTRIN XL) 150 MG 24 hr tablet Take 1 tablet (150 mg total) by mouth daily. 30 tablet 1   gabapentin (  NEURONTIN) 100 MG capsule Take 2 capsules (200 mg total) by mouth at bedtime. 180 capsule 0   No current facility-administered medications on file prior to visit.    BP 114/66   Pulse (!) 56   Resp 18   Ht 5\' 2"  (1.575 m)   Wt 183 lb 6.4 oz (83.2 kg)   LMP 08/31/2020 (Exact Date)   SpO2 97%   BMI 33.54 kg/m        Objective:   Physical Exam  General Mental Status- Alert. General Appearance- Not in acute distress.   Skin General: Color- Normal Color. Moisture- Normal Moisture.  Neck Carotid Arteries- Normal color. Moisture- Normal Moisture. No carotid bruits. No JVD.  Chest and Lung Exam Auscultation: Breath Sounds:-Normal.  Cardiovascular Auscultation:Rythm- Regular. Murmurs & Other Heart Sounds:Auscultation of the heart reveals- No Murmurs.  Abdomen Inspection:-Inspeection Normal. Palpation/Percussion:Note:No mass. Palpation and Percussion of the abdomen reveal- Non Tender, Non Distended + BS, no rebound or guarding.    Neurologic Cranial Nerve exam:- CN III-XII intact(No nystagmus), symmetric smile. Strength:- 5/5 equal and symmetric strength both upper and lower extremities.       Assessment & Plan:   Patient Instructions  1. Obesity (BMI 30-39.9) -no weight loss with wellbutrin but since you feel better mood wise with med will continue. -Recommend try [employer] app.  2. Varicose veins of left lower extremity with  pain [facility]. But recommend not to cancel just yet with [facility] as we don't know when can get in with [facility]. - Ambulatory referral to Vascular Surgery  3. Myalgia Will evaluate labs before we would consider fibromyalgias as you have asked about. - Sedimentation rate - C-reactive protein - ANA - Rheumatoid factor  4. Arthralgia, unspecified joint - evaluate labs below then decide on possible med. - Sedimentation rate - C-reactive protein - ANA - Rheumatoid factor  5. Foot pain, bilateral  - Ambulatory referral to Podiatry  6. Heel pain, bilateral Referral to foot specialist place. - Ambulatory referral to Podiatry  Follow up date to be determined after lab review

## 2023-03-04 NOTE — Patient Instructions (Addendum)
1. Obesity (BMI 30-39.9) -no weight loss with wellbutrin but since you feel better mood wise with med will continue. -Recommend try [employer] app.  2. Varicose veins of left lower extremity with pain [facility]. But recommend not to cancel just yet with [facility] as we don't know when can get in with [facility]. - Ambulatory referral to Vascular Surgery  3. Myalgia Will evaluate labs before we would consider fibromyalgias as you have asked about. - Sedimentation rate - C-reactive protein - ANA - Rheumatoid factor  4. Arthralgia, unspecified joint - evaluate labs below then decide on possible med. - Sedimentation rate - C-reactive protein - ANA - Rheumatoid factor  5. Foot pain, bilateral  - Ambulatory referral to Podiatry  6. Heel pain, bilateral Referral to foot specialist place. - Ambulatory referral to Podiatry    Follow up date to be determined after lab review

## 2023-03-04 NOTE — Telephone Encounter (Signed)
Patient was returning my callback request, but stated she does not understand English well. Advised, I would request an interpreter and call her back.

## 2023-03-07 LAB — RHEUMATOID FACTOR: Rheumatoid fact SerPl-aCnc: 10 [IU]/mL (ref <14)

## 2023-03-07 LAB — ANA: Anti Nuclear Antibody (ANA): POSITIVE — AB

## 2023-03-07 LAB — ANTI-NUCLEAR AB-TITER (ANA TITER): ANA Titer 1: 1:1280 {titer} — ABNORMAL HIGH

## 2023-03-07 NOTE — Addendum Note (Signed)
Addended by: Gwenevere Abbot on: 03/07/2023 05:12 PM   Modules accepted: Orders

## 2023-03-09 ENCOUNTER — Other Ambulatory Visit: Payer: Self-pay | Admitting: *Deleted

## 2023-03-09 ENCOUNTER — Ambulatory Visit (HOSPITAL_COMMUNITY)
Admission: RE | Admit: 2023-03-09 | Discharge: 2023-03-09 | Disposition: A | Discharge status: Home / Self Care | Payer: Self-pay | Source: Ambulatory Visit | Attending: Vascular Surgery | Admitting: Vascular Surgery

## 2023-03-09 DIAGNOSIS — I872 Venous insufficiency (chronic) (peripheral): Secondary | ICD-10-CM

## 2023-03-09 DIAGNOSIS — I83819 Varicose veins of unspecified lower extremities with pain: Secondary | ICD-10-CM

## 2023-03-09 NOTE — Progress Notes (Unsigned)
[name] [address] [place] [address] Phone: [phone] Subjective:   [name], [name], Ramon Dredge, PA-C  CC: right knee pain   ONG:[id number]  01/27/2023 Known arthritic changes noted.  We discussed with patient with the aid of a.  At this point I would like to see if we can get some results with the PRP injection.  Patient was given this today.  Post PRP handout and exercise prescription given.  Increase activity slowly otherwise.  Follow-up with me again 2 months to see how patient is responding.     Update 03/10/2023 Charizma Linko is a 57 y.o. female coming in with complaint of R knee pain. Right knee did have PRP 7/17. Patient states that her pain decreased since last visit but not as much as she had hoped. Pain decreased 50%. Pain over medial aspect when she sits down and bends knee. Stairs also increase her pain.   Also notes aching throughout entire body. Went to PCP to check to see if she has fibromyalgia.  Laboratory workup did show a positive ANA and was referred to rheumatology for further evaluation.  Pain in R foot over metatarsal heads since last summer between the 2nd and 3rd toes.   Patient does have an interpreter with her today.    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 , LLE nonocclusive popliteal vein,   treated with coumadin,   History of kidney stones    Hyperlipidemia    Melasma 12/22/2022   OA (osteoarthritis)    hands,  knees   PMB (postmenopausal bleeding)    PMDD (premenstrual dysphoric disorder)    PONV (postoperative nausea and vomiting)    Pre-diabetes    Primary osteoarthritis of  right knee 12/28/2019   Rosacea 12/22/2022   Varicose vein of leg    w/  vensous insuff;   hx ablation left GSV 2013 in [place];   left small SV laser ablation @[facility]  06-09-2017   Wears glasses    Past Surgical History:  Procedure Laterality Date   BLADDER SURGERY  12/09/2016   per pt sling   BREAST BIOPSY  06/19/2011   Procedure: BREAST BIOPSY;  Surgeon: Emelia Loron, MD;  Location: [facility];  Service: General;  Laterality: Right;  Right breast chronic abscess drainage and debridement   BREAST DUCTAL SYSTEM EXCISION Left 01/24/2014   Procedure:  LEFT BREAST DUCT EXCISION;  Surgeon: Emelia Loron, MD;  Location: [facility];  Service: General;  Laterality: Left;   CESAREAN SECTION Bilateral 03/15/2003   x3  first two in [place];  last one @[facility]  ;   WITH BILATERAL TUBAL LIGATION   COLONOSCOPY  08/15/2021   dr Gwendalyn Ege   DILATATION & CURETTAGE/HYSTEROSCOPY WITH MYOSURE N/A 05/10/2014   Procedure: DILATATION & CURETTAGE/HYSTEROSCOPY WITH MYOSURE ABLATION,RESECTOSCOPIC POLYPECTOMY;  Surgeon: Ok Edwards, MD;  Location: [facility];  Service: Gynecology;  Laterality: N/A;  Confirmed with [employer] rep to be present.   DILITATION & CURRETTAGE/HYSTROSCOPY WITH HYDROTHERMAL ABLATION N/A 06/16/2022   Procedure: DILATATION & CURETTAGE/HYSTEROSCOPY WITH HYDROTHERMAL ABLATION;  Surgeon:  Constant, Peggy, MD;  Location: [facility];  Service: Gynecology;  Laterality: N/A;   INCISE AND DRAIN ABCESS Right 03/05/2011   @[facility]   by dr Dwain Sarna;   I&D right breast abscess and incisional bx   IR EMBO VENOUS NOT HEMORR HEMANG  INC GUIDE ROADMAPPING  06/09/2017   IR RADIOLOGIST EVAL & MGMT  05/04/2017   IR RADIOLOGIST EVAL & MGMT  06/29/2017   VARICOSE VEIN SURGERY Left 2013   in [place];  left leg   Social History   Socioeconomic History   Marital status: Married    Spouse name: Not on file   Number of children: 3   Years of education: Not on file   Highest education level: 6th  grade  Occupational History   Occupation: [employer]  7pm-4 am  Tobacco Use   Smoking status: Never   Smokeless tobacco: Never  Vaping Use   Vaping status: Never Used  Substance and Sexual Activity   Alcohol use: Yes    Comment: occ   Drug use: Never   Sexual activity: Yes    Partners: Male    Birth control/protection: Surgical    Comment: 1st intercourse- 18, partners- 2, married- 15 yrs   Other Topics Concern   Not on file  Social History Narrative   Original from [place]   Houshold: pt, husband, son and G-son       Social Determinants of Health   Financial Resource Strain: Medium Risk (12/22/2022)   Overall Financial Resource Strain (CARDIA)    Difficulty of Paying Living Expenses: Somewhat hard  Food Insecurity: Patient Declined (12/22/2022)   Hunger Vital Sign    Worried About Running Out of Food in the Last Year: Patient declined    Ran Out of Food in the Last Year: Patient declined  Transportation Needs: No Transportation Needs (12/22/2022)   PRAPARE - Administrator, Civil Service (Medical): No    Lack of Transportation (Non-Medical): No  Physical Activity: Unknown (12/22/2022)   Exercise Vital Sign    Days of Exercise per Week: 0 days    Minutes of Exercise per Session: Not on file  Stress: Stress Concern Present (12/22/2022)   [employer] of Occupational Health - Occupational Stress Questionnaire    Feeling of Stress : To some extent  Social Connections: Socially Isolated (12/22/2022)   Social Connection and Isolation Panel [NHANES]    Frequency of Communication with Friends and Family: Twice a week    Frequency of Social Gatherings with Friends and Family: Never    Attends Religious Services: Never    Diplomatic Services operational officer: No    Attends Engineer, structural: Not on file    Marital Status: Married   No Known Allergies Family History  Problem Relation Age of Onset   Heart disease Mother    Diabetes Other        gm    High Cholesterol Brother        high TG   Colon cancer Neg Hx    Breast cancer Neg Hx    Esophageal cancer Neg Hx    Stomach cancer Neg Hx    Rectal cancer Neg Hx          Current Outpatient Medications (Other):    buPROPion (WELLBUTRIN XL) 150 MG 24 hr tablet, Take 1 tablet (150 mg total) by mouth daily.   gabapentin (NEURONTIN) 100 MG capsule, Take 2 capsules (200 mg total) by mouth at bedtime.  Reviewed prior external information including notes and imaging from  primary care provider As well as notes that were available from care everywhere and other healthcare systems.  Past medical history, social, surgical and family history all reviewed in electronic medical record.  No pertanent information unless stated regarding to the chief complaint.   Review of Systems:  No headache, visual changes, nausea, vomiting, diarrhea, constipation, dizziness, abdominal pain, skin rash, fevers, chills, night sweats, weight loss, swollen lymph nodes, body aches, joint swelling, chest pain, shortness of breath, mood changes. POSITIVE muscle aches  Objective  Blood pressure 106/76, pulse 72, height 5\' 2"  (1.575 m), weight 180 lb (81.6 kg), last menstrual period 08/31/2020, SpO2 96%.   General: No apparent distress alert and oriented x3 mood and affect normal, dressed appropriately.  HEENT: Pupils equal, extraocular movements intact  Respiratory: Patient's speak in full sentences and does not appear short of breath  Cardiovascular: No lower extremity edema, non tender, no erythema  Knee exam shows patient does still have some crepitus noted.  Some tenderness to palpation noted.  Patient does have some instability of the patellofemoral joint.  Foot exam shows the patient does have breakdown of the transverse arch.  Patient does have tenderness to palpation over the plantar aspect of the second and third toes.  No other significant findings.  No pain in the heel.    Impression and  Recommendations:    The above documentation has been reviewed and is accurate and complete Judi Saa, DO

## 2023-03-10 ENCOUNTER — Telehealth: Payer: Self-pay

## 2023-03-10 ENCOUNTER — Ambulatory Visit (INDEPENDENT_AMBULATORY_CARE_PROVIDER_SITE_OTHER): Payer: Self-pay | Admitting: Family Medicine

## 2023-03-10 ENCOUNTER — Encounter: Payer: Self-pay | Admitting: Family Medicine

## 2023-03-10 VITALS — BP 106/76 | HR 72 | Ht 62.0 in | Wt 180.0 lb

## 2023-03-10 DIAGNOSIS — M7752 Other enthesopathy of left foot: Secondary | ICD-10-CM

## 2023-03-10 DIAGNOSIS — M1711 Unilateral primary osteoarthritis, right knee: Secondary | ICD-10-CM

## 2023-03-10 NOTE — Patient Instructions (Signed)
Continue gabapentin Spenco Total Support Original Orthotics Shoes out of house Sandals in the house See rheumatology See me in 3 months

## 2023-03-10 NOTE — Assessment & Plan Note (Signed)
Bursitis of the foot secondary to breakdown of the transverse arch.  Discussed metatarsal pads and how this would be more beneficial.  Discussed which activities to do and which ones to avoid.  Increase activity slowly.  Follow-up with me again in 6 to 8 weeks otherwise.

## 2023-03-10 NOTE — Assessment & Plan Note (Signed)
Improvement noted overall.  Still only 50% better and patient was hoping for more.  Discussed icing regimen of home exercises still.  Discussed wearing the brace.  Discussed proper shoes that would be more beneficial as well.  Follow-up again in 6 to 8 weeks otherwise.

## 2023-03-11 NOTE — Telephone Encounter (Signed)
Used the language line to schedule patient for surgery. Patient agreed to be scheduled on 05/11/23 at [facility] at 11:15 am. Patient is aware she must arrive by 8:15 am. Provided pre-op instructions over the phone. Patient confirmed understanding.

## 2023-03-19 ENCOUNTER — Ambulatory Visit: Payer: Self-pay | Admitting: Podiatry

## 2023-03-30 ENCOUNTER — Ambulatory Visit (INDEPENDENT_AMBULATORY_CARE_PROVIDER_SITE_OTHER): Payer: Self-pay | Admitting: Physician Assistant

## 2023-03-30 VITALS — BP 104/68 | HR 76 | Temp 98.3°F | Resp 18 | Ht 61.0 in | Wt 184.1 lb

## 2023-03-30 DIAGNOSIS — Z86718 Personal history of other venous thrombosis and embolism: Secondary | ICD-10-CM

## 2023-03-30 DIAGNOSIS — I8393 Asymptomatic varicose veins of bilateral lower extremities: Secondary | ICD-10-CM

## 2023-03-30 DIAGNOSIS — I872 Venous insufficiency (chronic) (peripheral): Secondary | ICD-10-CM

## 2023-03-30 NOTE — Progress Notes (Signed)
Office Note     CC:  follow up Requesting Provider:  Esperanza Richters, PA-C  HPI: Taylor Parker is a 57 y.o. (May 08, 1966) female who presents for evaluation of left lower extremity pain and varicose veins.  She has history of left greater saphenous vein ablation performed by [facility] about 6 years ago.  She also has right greater saphenous vein ablation and stab phlebectomy in her history.  She states she had been diagnosed with a blood clot in her veins years ago however does not remember which vein.  She took blood thinners for a period of time then discontinued use.  She does not wear compression or elevate her legs.  She denies history of venous ulcerations or trauma to bilateral lower extremities.  An interpreter he was used for today's visit.  Patient is also complaining of pain in numerous joints throughout her body including shoulders hands knees and ankles.  Lab work indicates elevated inflammatory factors.  She has an appointment to establish with a rheumatologist in February.  She does not take any steroids.   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 , LLE nonocclusive popliteal vein,   treated with coumadin,   History of kidney stones    Hyperlipidemia    Melasma 12/22/2022   OA (osteoarthritis)    hands,  knees   PMB (postmenopausal bleeding)    PMDD (premenstrual dysphoric disorder)    PONV (postoperative nausea and vomiting)    Pre-diabetes    Primary osteoarthritis of right knee 12/28/2019   Rosacea 12/22/2022   Varicose vein of leg    w/  vensous insuff;   hx ablation left GSV 2013 in [place];   left small SV laser ablation @[facility]  06-09-2017   Wears glasses     Past Surgical History:  Procedure Laterality Date   BLADDER SURGERY  12/09/2016    per pt sling   BREAST BIOPSY  06/19/2011   Procedure: BREAST BIOPSY;  Surgeon: Emelia Loron, MD;  Location: [facility];  Service: General;  Laterality: Right;  Right breast chronic abscess drainage and debridement   BREAST DUCTAL SYSTEM EXCISION Left 01/24/2014   Procedure:  LEFT BREAST DUCT EXCISION;  Surgeon: Emelia Loron, MD;  Location: [facility];  Service: General;  Laterality: Left;   CESAREAN SECTION Bilateral 03/15/2003   x3  first two in [place];  last one @[facility]  ;   WITH BILATERAL TUBAL LIGATION   COLONOSCOPY  08/15/2021   dr Gwendalyn Ege   DILATATION & CURETTAGE/HYSTEROSCOPY WITH MYOSURE N/A 05/10/2014   Procedure: DILATATION & CURETTAGE/HYSTEROSCOPY WITH MYOSURE ABLATION,RESECTOSCOPIC POLYPECTOMY;  Surgeon: Ok Edwards, MD;  Location: [facility];  Service: Gynecology;  Laterality: N/A;  Confirmed with [employer] rep to be present.   DILITATION & CURRETTAGE/HYSTROSCOPY WITH HYDROTHERMAL ABLATION N/A 06/16/2022   Procedure: DILATATION & CURETTAGE/HYSTEROSCOPY WITH HYDROTHERMAL ABLATION;  Surgeon: Catalina Antigua, MD;  Location: [facility];  Service: Gynecology;  Laterality: N/A;   INCISE AND DRAIN ABCESS Right 03/05/2011   @[facility]   by dr Dwain Sarna;   I&D right breast abscess and incisional bx   IR EMBO VENOUS NOT HEMORR HEMANG  INC GUIDE ROADMAPPING  06/09/2017   IR RADIOLOGIST EVAL & MGMT  05/04/2017   IR RADIOLOGIST EVAL & MGMT  06/29/2017   VARICOSE VEIN SURGERY Left 2013   in [place];  left leg    Social History   Socioeconomic History   Marital status: Married    Spouse name: Not on file   Number of children: 3   Years of education: Not on file   Highest education level: 6th grade  Occupational History   Occupation: [employer]  7pm-4 am  Tobacco Use   Smoking status: Never   Smokeless tobacco: Never  Vaping Use   Vaping status: Never Used  Substance and Sexual Activity   Alcohol use: Yes    Comment: occ   Drug use: Never   Sexual activity:  Yes    Partners: Male    Birth control/protection: Surgical    Comment: 1st intercourse- 18, partners- 2, married- 15 yrs   Other Topics Concern   Not on file  Social History Narrative   Original from [place]   [place]oushold: pt, husband, son and G-son       Social Determinants of Health   Financial Resource Strain: Medium Risk (12/22/2022)   Overall Financial Resource Strain (CARDIA)    Difficulty of Paying Living Expenses: Somewhat hard  Food Insecurity: Patient Declined (12/22/2022)   Hunger Vital Sign    Worried About Running Out of Food in the Last Year: Patient declined    Ran Out of Food in the Last Year: Patient declined  Transportation Needs: No Transportation Needs (12/22/2022)   PRAPARE - Administrator, Civil Service (Medical): No    Lack of Transportation (Non-Medical): No  Physical Activity: Unknown (12/22/2022)   Exercise Vital Sign    Days of Exercise per Week: 0 days    Minutes of Exercise per Session: Not on file  Stress: Stress Concern Present (12/22/2022)   [employer] of Occupational Health - Occupational Stress Questionnaire    Feeling of Stress : To some extent  Social Connections: Socially Isolated (12/22/2022)   Social Connection and Isolation Panel [NHANES]    Frequency of Communication with Friends and Family: Twice a week    Frequency of Social Gatherings with Friends and Family: Never    Attends Religious Services: Never    Diplomatic Services operational officer: No    Attends Engineer, structural: Not on file    Marital Status: Married  Catering manager Violence: Not on file    Family History  Problem Relation Age of Onset   Heart disease Mother    Diabetes Other        gm   High Cholesterol Brother        high TG   Colon cancer Neg Hx    Breast cancer Neg Hx    Esophageal cancer Neg Hx    Stomach cancer Neg Hx    Rectal cancer Neg Hx     Current Outpatient Medications  Medication Sig Dispense Refill   buPROPion  (WELLBUTRIN XL) 150 MG 24 hr tablet Take 1 tablet (150 mg total) by mouth daily. 30 tablet 1   gabapentin (NEURONTIN) 100 MG capsule Take 2 capsules (200 mg total) by mouth at bedtime. 180 capsule 0   No current facility-administered medications for this visit.    No Known Allergies   REVIEW OF SYSTEMS:   [X]  denotes positive finding, [ ]  denotes negative finding Cardiac  Comments:  Chest pain or chest pressure:    Shortness of breath upon exertion:    Short  of breath when lying flat:    Irregular heart rhythm:        Vascular    Pain in calf, thigh, or hip brought on by ambulation:    Pain in feet at night that wakes you up from your sleep:     Blood clot in your veins:    Leg swelling:         Pulmonary    Oxygen at home:    Productive cough:     Wheezing:         Neurologic    Sudden weakness in arms or legs:     Sudden numbness in arms or legs:     Sudden onset of difficulty speaking or slurred speech:    Temporary loss of vision in one eye:     Problems with dizziness:         Gastrointestinal    Blood in stool:     Vomited blood:         Genitourinary    Burning when urinating:     Blood in urine:        Psychiatric    Major depression:         Hematologic    Bleeding problems:    Problems with blood clotting too easily:        Skin    Rashes or ulcers:        Constitutional    Fever or chills:      PHYSICAL EXAMINATION:  Vitals:   03/30/23 1045  BP: 104/68  Pulse: 76  Resp: 18  Temp: 98.3 F (36.8 C)  TempSrc: Temporal  SpO2: 94%  Weight: 184 lb 1.6 oz (83.5 kg)  Height: 5\' 1"  (1.549 m)    General:  WDWN in NAD; vital signs documented above Gait: Not observed HENT: WNL, normocephalic Pulmonary: normal non-labored breathing , without Rales, rhonchi,  wheezing Cardiac: regular HR Abdomen: soft, NT, no masses Skin: without rashes Vascular Exam/Pulses: Symmetrical DP pulses Extremities: Varicose veins left calf no hyperpigmentation,  edema, ulcerations noted Musculoskeletal: no muscle wasting or atrophy  Neurologic: A&O X 3 Psychiatric:  The pt has Normal affect.   Non-Invasive Vascular Imaging:    Left lower extremity venous reflux study negative for DVT Incompetent common femoral vein Incompetent GSV at the saphenofemoral junction however the GSV is not visualized throughout the thigh due to prior ablation therapy  Small saphenous vein in the proximal calf incompetent   ASSESSMENT/PLAN:: 57 y.o. female here for evaluation of left calf varicosities; pain in both legs and arms  Ms. Felipa Furnace has history of greater saphenous vein ablation of bilateral lower extremities including stab phlebectomy of the right leg.  She has asymptomatic varicose veins of her left calf.  Left lower extremity venous reflux study was negative for DVT.  Findings were consistent with prior greater saphenous vein ablation.  Nothing to offer from a vein standpoint other than the regular use of knee-high 15 to 20 mmHg compression, proper leg elevation periodically throughout the day, and avoiding prolonged sitting and standing.  I also encouraged her to reach out to the rheumatology office to see if she can be on a cancellation wait list to establish sooner.  She is complaining of pain in both arms and legs that impacts her day-to-day life.   Emilie Rutter, PA-C [facility] [phone]  Clinic MD:   Lenell Antu

## 2023-04-19 ENCOUNTER — Telehealth: Payer: Self-pay

## 2023-04-19 NOTE — Telephone Encounter (Signed)
Patient called nurse line today only stating that she "needs someone to speak Spanish" with her. No other details.   Maureen Ralphs RN on 04/19/23 at [phone]

## 2023-04-20 NOTE — Telephone Encounter (Signed)
Called pt with interpreter Claudia. Pt states she was contacted by office previously and all questions answered.

## 2023-04-21 ENCOUNTER — Other Ambulatory Visit (HOSPITAL_BASED_OUTPATIENT_CLINIC_OR_DEPARTMENT_OTHER): Payer: Self-pay

## 2023-04-21 ENCOUNTER — Ambulatory Visit (INDEPENDENT_AMBULATORY_CARE_PROVIDER_SITE_OTHER): Payer: Self-pay | Admitting: Medical

## 2023-04-21 VITALS — BP 106/52 | HR 66 | Temp 98.0°F | Resp 16 | Wt 186.0 lb

## 2023-04-21 DIAGNOSIS — R350 Frequency of micturition: Secondary | ICD-10-CM

## 2023-04-21 DIAGNOSIS — M549 Dorsalgia, unspecified: Secondary | ICD-10-CM

## 2023-04-21 DIAGNOSIS — Z23 Encounter for immunization: Secondary | ICD-10-CM

## 2023-04-21 DIAGNOSIS — N3000 Acute cystitis without hematuria: Secondary | ICD-10-CM

## 2023-04-21 LAB — POC URINALSYSI DIPSTICK (AUTOMATED)
Bilirubin, UA: NEGATIVE
Blood, UA: NEGATIVE
Glucose, UA: NEGATIVE
Ketones, UA: NEGATIVE
Leukocytes, UA: NEGATIVE
Nitrite, UA: POSITIVE
Protein, UA: NEGATIVE
Spec Grav, UA: 1.01 (ref 1.010–1.025)
Urobilinogen, UA: 0.2 U/dL
pH, UA: 6.5 (ref 5.0–8.0)

## 2023-04-21 MED ORDER — CEPHALEXIN 500 MG PO CAPS
500.0000 mg | ORAL_CAPSULE | Freq: Two times a day (BID) | ORAL | 0 refills | Status: DC
Start: 2023-04-21 — End: 2023-04-21

## 2023-04-21 MED ORDER — CEPHALEXIN 500 MG PO CAPS
500.0000 mg | ORAL_CAPSULE | Freq: Two times a day (BID) | ORAL | 0 refills | Status: DC
  Filled 2023-04-21: qty 14, 7d supply, fill #0

## 2023-04-21 MED ORDER — PHENAZOPYRIDINE HCL 200 MG PO TABS
200.0000 mg | ORAL_TABLET | Freq: Three times a day (TID) | ORAL | 0 refills | Status: DC | PRN
  Filled 2023-04-21: qty 6, 2d supply, fill #0

## 2023-04-21 MED ORDER — PHENAZOPYRIDINE HCL 200 MG PO TABS: 200.0000 mg | ORAL_TABLET | Freq: Three times a day (TID) | ORAL | 0 refills | Status: DC | PRN

## 2023-04-21 MED ORDER — CEFTRIAXONE SODIUM 1 G IJ SOLR
1.0000 g | Freq: Once | INTRAMUSCULAR | Status: AC
Start: 2023-04-21 — End: 2023-04-21
  Administered 2023-04-21: 1 g via INTRAMUSCULAR

## 2023-04-21 NOTE — Progress Notes (Signed)
Subjective:    Patient ID: Taylor Parker, female    DOB: August 06, 1965, 57 y.o.   MRN: [id number]  HPI  Pt has 3 days of frequent urination and burning. Rt side faint cva pain since Sunday. No fever, no chills, no nausea or vomiting.  Pt does not have chronic uti.  Pt ua today has nitrites. No blood.   Some bilateral CVA pain for pain 3 days. Rt side worse than left.    Review of Systems  Constitutional:  Negative for chills, fatigue and fever.  Respiratory:  Negative for cough, chest tightness, shortness of breath and wheezing.   Cardiovascular:  Negative for chest pain and palpitations.  Gastrointestinal:  Negative for abdominal pain.  Genitourinary:  Positive for dysuria and frequency. Negative for urgency.  Musculoskeletal:  Positive for back pain.       Faint minimal rt cva pain reported.  Skin:  Negative for rash.  Hematological:  Negative for adenopathy. Does not bruise/bleed easily.  Psychiatric/Behavioral:  Negative for behavioral problems, confusion and hallucinations.     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 , LLE nonocclusive popliteal vein,   treated with coumadin,   History of kidney stones    Hyperlipidemia    Melasma 12/22/2022   OA (osteoarthritis)    hands,  knees   PMB (postmenopausal bleeding)    PMDD (premenstrual dysphoric disorder)    PONV (postoperative nausea and vomiting)    Pre-diabetes    Primary osteoarthritis of right knee 12/28/2019   Rosacea 12/22/2022   Varicose vein of leg    w/  vensous insuff;   hx ablation left GSV 2013 in [place];   left small SV laser ablation @[facility]  06-09-2017   Wears glasses      Social History   Socioeconomic History   Marital status: Married    Spouse name: Not on file   Number  of children: 3   Years of education: Not on file   Highest education level: 6th grade  Occupational History   Occupation: [employer]  7pm-4 am  Tobacco Use   Smoking status: Never   Smokeless tobacco: Never  Vaping Use   Vaping status: Never Used  Substance and Sexual Activity   Alcohol use: Yes    Comment: occ   Drug use: Never   Sexual activity: Yes    Partners: Male    Birth control/protection: Surgical    Comment: 1st intercourse- 18, partners- 2, married- 15 yrs   Other Topics Concern   Not on file  Social History Narrative   Original from [place]   Houshold: pt, husband, son and G-son       Social Determinants of Health   Financial Resource Strain: Medium Risk (12/22/2022)   Overall Financial Resource Strain (CARDIA)    Difficulty of Paying Living Expenses: Somewhat hard  Food Insecurity: Patient Declined (12/22/2022)   Hunger Vital Sign    Worried About Running Out of Food in the Last Year: Patient declined    Ran Out of Food in the Last Year: Patient declined  Transportation Needs: No Transportation Needs (12/22/2022)   PRAPARE - Administrator, Civil Service (Medical): No  Lack of Transportation (Non-Medical): No  Physical Activity: Unknown (12/22/2022)   Exercise Vital Sign    Days of Exercise per Week: 0 days    Minutes of Exercise per Session: Not on file  Stress: Stress Concern Present (12/22/2022)   [employer] of Occupational Health - Occupational Stress Questionnaire    Feeling of Stress : To some extent  Social Connections: Socially Isolated (12/22/2022)   Social Connection and Isolation Panel [NHANES]    Frequency of Communication with Friends and Family: Twice a week    Frequency of Social Gatherings with Friends and Family: Never    Attends Religious Services: Never    Database administrator or Organizations: No    Attends Engineer, structural: Not on file    Marital Status: Married  Catering manager Violence: Not on file     Past Surgical History:  Procedure Laterality Date   BLADDER SURGERY  12/09/2016   per pt sling   BREAST BIOPSY  06/19/2011   Procedure: BREAST BIOPSY;  Surgeon: Emelia Loron, MD;  Location: [facility];  Service: General;  Laterality: Right;  Right breast chronic abscess drainage and debridement   BREAST DUCTAL SYSTEM EXCISION Left 01/24/2014   Procedure:  LEFT BREAST DUCT EXCISION;  Surgeon: Emelia Loron, MD;  Location: [facility];  Service: General;  Laterality: Left;   CESAREAN SECTION Bilateral 03/15/2003   x3  first two in [place];  last one @[facility]  ;   WITH BILATERAL TUBAL LIGATION   COLONOSCOPY  08/15/2021   dr Gwendalyn Ege   DILATATION & CURETTAGE/HYSTEROSCOPY WITH MYOSURE N/A 05/10/2014   Procedure: DILATATION & CURETTAGE/HYSTEROSCOPY WITH MYOSURE ABLATION,RESECTOSCOPIC POLYPECTOMY;  Surgeon: Ok Edwards, MD;  Location: [facility];  Service: Gynecology;  Laterality: N/A;  Confirmed with [employer] rep to be present.   DILITATION & CURRETTAGE/HYSTROSCOPY WITH HYDROTHERMAL ABLATION N/A 06/16/2022   Procedure: DILATATION & CURETTAGE/HYSTEROSCOPY WITH HYDROTHERMAL ABLATION;  Surgeon: Catalina Antigua, MD;  Location: [facility];  Service: Gynecology;  Laterality: N/A;   INCISE AND DRAIN ABCESS Right 03/05/2011   @[facility]   by dr Dwain Sarna;   I&D right breast abscess and incisional bx   IR EMBO VENOUS NOT HEMORR HEMANG  INC GUIDE ROADMAPPING  06/09/2017   IR RADIOLOGIST EVAL & MGMT  05/04/2017   IR RADIOLOGIST EVAL & MGMT  06/29/2017   VARICOSE VEIN SURGERY Left 2013   in [place];  left leg    Family History  Problem Relation Age of Onset   Heart disease Mother    Diabetes Other        gm   High Cholesterol Brother        high TG   Colon cancer Neg Hx    Breast cancer Neg Hx    Esophageal cancer Neg Hx    Stomach cancer Neg Hx    Rectal cancer Neg Hx     No Known Allergies  Current Outpatient Medications on File Prior to Visit  Medication Sig Dispense  Refill   buPROPion (WELLBUTRIN XL) 150 MG 24 hr tablet Take 1 tablet (150 mg total) by mouth daily. 30 tablet 1   gabapentin (NEURONTIN) 100 MG capsule Take 2 capsules (200 mg total) by mouth at bedtime. 180 capsule 0   No current facility-administered medications on file prior to visit.    BP (!) 106/52 (BP Location: Right Arm, Patient Position: Sitting, Cuff Size: Small)   Pulse 66   Temp 98 F (36.7 C) (Oral)   Resp 16  Wt 186 lb (84.4 kg)   LMP 08/31/2020 (Exact Date)   SpO2 97%   BMI 35.14 kg/m        Objective:   Physical Exam   General- No acute distress. Pleasant patient. Neck- Full range of motion, no jvd Lungs- Clear, even and unlabored. Heart- regular rate and rhythm. Neurologic- CNII- XII grossly intact.  Back- mild bilateral cva pain. More on rt side perfect.     Assessment & Plan:   Patient Instructions  Your appear to have a urinary tract infection by signs/symptoms and urine does show nitrites. I am prescribing  keflex antibiotic for the probable infection. Hydrate well. I am sending out a urine culture. During the interim if your signs and symptoms worsen rather than improving please notify us. We will notify your when the culture results are back.  Rx pyridium for urinary pain.  Follow up in 7 days or as needed.    Esperanza Richters, PA-C

## 2023-04-21 NOTE — Patient Instructions (Addendum)
Your appear to have a urinary tract infection by signs/symptoms and urine does show nitrites. I am prescribing  keflex antibiotic for the probable infection. Hydrate well. I am sending out a urine culture. During the interim if your signs and symptoms worsen rather than improving please notify us. We will notify your when the culture results are back.  Rx pyridium for urinary pain.  On exam faint bilateral cva pain. So decided to order rocephin 1 gram IM. Advise start keflex tomorrow. Then treat according to culture/sensitivity results.  Follow up in 7 days or as needed.

## 2023-04-22 LAB — URINE CULTURE
MICRO NUMBER:: 15572785
Result:: NO GROWTH
SPECIMEN QUALITY:: ADEQUATE

## 2023-05-11 ENCOUNTER — Other Ambulatory Visit: Payer: Self-pay | Admitting: Hematology and Oncology

## 2023-05-11 DIAGNOSIS — Z124 Encounter for screening for malignant neoplasm of cervix: Secondary | ICD-10-CM

## 2023-05-11 NOTE — Progress Notes (Signed)
Patient: Taylor Parker           Date of Birth: 1966/03/15           MRN: [id number] Visit Date: 05/11/2023 PCP: Esperanza Richters, PA-C  Cervical Cancer Screening Do you smoke?: No Have you ever had or been told you have an allergy to latex products?: No Marital status: Married Date of last pap smear: 1-2 yrs ago Date of last menstrual period:  (Postmenopause) Number of pregnancies: 3 Number of births: 3 Have you ever had any of the following? Hysterectomy: No Tubal ligation (tubes tied): No Abnormal bleeding: No (Pt has has a scheduled hysterectomy) Abnormal pap smear: Yes Venereal warts: No A sex partner with venereal warts: No A high risk* sex partner: No  Cervical Exam Pap smear completed: Pap test Abnormal Observations: Normal Recommendations: 03/24/2022 Pap smear was LSIL with HPV-. Patient is scheduling hysterectomy for abnormal uterine bleeding. If Pap smear is normal and hysterectomy is within the year, she will no longer need Pap smear.       Patient's History Patient Active Problem List   Diagnosis Date Noted   Bursitis of left foot 03/10/2023   Patellofemoral arthritis of right knee 12/11/2022   Lumbar radiculopathy 12/11/2022   Postmenopausal vaginal bleeding 06/16/2022   NAFLD (nonalcoholic fatty liver disease) 40/98/1191   Osteopenia 12/21/2021   Colon polyps 12/21/2021   Prediabetes 12/04/2021   Positive screening for depression on 9-item Patient Health Questionnaire (PHQ-9) 12/04/2021   Encounter to establish care 12/04/2021   Osteoarthritis 05/11/2017   Personal history of DVT (deep vein thrombosis) 05/19/2016   Varicose veins with pain 04/25/2014   Chronic venous insufficiency 04/25/2014   Hyperlipidemia 09/03/2011   Past Medical History:  Diagnosis Date   Anxiety    Bilateral low back pain with bilateral sciatica    Bursitis of hip    Headache(784.0)    otc med prn   History of abnormal cervical Pap smear    History of DVT of lower  extremity 10/2002   4 months preg. , LLE nonocclusive popliteal vein,   treated with coumadin,   History of kidney stones    Hyperlipidemia    Melasma 12/22/2022   OA (osteoarthritis)    hands,  knees   PMB (postmenopausal bleeding)    PMDD (premenstrual dysphoric disorder)    PONV (postoperative nausea and vomiting)    Pre-diabetes    Primary osteoarthritis of right knee 12/28/2019   Rosacea 12/22/2022   Varicose vein of leg    w/  vensous insuff;   hx ablation left GSV 2013 in [place];   left small SV laser ablation @[facility]  06-09-2017   Wears glasses     Family History  Problem Relation Age of Onset   Heart disease Mother    Diabetes Other        gm   High Cholesterol Brother        high TG   Colon cancer Neg Hx    Breast cancer Neg Hx    Esophageal cancer Neg Hx    Stomach cancer Neg Hx    Rectal cancer Neg Hx     Social History   Occupational History   Occupation: [employer]  7pm-4 am  Tobacco Use   Smoking status: Never   Smokeless tobacco: Never  Vaping Use   Vaping status: Never Used  Substance and Sexual Activity   Alcohol use: Yes    Comment: occ   Drug use: Never   Sexual activity:  Yes    Partners: Male    Birth control/protection: Surgical    Comment: 1st intercourse- 18, partners- 2, married- 15 yrs

## 2023-05-13 ENCOUNTER — Ambulatory Visit (INDEPENDENT_AMBULATORY_CARE_PROVIDER_SITE_OTHER): Payer: Self-pay | Admitting: Obstetrics and Gynecology

## 2023-05-13 VITALS — BP 103/66 | HR 63 | Wt 185.3 lb

## 2023-05-13 DIAGNOSIS — Z9889 Other specified postprocedural states: Secondary | ICD-10-CM

## 2023-05-13 DIAGNOSIS — Z1331 Encounter for screening for depression: Secondary | ICD-10-CM

## 2023-05-13 DIAGNOSIS — N950 Postmenopausal bleeding: Secondary | ICD-10-CM

## 2023-05-13 NOTE — Progress Notes (Signed)
GYNECOLOGY VISIT  Patient name: Taylor Parker MRN [id number]  Date of birth: 03-11-1966 Chief Complaint:   Follow-up  History:  Taylor Parker is a 58 y.o. [phone] being seen today for preop visit. Bleeding has been better since last visit. Given the re-scheduling, she wasn't sure if she should keep surgery scheduled. A little bleeding after pap smear 2 days ago but has only had sporadic bleeding in the last few months. If she is doing any heavy lifting she will have a little bleeding and then resolve. When she bleeds she notices it when she wipes only. Often it's light or brown/coffee colored.   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 , LLE nonocclusive popliteal vein,   treated with coumadin,   History of kidney stones    Hyperlipidemia    Melasma 12/22/2022   OA (osteoarthritis)    hands,  knees   PMB (postmenopausal bleeding)    PMDD (premenstrual dysphoric disorder)    PONV (postoperative nausea and vomiting)    Pre-diabetes    Primary osteoarthritis of right knee 12/28/2019   Rosacea 12/22/2022   Varicose vein of leg    w/  vensous insuff;   hx ablation left GSV 2013 in [place];   left small SV laser ablation @[facility]  06-09-2017   Wears glasses     Past Surgical History:  Procedure Laterality Date   BLADDER SURGERY  12/09/2016   per pt sling   BREAST BIOPSY  06/19/2011   Procedure: BREAST BIOPSY;  Surgeon: Emelia Loron, MD;  Location: [facility];  Service: General;  Laterality: Right;  Right breast chronic abscess drainage and debridement   BREAST DUCTAL SYSTEM EXCISION Left 01/24/2014   Procedure:  LEFT BREAST DUCT EXCISION;  Surgeon: Emelia Loron, MD;  Location: [facility];  Service: General;  Laterality: Left;    CESAREAN SECTION Bilateral 03/15/2003   x3  first two in [place];  last one @[facility]  ;   WITH BILATERAL TUBAL LIGATION   COLONOSCOPY  08/15/2021   dr Gwendalyn Ege   DILATATION & CURETTAGE/HYSTEROSCOPY WITH MYOSURE N/A 05/10/2014   Procedure: DILATATION & CURETTAGE/HYSTEROSCOPY WITH MYOSURE ABLATION,RESECTOSCOPIC POLYPECTOMY;  Surgeon: Ok Edwards, MD;  Location: [facility];  Service: Gynecology;  Laterality: N/A;  Confirmed with [employer] rep to be present.   DILITATION & CURRETTAGE/HYSTROSCOPY WITH HYDROTHERMAL ABLATION N/A 06/16/2022   Procedure: DILATATION & CURETTAGE/HYSTEROSCOPY WITH HYDROTHERMAL ABLATION;  Surgeon: Catalina Antigua, MD;  Location: [facility];  Service: Gynecology;  Laterality: N/A;   INCISE AND DRAIN ABCESS Right 03/05/2011   @[facility]   by dr Dwain Sarna;   I&D right breast abscess and incisional bx   IR EMBO VENOUS NOT HEMORR HEMANG  INC GUIDE ROADMAPPING  06/09/2017   IR RADIOLOGIST EVAL & MGMT  05/04/2017   IR RADIOLOGIST EVAL & MGMT  06/29/2017   VARICOSE VEIN SURGERY Left 2013   in [place];  left leg    The following portions of the patient's history were reviewed and updated as appropriate: allergies, current medications, past family history, past medical history, past social history, past surgical history and problem list.   Health Maintenance:   Last pap     Component Value Date/Time  DIAGPAP - Low grade squamous intraepithelial lesion (LSIL) (A) 03/24/2022 1447   HPVHIGH Negative 03/24/2022 1447   ADEQPAP  03/24/2022 1447    Satisfactory for evaluation; transformation zone component ABSENT.    High Risk HPV: Positive  Adequacy:  Satisfactory for evaluation, transformation zone component PRESENT  Diagnosis:  Atypical squamous cells of undetermined significance (ASC-US)  Last mammogram: 11/2022 BIRADS 1   Review of Systems:  Pertinent items are noted in HPI. Comprehensive review of systems was otherwise negative.   Objective:  Physical Exam BP 103/66    Pulse 63   Wt 185 lb 4.8 oz (84.1 kg)   LMP 08/31/2020 (Exact Date)   BMI 35.01 kg/m    Physical Exam Vitals and nursing note reviewed.  Constitutional:      Appearance: Normal appearance.  HENT:     Head: Normocephalic and atraumatic.  Pulmonary:     Effort: Pulmonary effort is normal.  Skin:    General: Skin is warm and dry.  Neurological:     General: No focal deficit present.     Mental Status: She is alert.  Psychiatric:        Mood and Affect: Mood normal.        Behavior: Behavior normal.        Thought Content: Thought content normal.        Judgment: Judgment normal.       Assessment & Plan:   1. Postmenopausal vaginal bleeding 2. History of endometrial ablation Discussed that she may have needed more time for ablation to take. She would like to have [place] repeated and see if she bleeding again before having surgery and will cancel for now. Has not had any bleeding and given age, likely will not have recurrence. Encouraged to reach out if bleeding resumes. Will message scheduler to cancel upcoming hysterectomy.  - US PELVIC COMPLETE WITH TRANSVAGINAL; Future   Routine preventative health maintenance measures emphasized.  Lorriane Shire, MD Minimally Invasive Gynecologic Surgery Center for [facility], [facility]

## 2023-05-18 LAB — CYTOLOGY - PAP
Adequacy: ABSENT
Comment: NEGATIVE
Diagnosis: NEGATIVE
High risk HPV: NEGATIVE

## 2023-05-19 ENCOUNTER — Ambulatory Visit (HOSPITAL_COMMUNITY)
Admission: RE | Admit: 2023-05-19 | Discharge: 2023-05-19 | Disposition: A | Discharge status: Home / Self Care | Payer: Self-pay | Source: Ambulatory Visit | Attending: Obstetrics and Gynecology | Admitting: Obstetrics and Gynecology

## 2023-05-19 DIAGNOSIS — Z9889 Other specified postprocedural states: Secondary | ICD-10-CM | POA: Insufficient documentation

## 2023-05-19 DIAGNOSIS — N950 Postmenopausal bleeding: Secondary | ICD-10-CM | POA: Insufficient documentation

## 2023-06-07 ENCOUNTER — Telehealth: Payer: Self-pay | Admitting: General Practice

## 2023-06-07 NOTE — Telephone Encounter (Signed)
Called patient with Taylor Parker assisting with spanish interpretation and reviewed ultrasound results with her. Patient verbalized understanding.

## 2023-06-07 NOTE — Telephone Encounter (Signed)
-----   Message from Melissa sent at 06/07/2023  1:12 PM EST ----- Notify that cyst previously seen has completely gone away and lining is normal as well. Small fibroid possibly, but otherwise, normal ultrasound.

## 2023-06-09 ENCOUNTER — Ambulatory Visit: Admit: 2023-06-09 | Payer: Self-pay | Admitting: Obstetrics and Gynecology

## 2023-06-09 SURGERY — HYSTERECTOMY, TOTAL, ROBOT-ASSISTED, LAPAROSCOPIC, WITH BILATERAL SALPINGO-OOPHORECTOMY
Anesthesia: Choice

## 2023-06-15 NOTE — Progress Notes (Unsigned)
[name] [address] [place] [address] Phone: [phone] Subjective:   [name], [name], [name]  CC: Right leg pain  ONG:[id number]  03/10/2023 Bursitis of the foot secondary to breakdown of the transverse arch.  Discussed metatarsal pads and how this would be more beneficial.  Discussed which activities to do and which ones to avoid.  Increase activity slowly.  Follow-up with me again in 6 to 8 weeks otherwise.     Improvement noted overall.  Still only 50% better and patient was hoping for more.  Discussed icing regimen of home exercises still.  Discussed wearing the brace.  Discussed proper shoes that would be more beneficial as well.  Follow-up again in 6 to 8 weeks otherwise.      Update 06/16/2023 Taylor Parker is a 57 y.o. female coming in with complaint of R knee, L shoulder and L foot pain. Patient states that her knee swelling has decreased. Pain in the knee seems to be radiating from lower back.   L foot pain continues over metatarsal heads. Also has similar pain on the R side. Worse in the mornings and if she wears tennis shoes her pain increases her pain but she does not typically wear tennis shoes.   Pain in L shoulder began 2 weeks ago. Pain in anterior aspect with flexion. Denies any radiating symptoms. No history of shoulder pain.   Patient is going to see Rheumatology in February.         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 , LLE nonocclusive popliteal vein,   treated with coumadin,   History of kidney stones    Hyperlipidemia    Melasma 12/22/2022   OA (osteoarthritis)    hands,   knees   PMB (postmenopausal bleeding)    PMDD (premenstrual dysphoric disorder)    PONV (postoperative nausea and vomiting)    Pre-diabetes    Primary osteoarthritis of right knee 12/28/2019   Rosacea 12/22/2022   Varicose vein of leg    w/  vensous insuff;   hx ablation left GSV 2013 in [place];   left small SV laser ablation [facility]  06-09-2017   Wears glasses    Past Surgical History:  Procedure Laterality Date   BLADDER SURGERY  12/09/2016   per pt sling   BREAST BIOPSY  06/19/2011   Procedure: BREAST BIOPSY;  Surgeon: Emelia Loron, MD;  Location: [facility];  Service: General;  Laterality: Right;  Right breast chronic abscess drainage and debridement   BREAST DUCTAL SYSTEM EXCISION Left 01/24/2014   Procedure:  LEFT BREAST DUCT EXCISION;  Surgeon: Emelia Loron, MD;  Location: [facility];  Service: General;  Laterality: Left;   CESAREAN SECTION Bilateral 03/15/2003   x3  first two in [place];  last one @[facility]  ;   WITH BILATERAL TUBAL LIGATION   COLONOSCOPY  08/15/2021   dr Gwendalyn Ege   DILATATION & CURETTAGE/HYSTEROSCOPY WITH MYOSURE N/A 05/10/2014   Procedure: DILATATION & CURETTAGE/HYSTEROSCOPY WITH MYOSURE ABLATION,RESECTOSCOPIC POLYPECTOMY;  Surgeon: Ok Edwards, MD;  Location: [facility];  Service: Gynecology;  Laterality: N/A;  Confirmed with [employer] rep to be present.   DILITATION & CURRETTAGE/HYSTROSCOPY WITH HYDROTHERMAL ABLATION N/A 06/16/2022   Procedure: DILATATION & CURETTAGE/HYSTEROSCOPY WITH HYDROTHERMAL ABLATION;  Surgeon: Catalina Antigua, MD;  Location: [facility];  Service: Gynecology;  Laterality: N/A;   INCISE AND DRAIN ABCESS Right 03/05/2011   @[facility]   by dr Dwain Sarna;   I&D right breast abscess and incisional bx   IR EMBO VENOUS NOT HEMORR HEMANG  INC GUIDE ROADMAPPING  06/09/2017   IR RADIOLOGIST EVAL & MGMT  05/04/2017   IR RADIOLOGIST EVAL & MGMT  06/29/2017   VARICOSE VEIN SURGERY Left 2013   in [place];  left leg   Social History    Socioeconomic History   Marital status: Married    Spouse name: Not on file   Number of children: 3   Years of education: Not on file   Highest education level: 6th grade  Occupational History   Occupation: [employer]  7pm-4 am  Tobacco Use   Smoking status: Never   Smokeless tobacco: Never  Vaping Use   Vaping status: Never Used  Substance and Sexual Activity   Alcohol use: Yes    Comment: occ   Drug use: Never   Sexual activity: Yes    Partners: Male    Birth control/protection: Surgical    Comment: 1st intercourse- 18, partners- 2, married- 15 yrs   Other Topics Concern   Not on file  Social History Narrative   Original from [place]   [place]: pt, husband, son and G-son       Social Determinants of Health   Financial Resource Strain: Medium Risk (12/22/2022)   Overall Financial Resource Strain (CARDIA)    Difficulty of Paying Living Expenses: Somewhat hard  Food Insecurity: Patient Declined (12/22/2022)   Hunger Vital Sign    Worried About Running Out of Food in the Last Year: Patient declined    Ran Out of Food in the Last Year: Patient declined  Transportation Needs: No Transportation Needs (12/22/2022)   PRAPARE - Administrator, Civil Service (Medical): No    Lack of Transportation (Non-Medical): No  Physical Activity: Unknown (12/22/2022)   Exercise Vital Sign    Days of Exercise per Week: 0 days    Minutes of Exercise per Session: Not on file  Stress: Stress Concern Present (12/22/2022)   [employer] of Occupational Health - Occupational Stress Questionnaire    Feeling of Stress : To some extent  Social Connections: Socially Isolated (12/22/2022)   Social Connection and Isolation Panel [NHANES]    Frequency of Communication with Friends and Family: Twice a week    Frequency of Social Gatherings with Friends and Family: Never    Attends Religious Services: Never    Diplomatic Services operational officer: No    Attends Hospital doctor: Not on file    Marital Status: Married   No Known Allergies Family History  Problem Relation Age of Onset   Heart disease Mother    Diabetes Other        gm   High Cholesterol Brother        high TG   Colon cancer Neg Hx    Breast cancer Neg Hx    Esophageal cancer Neg Hx    Stomach cancer Neg Hx    Rectal cancer Neg Hx          Current Outpatient Medications (Other):    gabapentin (  NEURONTIN) 100 MG capsule, Take 2 capsules (200 mg total) by mouth at bedtime.   Reviewed prior external information including notes and imaging from  primary care provider As well as notes that were available from care everywhere and other healthcare systems.  Past medical history, social, surgical and family history all reviewed in electronic medical record.  No pertanent information unless stated regarding to the chief complaint.   Review of Systems:  No headache, visual changes, nausea, vomiting, diarrhea, constipation, dizziness, abdominal pain, skin rash, fevers, chills, night sweats, weight loss, swollen lymph nodes, body aches, joint swelling, chest pain, shortness of breath, mood changes. POSITIVE muscle aches  Objective  Blood pressure 96/66, pulse (!) 55, height 5\' 1"  (1.549 m), weight 188 lb (85.3 kg), last menstrual period 08/31/2020, SpO2 98%.   General: No apparent distress alert and oriented x3 mood and affect normal, dressed appropriately.  Patient does have HEENT: Pupils equal, extraocular movements intact  Respiratory: Patient's speak in full sentences and does not appear short of breath  Cardiovascular: Trace edema or dependent edema noted to the lower extremities bilaterally. Low back does have some loss lordosis noted.  Positive straight leg test noted.  Patient does have some radicular symptoms noted. Bilateral shoulders do have positive crossover noted.  Some tenderness to palpation over both of the areas.  Procedure: Real-time Ultrasound Guided Injection of  right acromioclavicular joint Device: GE Logiq Q7 Ultrasound guided injection is preferred based studies that show increased duration, increased effect, greater accuracy, decreased procedural pain, increased response rate, and decreased cost with ultrasound guided versus blind injection.  Verbal informed consent obtained.  Time-out conducted.  Noted no overlying erythema, induration, or other signs of local infection.  Skin prepped in a sterile fashion.  Local anesthesia: Topical Ethyl chloride.  With sterile technique and under real time ultrasound guidance: With a 25-gauge half inch needle injected with 0.5 cc of 0.5% Marcaine and 0.5 cc of Kenalog 40 mg/mL Completed without difficulty  Pain immediately improved suggesting accurate placement of the medication.  Advised to call if fevers/chills, erythema, induration, drainage, or persistent bleeding.  Impression: Technically successful ultrasound guided injection.  Procedure: Real-time Ultrasound Guided Injection of left acromioclavicular joint Device: GE Logiq Q7 Ultrasound guided injection is preferred based studies that show increased duration, increased effect, greater accuracy, decreased procedural pain, increased response rate, and decreased cost with ultrasound guided versus blind injection.  Verbal informed consent obtained.  Time-out conducted.  Noted no overlying erythema, induration, or other signs of local infection.  Skin prepped in a sterile fashion.  Local anesthesia: Topical Ethyl chloride.  With sterile technique and under real time ultrasound guidance: With a 25-gauge half inch needle injected with 0.5 cc of 0.5% Marcaine and 0.5 cc of Kenalog 40 mg/mL Completed without difficulty  Pain immediately resolved suggesting accurate placement of the medication.  Advised to call if fevers/chills, erythema, induration, drainage, or persistent bleeding.  Impression: Technically successful ultrasound guided injection.    Impression and Recommendations:    The above documentation has been reviewed and is accurate and complete Judi Saa, DO

## 2023-06-16 ENCOUNTER — Encounter: Payer: Self-pay | Admitting: Family Medicine

## 2023-06-16 ENCOUNTER — Ambulatory Visit (INDEPENDENT_AMBULATORY_CARE_PROVIDER_SITE_OTHER): Payer: Self-pay | Admitting: Family Medicine

## 2023-06-16 ENCOUNTER — Other Ambulatory Visit: Payer: Self-pay

## 2023-06-16 VITALS — BP 96/66 | HR 55 | Ht 61.0 in | Wt 188.0 lb

## 2023-06-16 DIAGNOSIS — M19012 Primary osteoarthritis, left shoulder: Secondary | ICD-10-CM

## 2023-06-16 DIAGNOSIS — M79672 Pain in left foot: Secondary | ICD-10-CM

## 2023-06-16 DIAGNOSIS — M545 Low back pain, unspecified: Secondary | ICD-10-CM

## 2023-06-16 DIAGNOSIS — M19011 Primary osteoarthritis, right shoulder: Secondary | ICD-10-CM

## 2023-06-16 DIAGNOSIS — M19019 Primary osteoarthritis, unspecified shoulder: Secondary | ICD-10-CM | POA: Insufficient documentation

## 2023-06-16 DIAGNOSIS — M5416 Radiculopathy, lumbar region: Secondary | ICD-10-CM

## 2023-06-16 NOTE — Assessment & Plan Note (Signed)
Chronic problem with exacerbation noted.  Discussed icing regimen and home exercises.  Discussed which activities to do things to avoid.  Patient has failed all conservative therapy including gabapentin 100 mg to 200 mg at night, worsening daily activities at the moment as well.  Feel we should consider advanced imaging with this also waking her up at night.  Will order MRI and see if patient is a candidate for potential epidurals or other types of injections.  Follow-up with me after imaging to discuss further.  Total time with patient today 32 minutes secondary to the use of translator and this does not include the procedures of patient's shoulder at the moment.

## 2023-06-16 NOTE — Patient Instructions (Addendum)
Injected both AC jts today Exercises MRI lumbar [id number] See me in 6-8 weeks

## 2023-06-16 NOTE — Assessment & Plan Note (Signed)
Bilateral injections given today.  Could be some of the underlying autoimmune disease and patient is going to be seeing rheumatology at some point.  Discussed icing regimen and home exercises otherwise.  Discussed strengthening exercises of the upper back.  Gabapentin prescribed as well to help.  Follow-up again in 6 to 8 weeks

## 2023-06-28 ENCOUNTER — Ambulatory Visit
Admission: RE | Admit: 2023-06-28 | Discharge: 2023-06-28 | Disposition: A | Discharge status: Home / Self Care | Payer: Self-pay | Source: Ambulatory Visit | Attending: Family Medicine | Admitting: Family Medicine

## 2023-06-28 DIAGNOSIS — M545 Low back pain, unspecified: Secondary | ICD-10-CM

## 2023-06-29 ENCOUNTER — Ambulatory Visit (INDEPENDENT_AMBULATORY_CARE_PROVIDER_SITE_OTHER): Payer: Self-pay | Admitting: Obstetrics and Gynecology

## 2023-06-29 ENCOUNTER — Other Ambulatory Visit: Payer: Self-pay

## 2023-06-29 VITALS — BP 110/54 | HR 63 | Wt 187.0 lb

## 2023-06-29 DIAGNOSIS — N950 Postmenopausal bleeding: Secondary | ICD-10-CM

## 2023-06-29 DIAGNOSIS — Z9889 Other specified postprocedural states: Secondary | ICD-10-CM

## 2023-06-29 NOTE — Progress Notes (Unsigned)
GYNECOLOGY VISIT  Patient name: Taylor Parker MRN [id number]  Date of birth: 14-Feb-1966 Chief Complaint:   Follow-up   History:  Taylor Parker is a 57 y.o. [phone] being seen today for follow up after [place] and of bleeding.    Noted to have had some light bleeding after [place] completed. Moderate to heavy bleeding this past Sunday and changing her pad 3-4x a day.  Provera and megace not well tolerated, including experiencing SI on the medication.   Would like to re-schedule hysterectomy due to continued intermittent bleeding.   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 , LLE nonocclusive popliteal vein,   treated with coumadin,   History of kidney stones    Hyperlipidemia    Melasma 12/22/2022   OA (osteoarthritis)    hands,  knees   PMB (postmenopausal bleeding)    PMDD (premenstrual dysphoric disorder)    PONV (postoperative nausea and vomiting)    Pre-diabetes    Primary osteoarthritis of right knee 12/28/2019   Rosacea 12/22/2022   Varicose vein of leg    w/  vensous insuff;   hx ablation left GSV 2013 in [place];   left small SV laser ablation @[facility]  06-09-2017   Wears glasses     Past Surgical History:  Procedure Laterality Date   BLADDER SURGERY  12/09/2016   per pt sling   BREAST BIOPSY  06/19/2011   Procedure: BREAST BIOPSY;  Surgeon: Emelia Loron, MD;  Location: [facility];  Service: General;  Laterality: Right;  Right breast chronic abscess drainage and debridement   BREAST DUCTAL SYSTEM EXCISION Left 01/24/2014   Procedure:  LEFT BREAST DUCT EXCISION;  Surgeon: Emelia Loron, MD;  Location: [facility];  Service: General;  Laterality: Left;   CESAREAN SECTION Bilateral 03/15/2003   x3  first two in [place];  last  one @[facility]  ;   WITH BILATERAL TUBAL LIGATION   COLONOSCOPY  08/15/2021   dr Gwendalyn Ege   DILATATION & CURETTAGE/HYSTEROSCOPY WITH MYOSURE N/A 05/10/2014   Procedure: DILATATION & CURETTAGE/HYSTEROSCOPY WITH MYOSURE ABLATION,RESECTOSCOPIC POLYPECTOMY;  Surgeon: Ok Edwards, MD;  Location: [facility];  Service: Gynecology;  Laterality: N/A;  Confirmed with [employer] rep to be present.   DILITATION & CURRETTAGE/HYSTROSCOPY WITH HYDROTHERMAL ABLATION N/A 06/16/2022   Procedure: DILATATION & CURETTAGE/HYSTEROSCOPY WITH HYDROTHERMAL ABLATION;  Surgeon: Catalina Antigua, MD;  Location: [facility];  Service: Gynecology;  Laterality: N/A;   INCISE AND DRAIN ABCESS Right 03/05/2011   @[facility]   by dr Dwain Sarna;   I&D right breast abscess and incisional bx   IR EMBO VENOUS NOT HEMORR HEMANG  INC GUIDE ROADMAPPING  06/09/2017   IR RADIOLOGIST EVAL & MGMT  05/04/2017   IR RADIOLOGIST EVAL & MGMT  06/29/2017   VARICOSE VEIN SURGERY Left 2013   in [place];  left leg    The following portions of the patient's history were reviewed and updated as appropriate: allergies, current medications, past family history, past medical history, past social history, past surgical history and problem list.   Health Maintenance:   Last pap     Component Value Date/Time   DIAGPAP  05/11/2023 1009    - Negative for intraepithelial lesion  or malignancy (NILM)   DIAGPAP - Low grade squamous intraepithelial lesion (LSIL) (A) 03/24/2022 1447   HPVHIGH Negative 05/11/2023 1009   HPVHIGH Negative 03/24/2022 1447   ADEQPAP  05/11/2023 1009    Satisfactory for evaluation; transformation zone component ABSENT.   ADEQPAP  03/24/2022 1447    Satisfactory for evaluation; transformation zone component ABSENT.    High Risk HPV: Positive  Adequacy:  Satisfactory for evaluation, transformation zone component PRESENT  Diagnosis:  Atypical squamous cells of undetermined significance (ASC-US)  Last mammogram: 11/2022 BIRADS  1   Review of Systems:  Pertinent items are noted in HPI. Comprehensive review of systems was otherwise negative.   Objective:  Physical Exam BP (!) 110/54   Pulse 63   Wt 187 lb (84.8 kg)   LMP 08/31/2020 (Exact Date)   BMI 35.33 kg/m    NAD Normal respiratory effort       Assessment & Plan:   1. Postmenopausal vaginal bleeding (Primary) 2. History of endometrial ablation Continues to have intermittently heavy bleeding and she would like to proceed with definitive surgical management. Will resubmit surgical scheduling request for RA-TLH, BS, cysto & LOA.  - Ambulatory Referral For Surgery Scheduling  Patient desires surgical management with RA-TLH, BS, cysto.  The risks of surgery were discussed in detail with the patient including but not limited to: bleeding which may require transfusion or reoperation; infection which may require prolonged hospitalization or re-hospitalization and antibiotic therapy; injury to bowel, bladder, ureters and major vessels or other surrounding organs which may lead to other procedures; formation of adhesions; need for additional procedures including laparotomy or subsequent procedures secondary to intraoperative injury or abnormal pathology; thromboembolic phenomenon; incisional problems and other postoperative or anesthesia complications.  Patient was told that the likelihood that her condition and symptoms will be treated effectively with this surgical management was high; the postoperative expectations were also discussed in detail. The patient also understands the alternative treatment options which were discussed in full. All questions were answered.  She was told that she will be contacted by our surgical scheduler regarding the time and date of her surgery; routine preoperative instructions will be given to her by the preoperative nursing team.    Treatment plan: 01/20/2023: Ultrasound extremities: No evidence of DVT Hypercoagulability W/U:  Factor 8: 236%, Prot C, S, AT3, Factor 5 Leiden Neg, PT gene mut: Neg, Lupus Anticoag: Neg   She can proceed with hysterectomy with.  Early ambulation and elastic compression stockings to prevent blood clot  Elevated ca  Printed patient education handouts about the procedure were given to the patient to review at home.  Routine preventative health maintenance measures emphasized.  Lorriane Shire, MD Minimally Invasive Gynecologic Surgery Center for [facility], [facility]

## 2023-07-02 ENCOUNTER — Telehealth: Payer: Self-pay | Admitting: Lactation Services

## 2023-07-02 NOTE — Telephone Encounter (Signed)
-----   Message from [place] sent at 06/30/2023 11:53 AM EST ----- Regarding: Medication Hello,  Notify that aygestin can't be prescribed due to history of DVT. Would also like to get updated blood work for surgery, orders placed.  Best,  Dr. Briscoe Deutscher

## 2023-07-02 NOTE — Telephone Encounter (Signed)
Called patient with assistance of in house interpreter Ana Alvarez-Pevida.   Patient did not answer. LM for patient to call the office at her convenience to discuss a Medication and a needed appointment.

## 2023-07-02 NOTE — Telephone Encounter (Signed)
Patient returned call to office.   Return call to patient with Gunnar Fusi, in house Spanish interpreter.   Patient informed that Aygestin cannot be prescribed since she has history of DVT.   Reviewed she needs lab work prior to surgery. Scheduled for patient. Patient informed she will be called for any concerns with lab work.   Patient voiced understanding.

## 2023-07-05 ENCOUNTER — Other Ambulatory Visit: Payer: Self-pay

## 2023-07-05 DIAGNOSIS — N950 Postmenopausal bleeding: Secondary | ICD-10-CM

## 2023-07-06 LAB — HEMOGLOBIN A1C
Est. average glucose Bld gHb Est-mCnc: 126 mg/dL
Hgb A1c MFr Bld: 6 % — ABNORMAL HIGH (ref 4.8–5.6)

## 2023-07-06 LAB — CBC
Hematocrit: 44 % (ref 34.0–46.6)
Hemoglobin: 14.4 g/dL (ref 11.1–15.9)
MCH: 30.8 pg (ref 26.6–33.0)
MCHC: 32.7 g/dL (ref 31.5–35.7)
MCV: 94 fL (ref 79–97)
Platelets: 245 10*3/uL (ref 150–450)
RBC: 4.68 x10E6/uL (ref 3.77–5.28)
RDW: 13.2 % (ref 11.7–15.4)
WBC: 5.5 10*3/uL (ref 3.4–10.8)

## 2023-07-06 LAB — TSH RFX ON ABNORMAL TO FREE T4: TSH: 1.06 u[IU]/mL (ref 0.450–4.500)

## 2023-07-08 ENCOUNTER — Telehealth: Payer: Self-pay

## 2023-07-08 NOTE — Telephone Encounter (Addendum)
-----   Message from [place] sent at 07/06/2023  7:14 PM EST ----- Notify that her blood count is normal and her thyroid function is normal. Her A1c is in pre-diabetic range, recommend follow up with primary care and lifestyle modifications   Notified pt of results with Spanish Interpreter Eda R., and that she is a pre-diabetic and recommends that she contacts her PCP ([place] [facility]).  Pt verbalized understanding.  Leonette Nutting  07/08/23

## 2023-07-09 ENCOUNTER — Other Ambulatory Visit (HOSPITAL_BASED_OUTPATIENT_CLINIC_OR_DEPARTMENT_OTHER): Payer: Self-pay

## 2023-07-09 ENCOUNTER — Ambulatory Visit (INDEPENDENT_AMBULATORY_CARE_PROVIDER_SITE_OTHER): Payer: Self-pay | Admitting: Medical

## 2023-07-09 VITALS — BP 100/60 | HR 90 | Temp 98.0°F | Resp 18 | Ht 61.0 in | Wt 188.0 lb

## 2023-07-09 DIAGNOSIS — R739 Hyperglycemia, unspecified: Secondary | ICD-10-CM

## 2023-07-09 MED ORDER — METFORMIN HCL 500 MG PO TABS: 500.0000 mg | ORAL_TABLET | Freq: Every day | ORAL | 3 refills | Status: DC

## 2023-07-09 MED ORDER — METFORMIN HCL 500 MG PO TABS
500.0000 mg | ORAL_TABLET | Freq: Every day | ORAL | 3 refills | Status: DC
  Filled 2023-07-09: qty 90, 90d supply, fill #0

## 2023-07-09 MED ORDER — METFORMIN HCL 500 MG PO TABS
500.0000 mg | ORAL_TABLET | Freq: Two times a day (BID) | ORAL | 3 refills | Status: DC
  Filled 2023-07-09: qty 90, 45d supply, fill #0

## 2023-07-09 NOTE — Patient Instructions (Signed)
Prediabetes HbA1c of 126, increased from 118 a year ago. Discussed the importance of diet and exercise. Family history of diabetes. Discussed the option of starting Metformin to control blood sugar levels and potentially aid in weight loss. -Order metabolic panel to check kidney and liver function before starting Metformin. -If metabolic panel results are normal, start Metformin 500mg  once daily. -Check HbA1c in 3 months to monitor blood sugar control.  Upcoming Hysterectomy Discussed the importance of blood sugar control for surgical healing. Assessed that current prediabetes status should not affect surgical outcome. -Continue current management plan for prediabetes.   Follow up in 3 months or sooner if needed

## 2023-07-09 NOTE — Progress Notes (Signed)
Subjective:    Patient ID: Taylor Parker, female    DOB: [birth year], 57 y.o.   MRN: [id number]  HPI  Discussed the use of AI scribe software for clinical note transcription with the patient, who gave verbal consent to proceed.  History of Present Illness   The patient, with a history of prediabetes, was referred by her gynecologist for a consultation due to an elevated average blood sugar level of 126. She reported that her blood sugar level was previously lower, around 118, a year ago. The patient admitted to a somewhat healthy diet and minimal exercise. She has no history of taking medication for diabetes. She reported a family history of diabetes, with her mother recently diagnosed within the past year.  The patient is scheduled for a hysterectomy, and the gynecologist wanted to discuss diabetes management prior to surgery.       Review of Systems  Constitutional:  Negative for chills, fatigue and fever.  HENT:  Negative for congestion and drooling.   Respiratory:  Negative for cough, chest tightness and wheezing.   Cardiovascular:  Negative for chest pain and palpitations.  Gastrointestinal:  Negative for abdominal pain, diarrhea and nausea.  Musculoskeletal:  Negative for back pain and joint swelling.  Neurological:  Negative for dizziness, seizures and light-headedness.  Psychiatric/Behavioral:  Negative for behavioral problems.     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;   hx ablation left GSV 2013 in [place];   left small SV laser ablation @[facility]  06-09-2017   Wears glasses      Social History   Socioeconomic History   Marital status: Married    Spouse name: Not on file   Number of children: 3   Years of education: Not on file   Highest education level: 6th grade  Occupational History   Occupation: [employer]  7pm-4 am  Tobacco Use   Smoking status: Never   Smokeless tobacco: Never  Vaping Use   Vaping status: Never Used  Substance and Sexual Activity   Alcohol use: Yes    Comment: occ   Drug use: Never   Sexual activity: Yes    Partners: Male    Birth control/protection: Surgical    Comment: 1st intercourse- 18, partners- 2, married- 15 yrs   Other Topics Concern   Not on file  Social History Narrative   Original from [place]   [place]: pt, husband, son and G-son       Social Drivers of Health   Financial Resource Strain: Patient Declined (07/08/2023)   Overall Financial Resource Strain (CARDIA)    Difficulty of Paying Living Expenses: Patient declined  Food Insecurity: Patient Declined (07/08/2023)   Hunger Vital Sign  Worried About Programme researcher, broadcasting/film/video in the Last Year: Patient declined    Barista in the Last Year: Patient declined  Transportation Needs: Patient Declined (07/08/2023)   PRAPARE - Administrator, Civil Service (Medical): Patient declined    Lack of Transportation (Non-Medical): Patient declined  Physical Activity: Unknown (12/22/2022)   Exercise Vital Sign    Days of Exercise per Week: 0 days    Minutes of Exercise per Session: Not on file  Stress: Stress Concern Present (12/22/2022)   [employer] of Occupational Health - Occupational Stress Questionnaire    Feeling of Stress : To some extent  Social Connections: Socially Isolated (07/08/2023)    Social Connection and Isolation Panel [NHANES]    Frequency of Communication with Friends and Family: Never    Frequency of Social Gatherings with Friends and Family: Never    Attends Religious Services: Never    Database administrator or Organizations: No    Attends Engineer, structural: Not on file    Marital Status: Married  Catering manager Violence: Not on file    Past Surgical History:  Procedure Laterality Date   BLADDER SURGERY  12/09/2016   per pt sling   BREAST BIOPSY  06/19/2011   Procedure: BREAST BIOPSY;  Surgeon: Emelia Loron, MD;  Location: [facility];  Service: General;  Laterality: Right;  Right breast chronic abscess drainage and debridement   BREAST DUCTAL SYSTEM EXCISION Left 01/24/2014   Procedure:  LEFT BREAST DUCT EXCISION;  Surgeon: Emelia Loron, MD;  Location: [facility];  Service: General;  Laterality: Left;   CESAREAN SECTION Bilateral 03/15/2003   x3  first two in [place];  last one @[facility]  ;   WITH BILATERAL TUBAL LIGATION   COLONOSCOPY  08/15/2021   dr Gwendalyn Ege   DILATATION & CURETTAGE/HYSTEROSCOPY WITH MYOSURE N/A 05/10/2014   Procedure: DILATATION & CURETTAGE/HYSTEROSCOPY WITH MYOSURE ABLATION,RESECTOSCOPIC POLYPECTOMY;  Surgeon: Ok Edwards, MD;  Location: [facility];  Service: Gynecology;  Laterality: N/A;  Confirmed with [employer] rep to be present.   DILITATION & CURRETTAGE/HYSTROSCOPY WITH HYDROTHERMAL ABLATION N/A 06/16/2022   Procedure: DILATATION & CURETTAGE/HYSTEROSCOPY WITH HYDROTHERMAL ABLATION;  Surgeon: Catalina Antigua, MD;  Location: [facility];  Service: Gynecology;  Laterality: N/A;   INCISE AND DRAIN ABCESS Right 03/05/2011   @[facility]   by dr Dwain Sarna;   I&D right breast abscess and incisional bx   IR EMBO VENOUS NOT HEMORR HEMANG  INC GUIDE ROADMAPPING  06/09/2017   IR RADIOLOGIST EVAL & MGMT  05/04/2017   IR RADIOLOGIST EVAL & MGMT  06/29/2017   VARICOSE VEIN SURGERY Left 2013   in [place];  left leg     Family History  Problem Relation Age of Onset   Heart disease Mother    Diabetes Other        gm   High Cholesterol Brother        high TG   Colon cancer Neg Hx    Breast cancer Neg Hx    Esophageal cancer Neg Hx    Stomach cancer Neg Hx    Rectal cancer Neg Hx     No Known Allergies  Current Outpatient Medications on File Prior to Visit  Medication Sig Dispense Refill   gabapentin (NEURONTIN) 100 MG capsule Take 2 capsules (200 mg total) by mouth at bedtime. 180 capsule 0   No current facility-administered medications on file prior to visit.    BP 100/60   Pulse  90   Temp 98 F (36.7 C)   Resp 18   Ht 5\' 1"  (1.549 m)   Wt 188 lb (85.3 kg)   LMP 08/31/2020 (Exact Date)   SpO2 97%   BMI 35.52 kg/m        Objective:   Physical Exam  General Mental Status- Alert. General Appearance- Not in acute distress.   Skin General: Color- Normal Color. Moisture- Normal Moisture.  Neck Carotid Arteries- Normal color. Moisture- Normal Moisture. No carotid bruits. No JVD.  Chest and Lung Exam Auscultation: Breath Sounds:-CTA  Cardiovascular Auscultation:Rythm- RRR Murmurs & Other Heart Sounds:Auscultation of the heart reveals- No Murmurs.   Neurologic Cranial Nerve exam:- CN III-XII intact(No nystagmus), symmetric smile. Strength:- 5/5 equal and symmetric strength both upper and lower extremities.       Assessment & Plan:   Assessment and Plan    Prediabetes HbA1c of 126, increased from 118 a year ago. Discussed the importance of diet and exercise. Family history of diabetes. Discussed the option of starting Metformin to control blood sugar levels and potentially aid in weight loss. -Order metabolic panel to check kidney and liver function before starting Metformin. -If metabolic panel results are normal, start Metformin 500mg  once daily. -Check HbA1c in 3 months to monitor blood sugar control.  Upcoming Hysterectomy Discussed the importance of blood  sugar control for surgical healing. Assessed that current prediabetes status should not affect surgical outcome. -Continue current management plan for prediabetes.   Follow up in 3 months or sooner if needed        [employer], PA-C

## 2023-07-09 NOTE — Addendum Note (Signed)
Addended by: Maximino Sarin on: 07/09/2023 03:26 PM   Modules accepted: Orders

## 2023-07-10 LAB — COMPREHENSIVE METABOLIC PANEL
AG Ratio: 1.6 (calc) (ref 1.0–2.5)
ALT: 14 U/L (ref 6–29)
AST: 14 U/L (ref 10–35)
Albumin: 3.9 g/dL (ref 3.6–5.1)
Alkaline phosphatase (APISO): 77 U/L (ref 37–153)
BUN: 13 mg/dL (ref 7–25)
CO2: 27 mmol/L (ref 20–32)
Calcium: 8.8 mg/dL (ref 8.6–10.4)
Chloride: 109 mmol/L (ref 98–110)
Creat: 0.93 mg/dL (ref 0.50–1.03)
Globulin: 2.5 g/dL (ref 1.9–3.7)
Glucose, Bld: 104 mg/dL — ABNORMAL HIGH (ref 65–99)
Potassium: 4.3 mmol/L (ref 3.5–5.3)
Sodium: 142 mmol/L (ref 135–146)
Total Bilirubin: 0.3 mg/dL (ref 0.2–1.2)
Total Protein: 6.4 g/dL (ref 6.1–8.1)

## 2023-07-12 ENCOUNTER — Telehealth: Payer: Self-pay

## 2023-07-12 NOTE — Telephone Encounter (Signed)
Use interpretor services to call [name] ID #[id number]). Patient states she is still self pay and does not have the funds to pay the self pay balance. I explained the financial assistance program and agreed to send the patient an application. Patient prefers to pick the application up from Dr. Odie Sera office. I will send patient another estimate of surgery and patient will call back after she completes the FA application. Provided patient w/ my direct number, [phone].

## 2023-07-19 ENCOUNTER — Ambulatory Visit: Payer: Self-pay | Admitting: Plastic Surgery

## 2023-08-04 NOTE — Progress Notes (Signed)
 Office Visit Note  Patient: Taylor Parker             Date of Birth: [birth year]           MRN: [id number]             PCP: Dorina Dallas RIGGERS Referring: Dorina Dallas RIGGERS Visit Date: 08/18/2023 Occupation: @GUAROCC @  Interpreter: Cindie Dubois  Subjective:  Pain in multiple joints and positive ANA  History of Present Illness: Taylor Parker is a 58 y.o. female with history of osteoarthritis and positive ANA.  She returns today after her last visit in 2018.  She was referred by her PCP for the evaluation of positive ANA and joint pain again.  Patient symptoms started in 2014 at the time she had knee joint aspiration and cortisone injection.  She continued to have knee joint pain and wrist joint pain and was evaluated by me in 2018.  At the time she had right knee joint effusion which could not be aspirated but the knee joint was injected with cortisone.  We obtained labs at that visit which were all negative except for positive ANA. Patient states she continues to have pain and discomfort in almost all of her joints.  She describes discomfort in her neck, upper back, lower back, shoulders, elbows, wrist and her hands.  She also has discomfort in her hips and knees.  She notices swelling in her right knee joint.  She had a pelvic fracture at age 67 and has discomfort in her pelvic region since then.   She was evaluated by Dr. Arthea Sharps on June 16, 2023 at the time she had ultrasound-guided left acromioclavicular joint injection.  She also had right knee joint injection by Dr. Arthea Sharps on January 27, 2023.   She denies any history of oral ulcers, nasal ulcers, sicca symptoms, malar rash, Raynaud's phenomenon.  She gives history of photosensitivity.  There is no history of lymphadenopathy.  There is no family history of autoimmune disease.  She is gravida 3, para 3.  She is currently unemployed.  She states she likes to keep her house clean and enjoys  cooking.  She denies doing any exercise on a regular basis.    Activities of Daily Living:  Patient reports morning stiffness for 10 minutes.   Patient Denies nocturnal pain.  Difficulty dressing/grooming: Denies Difficulty climbing stairs: Reports Difficulty getting out of chair: Reports Difficulty using hands for taps, buttons, cutlery, and/or writing: Denies  Review of Systems  Constitutional:  Positive for fatigue.  HENT:  Negative for mouth sores and mouth dryness.   Eyes:  Negative for dryness.  Respiratory:  Negative for shortness of breath.   Cardiovascular:  Negative for chest pain and palpitations.  Gastrointestinal:  Positive for constipation. Negative for blood in stool and diarrhea.  Endocrine: Negative for increased urination.  Genitourinary:  Negative for involuntary urination.  Musculoskeletal:  Positive for joint pain, gait problem, joint pain, joint swelling, myalgias, muscle weakness, morning stiffness, muscle tenderness and myalgias.  Skin:  Positive for hair loss and sensitivity to sunlight. Negative for color change and rash.  Allergic/Immunologic: Negative for susceptible to infections.  Neurological:  Negative for dizziness and headaches.  Hematological:  Negative for swollen glands.  Psychiatric/Behavioral:  Negative for depressed mood and sleep disturbance. The patient is not nervous/anxious.     PMFS History:  Patient Active Problem List   Diagnosis Date Noted   AC (acromioclavicular) arthritis 06/16/2023   Bursitis of left foot  03/10/2023   Patellofemoral arthritis of right knee 12/11/2022   Lumbar radiculopathy 12/11/2022   Postmenopausal vaginal bleeding 06/16/2022   NAFLD (nonalcoholic fatty liver disease) 93/88/7976   Osteopenia 12/21/2021   Colon polyps 12/21/2021   Prediabetes 12/04/2021   Positive screening for depression on 9-item Patient Health Questionnaire (PHQ-9) 12/04/2021   Encounter to establish care 12/04/2021   Osteoarthritis  05/11/2017   Personal history of DVT (deep vein thrombosis) 05/19/2016   Varicose veins with pain 04/25/2014   Chronic venous insufficiency 04/25/2014   Hyperlipidemia 09/03/2011    Past Medical History:  Diagnosis Date   Anxiety    Bilateral low back pain with bilateral sciatica    Bursitis of hip    Headache(784.0)    otc med prn   History of abnormal cervical Pap smear    History of DVT of lower extremity 10/2002   4 months preg. , LLE nonocclusive popliteal vein,   treated with coumadin,   History of kidney stones    Hyperlipidemia    Melasma 12/22/2022   OA (osteoarthritis)    hands,  knees   PMB (postmenopausal bleeding)    PMDD (premenstrual dysphoric disorder)    PONV (postoperative nausea and vomiting)    Pre-diabetes    Primary osteoarthritis of right knee 12/28/2019   Rosacea 12/22/2022   Varicose vein of leg    w/  vensous insuff;   hx ablation left GSV 2013 in [place];   left small SV laser ablation @[facility]  06-09-2017   Wears glasses     Family History  Problem Relation Age of Onset   Diabetes Mother    Heart disease Mother        pacemaker   Kidney failure Mother    High Cholesterol Brother        high TG   Healthy Daughter    Healthy Son    High Cholesterol Son    Healthy Son    Diabetes Other        gm   Colon cancer Neg Hx    Breast cancer Neg Hx    Esophageal cancer Neg Hx    Stomach cancer Neg Hx    Rectal cancer Neg Hx    Past Surgical History:  Procedure Laterality Date   BLADDER SURGERY  12/09/2016   per pt sling   BREAST BIOPSY  06/19/2011   Procedure: BREAST BIOPSY;  Surgeon: Donnice Bury, MD;  Location: [facility];  Service: General;  Laterality: Right;  Right breast chronic abscess drainage and debridement   BREAST DUCTAL SYSTEM EXCISION Left 01/24/2014   Procedure:  LEFT BREAST DUCT EXCISION;  Surgeon: Donnice Bury, MD;  Location: [facility];  Service: General;  Laterality: Left;   CESAREAN SECTION Bilateral  03/15/2003   x3  first two in [place];  last one @[facility]  ;   WITH BILATERAL TUBAL LIGATION   COLONOSCOPY  08/15/2021   dr ernesto   DILATATION & CURETTAGE/HYSTEROSCOPY WITH MYOSURE N/A 05/10/2014   Procedure: DILATATION & CURETTAGE/HYSTEROSCOPY WITH MYOSURE ABLATION,RESECTOSCOPIC POLYPECTOMY;  Surgeon: Curlee VEAR Guan, MD;  Location: [facility];  Service: Gynecology;  Laterality: N/A;  Confirmed with [employer] rep to be present.   DILITATION & CURRETTAGE/HYSTROSCOPY WITH HYDROTHERMAL ABLATION N/A 06/16/2022   Procedure: DILATATION & CURETTAGE/HYSTEROSCOPY WITH HYDROTHERMAL ABLATION;  Surgeon: Alger Gong, MD;  Location: [facility];  Service: Gynecology;  Laterality: N/A;   INCISE AND DRAIN ABCESS Right 03/05/2011   @[facility]   by dr bury;   I&D right breast  abscess and incisional bx   IR EMBO VENOUS NOT HEMORR HEMANG  INC GUIDE ROADMAPPING  06/09/2017   IR RADIOLOGIST EVAL & MGMT  05/04/2017   IR RADIOLOGIST EVAL & MGMT  06/29/2017   uterine biopsy  2023   VARICOSE VEIN SURGERY Bilateral 2013   in [place]   Social History   Social History Narrative   Original from [place]   [place]: pt, husband, son and G-son       Immunization History  Administered Date(s) Administered   Influenza, Seasonal, Injecte, Preservative Fre 04/21/2023   Influenza,inj,Quad PF,6+ Mos 05/08/2013, 03/26/2014, 05/02/2015, 06/18/2017, 08/09/2020   PFIZER(Purple Top)SARS-COV-2 Vaccination 12/12/2019, 02/08/2020   Tdap 06/18/2017     Objective: Vital Signs: BP 116/76 (BP Location: Right Arm, Patient Position: Sitting, Cuff Size: Normal)   Pulse (!) 56   Resp 15   Ht 5' 1.5 (1.562 m)   Wt 185 lb 9.6 oz (84.2 kg)   LMP 08/31/2020 (Exact Date)   BMI 34.50 kg/m    Physical Exam Vitals and nursing note reviewed.  Constitutional:      Appearance: She is well-developed.  HENT:     Head: Normocephalic and atraumatic.  Eyes:     Conjunctiva/sclera: Conjunctivae normal.  Cardiovascular:     Rate and  Rhythm: Normal rate and regular rhythm.     Heart sounds: Normal heart sounds.  Pulmonary:     Effort: Pulmonary effort is normal.     Breath sounds: Normal breath sounds.  Abdominal:     General: Bowel sounds are normal.     Palpations: Abdomen is soft.  Musculoskeletal:     Cervical back: Normal range of motion.  Lymphadenopathy:     Cervical: No cervical adenopathy.  Skin:    General: Skin is warm and dry.     Capillary Refill: Capillary refill takes less than 2 seconds.  Neurological:     Mental Status: She is alert and oriented to person, place, and time.  Psychiatric:        Behavior: Behavior normal.      Musculoskeletal Exam: She had good range of motion of the cervical, thoracic and lumbar spine.  There was no point tenderness over thoracic or lumbar spine.  Shoulder joints were in good range of motion.  She had some discomfort over the right acromioclavicular joint.  Elbow joints were in good range of motion with no synovitis.  Wrist joints were in good range of motion with no synovitis.  No MCP or PIP swelling was noted.  Bilateral DIP thickening was noted.  Hip joints and knee joints in good range of motion.  She is swelling of the right knee joint with some warmth.  Left knee joint was in good range of motion without any warmth swelling or effusion.  There was no tenderness over ankles or MTPs.  CDAI Exam: CDAI Score: -- Patient Global: --; Provider Global: -- Swollen: --; Tender: -- Joint Exam 08/18/2023   No joint exam has been documented for this visit   There is currently no information documented on the homunculus. Go to the Rheumatology activity and complete the homunculus joint exam.  Investigation: No additional findings.  Imaging: XR Hand 2 View Left Result Date: 08/18/2023 [facility], PIP and DIP narrowing was noted.  No MCP, intercarpal or radiocarpal joint space narrowing was noted.  No erosive changes were noted. Impression: These findings are suggestive of  osteoarthritis of the hand.  XR Hand 2 View Right Result Date: 08/18/2023 [facility], PIP and  DIP narrowing was noted.  No MCP, intercarpal or radiocarpal joint space narrowing was noted.  No erosive changes were noted. Impression: These findings are suggestive of osteoarthritis of the hand.   Recent Labs: Lab Results  Component Value Date   WBC 5.5 07/05/2023   HGB 14.4 07/05/2023   PLT 245 07/05/2023   NA 142 07/09/2023   K 4.3 07/09/2023   CL 109 07/09/2023   CO2 27 07/09/2023   GLUCOSE 104 (H) 07/09/2023   BUN 13 07/09/2023   CREATININE 0.93 07/09/2023   BILITOT 0.3 07/09/2023   ALKPHOS 75 07/21/2022   AST 14 07/09/2023   ALT 14 07/09/2023   PROT 6.4 07/09/2023   ALBUMIN 3.9 07/21/2022   CALCIUM  8.8 07/09/2023   GFRAA >60 04/04/2017   QFTBGOLD NEGATIVE 04/12/2017     IMPRESSION: Negative lumbar spine MRI.  Electronically Signed   By: Debby Prader M.D.   On: 07/10/2023 12:04  IMPRESSION: Mild tricompartmental degenerative changes.  Electronically Signed   By: Alm Pouch III M.D.   On: 12/11/2022 14:01   COMPARISON:  None Available. FINDINGS:  Deformity of the superior left SI joint and adjacent iliac wing have  a remote appearance. The left superior pubic ramus deformity has a ...     March 04, 2023 ANA 1: 1280 nuclear, nucleolar, RF negative, ESR 10, CRP<1.0  01/19/2017 ANA positive with Nucleolar pattern titer 1:640 ,CRP High sensitivity elevated 4.9, and Sed Rate normal, 12   04/12/2017 UA 1+ leukocytes, bacteria negative SPEP low albumin, immunoglobulins normal, TB gold negative, hepatitis panel negative G6PD normal CK 63, TSH normal, C3-C4 normal, ENA negative, RF negative, anti-CCP negative, HLA-B27 negative, uric acid 5.4   Speciality Comments: OK to work in appt w/Dr. Dolphus if patient has flare.  Procedures:  No procedures performed Allergies: Patient has no known allergies.   Assessment / Plan:     Visit Diagnoses: Positive ANA  (antinuclear antibody)-patient has had positive ANA for many years.  Her ANA was initially positive in 2018.  She did not have any clinical features of systemic lupus in the past.  She denies any history of oral ulcers, nasal ulcers, sicca symptoms, malar rash, Raynaud's phenomenon, lymphadenopathy.  She gives history of inflammatory arthritis and photosensitivity.  I will check ANA, ENA panel, C3-C4 and urine protein creatinine ratio today.  Arthritis of both acromioclavicular joints-patient has had discomfort in her right shoulder over the acromioclavicular joint.  She has had cortisone injections by Dr. Claudene in the past which was helpful and the pain is coming back.  She may benefit from physical therapy.  Pain in both hands -she complains of a stiffness in her hands.  She denies any history of joint swelling.  Bilateral DIP thickening was noted.  I will obtain x-rays today.  A handout on hand exercises and joint protection was given.  Plan: XR Hand 2 View Right, XR Hand 2 View Left.  X-rays of bilateral hands were suggestive of osteoarthritis.  Chronic pain of right knee-she has had pain and discomfort in her right knee for many years.  She has had cortisone injections in the past and also had PRP by Dr. Claudene.  She continues to have some swelling in her right knee.  No effusion was noted on the examination today.  I personally reviewed the x-rays of the right knee joint from May 2024 were reviewed which showed mild osteoarthritic changes.  A handout on lower extremity exercises was given.  She may benefit  from MRI of the knee joint if she has persistent problems.  A handout on lower extremity exercises was given.  Patellofemoral arthritis of right knee  Bursitis of left foot-resolved  Lumbar radiculopathy-she complains of intermittent lower back pain.  I reviewed x-rays and MRI of her lumbar spine from July 10, 2023 which were unremarkable.  Osteopenia of multiple sites-T-score was -1.4 AP  spine On September 08, 2021.  Other medical problems are listed as follows:  Prediabetes  Mixed hyperlipidemia  NAFLD (nonalcoholic fatty liver disease)  Polyp of colon, unspecified part of colon, unspecified type  Personal history of DVT (deep vein thrombosis)  Chronic venous insufficiency  Varicose veins with pain  Language barrier-interpreter was present throughout the office visit.  Orders: Orders Placed This Encounter  Procedures   XR Hand 2 View Right   XR Hand 2 View Left   RNP Antibody   Anti-Smith antibody   Sjogrens syndrome-A extractable nuclear antibody   Sjogrens syndrome-B extractable nuclear antibody   Anti-scleroderma antibody   Anti-DNA antibody, double-stranded   ANA   Protein / creatinine ratio, urine   C3 and C4   Anti-ribonucleic acid antibody   No orders of the defined types were placed in this encounter.    Follow-Up Instructions: Return for polyarthralgia, +ANA.   Maya Nash, MD  Note - This record has been created using Animal nutritionist.  Chart creation errors have been sought, but may not always  have been located. Such creation errors do not reflect on  the standard of medical care.

## 2023-08-05 ENCOUNTER — Ambulatory Visit: Payer: Self-pay | Admitting: Family Medicine

## 2023-08-18 ENCOUNTER — Encounter: Payer: Self-pay | Admitting: Rheumatology

## 2023-08-18 ENCOUNTER — Telehealth: Payer: Self-pay

## 2023-08-18 ENCOUNTER — Ambulatory Visit: Payer: Self-pay

## 2023-08-18 ENCOUNTER — Ambulatory Visit (INDEPENDENT_AMBULATORY_CARE_PROVIDER_SITE_OTHER): Payer: Self-pay

## 2023-08-18 ENCOUNTER — Ambulatory Visit: Payer: Self-pay | Attending: Rheumatology | Admitting: Rheumatology

## 2023-08-18 ENCOUNTER — Other Ambulatory Visit (HOSPITAL_COMMUNITY)
Admission: EM | Admit: 2023-08-18 | Discharge: 2023-08-18 | Disposition: A | Discharge status: Home / Self Care | Payer: Self-pay | Source: Ambulatory Visit | Attending: Rheumatology | Admitting: Rheumatology

## 2023-08-18 VITALS — BP 116/76 | HR 56 | Resp 15 | Ht 61.5 in | Wt 185.6 lb

## 2023-08-18 DIAGNOSIS — M5416 Radiculopathy, lumbar region: Secondary | ICD-10-CM

## 2023-08-18 DIAGNOSIS — M8589 Other specified disorders of bone density and structure, multiple sites: Secondary | ICD-10-CM

## 2023-08-18 DIAGNOSIS — K635 Polyp of colon: Secondary | ICD-10-CM

## 2023-08-18 DIAGNOSIS — M19012 Primary osteoarthritis, left shoulder: Secondary | ICD-10-CM

## 2023-08-18 DIAGNOSIS — Z603 Acculturation difficulty: Secondary | ICD-10-CM

## 2023-08-18 DIAGNOSIS — M25561 Pain in right knee: Secondary | ICD-10-CM

## 2023-08-18 DIAGNOSIS — M79642 Pain in left hand: Secondary | ICD-10-CM

## 2023-08-18 DIAGNOSIS — M79641 Pain in right hand: Secondary | ICD-10-CM

## 2023-08-18 DIAGNOSIS — M19011 Primary osteoarthritis, right shoulder: Secondary | ICD-10-CM

## 2023-08-18 DIAGNOSIS — I872 Venous insufficiency (chronic) (peripheral): Secondary | ICD-10-CM

## 2023-08-18 DIAGNOSIS — I83819 Varicose veins of unspecified lower extremities with pain: Secondary | ICD-10-CM

## 2023-08-18 DIAGNOSIS — Z86718 Personal history of other venous thrombosis and embolism: Secondary | ICD-10-CM

## 2023-08-18 DIAGNOSIS — G8929 Other chronic pain: Secondary | ICD-10-CM

## 2023-08-18 DIAGNOSIS — E782 Mixed hyperlipidemia: Secondary | ICD-10-CM

## 2023-08-18 DIAGNOSIS — M1711 Unilateral primary osteoarthritis, right knee: Secondary | ICD-10-CM

## 2023-08-18 DIAGNOSIS — R768 Other specified abnormal immunological findings in serum: Secondary | ICD-10-CM

## 2023-08-18 DIAGNOSIS — M7752 Other enthesopathy of left foot: Secondary | ICD-10-CM

## 2023-08-18 DIAGNOSIS — R7303 Prediabetes: Secondary | ICD-10-CM

## 2023-08-18 DIAGNOSIS — K760 Fatty (change of) liver, not elsewhere classified: Secondary | ICD-10-CM

## 2023-08-18 DIAGNOSIS — Z758 Other problems related to medical facilities and other health care: Secondary | ICD-10-CM

## 2023-08-18 NOTE — Patient Instructions (Addendum)
 Ejercicios de [employer] Los ejercicios de manos pueden ser tiles para research officer, trade union. Pueden fortalecer las manos y mejorar el movimiento y la flexibilidad. Los ejercicios tambin pueden aumentar el flujo sanguneo a las manos. Estos resultados [employer] incorporated sea ms fcil encargarse del trabajo y las tareas diarias. Los ejercicios de manos pueden ser especialmente provechosos para la gente que tiene dolor articular provocado por la artritis o tienen dao nervioso por usar las manos continuamente. Estos ejercicios tambin pueden ayudar a las personas que se lesionan [place]. Ejercicios La mayor parte de estos ejercicios de manos son suaves ejercicios de elongacin y [place]. Generalmente es seguro hacerlos durante todo medical laboratory scientific officer. Calentarse las manos antes del ejercicio puede ayudar a reducir la rigidez. Puede hacer esto con un masaje suave o sumergiendo las manos en agua tibia durante 10 a 15 minutos. Es normal sentir science writer, tironeo, opresin o un leve malestar cuando comienza a biochemist, clinical. Con el tiempo, esto mejorar. Recuerde siempre tener cuidado y detenerse de inmediato si siente un dolor repentino y muy intenso o si el dolor empeora. Usted debe mejorar y [employer]. Pregunte al mdico qu ejercicios son seguros para usted. Haga los ejercicios exactamente como se lo haya indicado el mdico y gradelos como se lo hayan indicado. No comience a hacer estos ejercicios hasta que se lo indique el mdico. Flexin de nudillos o puo "garra"  Prese o sintese con el brazo, la mano y los cinco dedos apuntando hacia arriba. Asegrese de fedex en posicin recta. Flexione suavemente los dedos hacia abajo, hacia la palma de la mano, hasta que las puntas de los dedos toquen la palma de la [place]. Mantenga el nudillo grande estirado y solamente flexione los nudillos pequeos en los dedos. Mantenga esta posicin durante 10 segundos. Estire  los dedos nuevamente hasta la posicin inicial. Repita este ejercicio entre 5 y 10 veces con cada mano. Puo con todo el dedo  Prese o sintese con el brazo, la mano y los cinco dedos apuntando hacia arriba. Asegrese de fedex en posicin recta. Flexione suavemente los dedos hacia la palma de la mano, hasta que las puntas de los dedos toquen la parte media de la palma de la [place]. Mantenga esta posicin durante 10 segundos. Extienda los dedos nuevamente hasta la posicin inicial, estirando completamente cada articulacin. Repita este ejercicio entre 5 y 10 veces con cada mano. Puo extendido  Prese o sintese con el brazo, la mano y los cinco dedos apuntando hacia arriba. Asegrese de fedex en posicin recta. Flexione suavemente los dedos en el nudillo grande, donde los dedos se unen a la [place], y en el [place]. Mantenga el nudillo de la punta de los dedos estirado y trate de tocarse la parte inferior de la palma de la [place]. Mantenga esta posicin durante 10 segundos. Extienda los dedos nuevamente hasta la posicin inicial, estirando completamente cada articulacin. Repita este ejercicio entre 5 y 10 veces con cada mano. La mesa  Prese o sintese con el brazo, la mano y los cinco dedos apuntando hacia arriba. Asegrese de fedex en posicin recta. Doble suavemente los dedos en el nudillo ms grande, donde los dedos se unen a la mano, lo ms lejos que pueda hacia abajo. Mantenga estirados los nudillos pequeos de los dedos. Piense en formar una mesa con los dedos. Mantenga esta posicin durante 10 segundos. Extienda los dedos nuevamente hasta la posicin inicial, estirando completamente  cada articulacin. Repita este ejercicio entre 5 y 10 veces con cada mano. Dispersin de dedos  Apoye la mano horizontal sobre una mesa con la palma hacia abajo. Asegrese de que la [place] se mantenga en posicin recta. Separe todos los dedos uno de otro tanto como  pueda hasta sentir un ligero estiramiento. Mantenga esta posicin durante 10 segundos. Vuelva a juntar todos los dedos. Mantenga esta posicin durante 10 segundos. Repita este ejercicio entre 5 y 10 veces con cada mano. Hacer crculos  Prese o sintese con el brazo, la mano y los cinco dedos apuntando hacia arriba. Asegrese de fedex en posicin recta. Haga un crculo tocando la punta del pulgar con la punta del dedo ndice. Mantenga durante 10 segundos. Luego abra la mano completamente. Repita este movimiento con multimedia programmer y cada uno de los otros dedos. Repita este ejercicio entre 5 y 10 veces con cada mano. Movimiento del pulgar  Sintese con el antebrazo apoyado sobre una mesa y la [place]. El pulgar debe apuntar hacia arriba, hacia el techo. Mantenga los otros dedos relajados mientras mueve el pulgar. Levante el pulgar hacia arriba lo ms alto que pueda hacia el techo. Mantenga durante 10 segundos. Doble el pulgar por la palma lo ms lejos que pueda, tratando de llegar con la punta del pulgar hasta el lado del dedo pequeo (meique) de la palma de la [place]. Mantenga durante 10 segundos. Repita este ejercicio entre 5 y 10 veces con cada mano. Fortalecimiento del agarre  Mudlogger pelota para el estrs u otra pelota blanda en el medio de la [place]. Aumente lentamente la presin, apretando la pelota tanto como pueda sin causar dolor. Piense en llevar las puntas de los dedos hacia el centro de la palma. Todas las articulaciones de los dedos deben doblarse al hacer este ejercicio. Mantenga la presin durante 10 segundos y luego relaje. Repita este ejercicio entre 5 y 10 veces con cada mano. Comunquese con un mdico si: El dolor o las molestias en la mano se vuelven mucho ms intensas cuando hace un ejercicio. El dolor o las molestias en la mano no mejoran en el trmino de las 2 horas posteriores a copy. Si tiene alguno de limited brands, deje de owens & minor ejercicios de inmediato. No vuelva a hacerlos a menos que el mdico lo autorice. Solicite ayuda de inmediato si: Presenta hinchazn o dolor sbito e intenso en la mano. Si esto ocurre, deje de museum/gallery exhibitions officer ejercicios de inmediato. No vuelva a hacerlos a menos que el mdico lo autorice. Esta informacin no tiene theme park manager el consejo del mdico. Asegrese de hacerle al mdico cualquier pregunta que tenga. Document Revised: 08/08/2022 Document Reviewed: 08/08/2022 Elsevier Patient Education  2024 Elsevier Inc.Ejercicios para las rodillas   Knee Exercises Pregunte al mdico qu ejercicios son seguros para usted. Haga los ejercicios exactamente como se lo haya indicado el mdico y gradelos como se lo hayan indicado. Es normal sentir un leve estiramiento, tironeo, opresin o dentist al [employer] inc ejercicios. Detngase de inmediato si siente un dolor repentino o community education officer. No comience a hacer estos ejercicios hasta que se lo indique el mdico. Ejercicios de elongacin y amplitud de movimiento Estos ejercicios calientan los msculos y las articulaciones, y mejoran la movilidad y la flexibilidad de la rodilla. Adems, ayudan a aliviar el dolor y la hinchazn. Extensin de la rodilla, en decbito prono  Recustese boca abajo (posicin prona) sobre una cama. Coloque la rodilla izquierda/derecha fuera  de dicha superficie para no apoyarla. Puede colocar una toalla debajo del muslo izquierdo/derecho, justo sobre la [place], para mayor comodidad. Relaje los msculos de la pierna, dejando que la ley de gravedad extienda la rodilla (extensin). Debe sentir un estiramiento detrs de la rodilla izquierda/derecha. Mantenga esta posicin durante __________ segundos. Muvase un poco hacia adelante para apoyar la rodilla entre repeticiones. Repita __________ veces. Realice este ejercicio __________ veces al da. Flexin de la rodilla, activo  Acustese boca arriba con las piernas extendidas. Si  esto le ocasiona molestias en la espalda, flexione la rodilla izquierda/derecha y coloque el pie sobre el suelo. Lentamente, acerque el taln izquierdo/derecho omnicare. Detngase cuando sienta un leve estiramiento en la parte de adelante de la rodilla o del muslo (flexin). Mantenga esta posicin durante __________ segundos. Deslice lentamente el taln izquierdo/derecho hasta la posicin inicial. Repita __________ veces. Realice este ejercicio __________ veces al da. Estiramiento de cudriceps, decbito prono  Recustese boca abajo en una superficie firme, como una cama o un suelo acolchonado. Flexione la rodilla izquierda/derecha y tmese del tobillo. Si no puede llegar al hildy o al pantaln, tese un cinturn alrededor del pie y agrrelo en nature conservation officer. Acerque lentamente el taln a las nalgas. La rodilla no deber national oilwell varco. Debe sentir un estiramiento en la parte delantera del muslo y la rodilla (cudriceps). Mantenga esta posicin durante __________ segundos. Repita __________ veces. Realice este ejercicio __________ veces al da. Isquiotibiales, en decbito supino  Acustese boca arriba (posicin supina). Ate un cinturn o una toalla en la regin metatarsiana del pie izquierdo/derecho. La regin metatarsiana del pie es la superficie sobre la que caminamos, justo debajo de los dedos. Estire la rodilla izquierda/derecha y, lentamente, tire del cinturn para elevar la pierna, hasta que sienta un leve estiramiento detrs de la rodilla (isquiotibiales). No flexione la rodilla mientras realiza este ejercicio. Mantenga la otra pierna estirada contra el suelo. Mantenga esta posicin durante __________ segundos. Repita __________ veces. Realice este ejercicio __________ veces al da. Ejercicios de fortalecimiento Estos ejercicios fortalecen la rodilla y horticulturist, commercial resistencia. La resistencia es la capacidad de usar los msculos durante un tiempo prolongado, incluso despus de  que se cansen. Cudriceps, ejercicio isomtrico En este ejercicio, se estiran los msculos de la parte delantera del muslo (cudriceps) sin mover la articulacin de la rodilla (isomtrico). Recustese boca arriba con la pierna izquierda/derecha extendida y la rodilla opuesta flexionada. Coloque una toalla enrollada o un almohadn pequeo debajo de la rodilla, segn las indicaciones del mdico. Tensione lentamente los msculos de la parte de adelante del muslo izquierdo/derecho. Deber ver la rtula que se desliza para arriba hacia la cadera o que se profundizan los hoyuelos justo por encima de la rodilla. Este ncr corporation la parte posterior de rodilla hacia el suelo. Mantenga el msculo tan apretado como pueda sin aumentar el dolor durante __________ segundos. Relaje los msculos lentamente y por completo. Repita __________ veces. Realice este ejercicio __________ veces al da. Elevaciones con la pierna extendida Este ejercicio fortalece los msculos de la parte de adelante del muslo (cudriceps) y los msculos que permiten mover las caderas (flexores de cadera). Recustese boca arriba con la pierna izquierda/derecha extendida y la rodilla opuesta flexionada. Tensione los msculos de la parte de adelante del muslo izquierdo/derecho. Deber ver la rtula de la rodilla que se desliza hacia arriba o que se profundizan los hoyuelos justo por encima de la rodilla. El muslo podr sacudirse un poco. Mantenga estos msculos contrados mientras levanta  la pierna a una altura de 4 a 6 pulgadas (10 a 15 cm) del suelo. No deje que la rodilla se flexione. Mantenga esta posicin durante __________ segundos. Mantenga estos msculos contrados mientras baja la pierna. Deje que sus msculos se relajen completamente despus de cada repeticin. Repita __________ veces. Realice este ejercicio __________ veces al da. Isquiotibiales, isomtrico  Acustese boca arriba sobre una superficie firme. Flexione la  rodilla izquierda/derecha aproximadamente __________ alfonso. Clave el taln izquierdo/derecho en la superficie, como si estuviera tratando de tirar de l hacia las nalgas. Tense los msculos de la parte posterior de los muslos (isquiotibiales) para presionar tanto como pueda, sin tax inspector. Mantenga esta posicin durante __________ segundos. Libere la tensin poco a poco y deje que sus msculos se relajen completamente durante __________ [place] cada repeticin. Repita __________ veces. Realice este ejercicio __________ veces al da. Isquiotibiales con flexin Realice este ejercicio con pesas en los tobillos si se lo ha indicado el mdico. Comience con pesas de __________ libras/kilogramos. Luego aumente el peso en incrementos de 1 libra (0.5 kg). No use pesas en los tobillos que pesen ms de __________ libras/kilogramos. Recustese boca abajo con las piernas extendidas. Flexione la rodilla izquierda/derecha lo ms que pueda sin financial risk analyst. Mantenga la cadera apoyada contra el suelo. Mantenga esta posicin durante __________ segundos. Baje lentamente la pierna hasta la posicin inicial. Repita __________ veces. Realice este ejercicio __________ veces al da. Cuclillas Este ejercicio fortalece los msculos de la parte de adelante del muslo y la rodilla (cudriceps). Prese enfrente de entergy corporation, con los pies y las rodillas apuntando hacia adelante. Puede colocar las computer sciences corporation la mesa para mantener el equilibrio, pero no para apoyarse. Flexione lentamente las rodillas y baje la cadera, como si fuera a sentarse en una silla. Mantenga el applied materials talones, no united stationers dedos. Mantenga la parte inferior de las piernas rectas, de modo que queden paralelas a las patas de la mesa. No deje que la cadera quede ms abajo que las rodillas. No flexione las rodillas ms de lo indicado por el mdico. Si el dolor en las rodillas aumenta, no las flexione [place]. Mantenga la posicin de  cuclillas durante __________ segundos. Haga fuerza con las piernas para pararse de [place]. No use las manos para pararse. Repita __________ veces. Realice este ejercicio __________ veces al da. Deslizamientos sobre la pared [employer] ejercicio fortalece los msculos de la parte de adelante del muslo y la rodilla (cudriceps). Apoye la espalda contra una pared o una puerta, pero mantenga los pies a una distancia de entre 18 y 24 pulgadas (46 y 61 cm) de la pared o puerta. Separe los pies al ancho de caderas. Deslcese lentamente hacia abajo, contra la pared o la puerta, de modo que sus rodillas queden flexionadas a __________ alfonso. Mantenga las [employer], no sobre la punta de los pies. Mantenga las rodillas alineadas con las caderas. Mantenga esta posicin durante __________ segundos. Repita __________ veces. Realice este ejercicio __________ veces al da. Elevaciones con pierna extendida, recostado de lado Este ejercicio fortalece los msculos que rotan la pierna a la altura de la cadera y la alejan del cuerpo (abductores de la cadera). Recustese de costado con la pierna izquierda/derecha arriba. Recustese de kb home	[place] cabeza, los hombros, la rodilla y la cadera estn alineados. Puede flexionar la rodilla de abajo para mantener el equilibrio. Mueva ligeramente las caderas hacia adelante, de modo que queden alineadas y la rodilla izquierda/derecha quede hacia adelante.  Con el taln como gua, levante la pierna de 4 a 6 pulgadas (10 a 15 cm). Debe sentir que se elevan los msculos de la parte externa de la cadera. No lleve el pie hacia adelante. No gire la rodilla hacia el techo. Mantenga esta posicin durante __________ segundos. Regrese lentamente la pierna a la posicin inicial. Deje que los msculos se relajen completamente despus de cada repeticin. Repita __________ veces. Realice este ejercicio __________ veces al da. Elevaciones con la pierna extendida, en decbito  prono Este ejercicio dole food que extienden las caderas desde la parte anterior de la pelvis (extensores de cadera). Acustese boca abajo sobre una superficie firme. Puede colocar un almohadn debajo de la cadera si le resulta ms cmodo. Tense los msculos de las nalgas para levantar la pierna izquierda/derecha unas 4 a 6 pulgadas (10 a 15 cm). Mantenga la rodilla estirada mientras eleva la pierna. Mantenga esta posicin durante __________ segundos. Baje lentamente la pierna hasta la posicin inicial. Deje que la pierna se relaje completamente despus de cada repeticin. Repita __________ veces. Realice este ejercicio __________ veces al da. Esta informacin no tiene theme park manager el consejo del mdico. Asegrese de hacerle al mdico cualquier pregunta que tenga. Document Revised: 03/27/2021 Document Reviewed: 03/27/2021 Elsevier Patient Education  2024 [employer].

## 2023-08-18 NOTE — Telephone Encounter (Signed)
 Katrina from [facility] called and states they were able to draw all the blood work labs but could not do the urine lab. Dr. Dolphus advised that is okay and the patient can have the urine lab done at the next appointment in March. Katrina verbalized understanding.

## 2023-08-19 LAB — SJOGRENS SYNDROME-A EXTRACTABLE NUCLEAR ANTIBODY: SSA (Ro) (ENA) Antibody, IgG: <0.2 AI (ref 0.0–0.9)

## 2023-08-19 LAB — ANTI-SCLERODERMA ANTIBODY: Scleroderma (Scl-70) (ENA) Antibody, IgG: <0.2 AI (ref 0.0–0.9)

## 2023-08-19 LAB — SJOGRENS SYNDROME-B EXTRACTABLE NUCLEAR ANTIBODY: SSB (La) (ENA) Antibody, IgG: <0.2 AI (ref 0.0–0.9)

## 2023-08-19 LAB — ANTI-DNA ANTIBODY, DOUBLE-STRANDED: ds DNA Ab: <1 [IU]/mL (ref 0–9)

## 2023-08-19 LAB — ANTI-SMITH ANTIBODY: ENA SM Ab Ser-aCnc: <0.2 AI (ref 0.0–0.9)

## 2023-08-19 LAB — ANA: Anti Nuclear Antibody (ANA): NEGATIVE

## 2023-08-20 ENCOUNTER — Other Ambulatory Visit: Payer: Self-pay | Admitting: *Deleted

## 2023-08-20 DIAGNOSIS — R768 Other specified abnormal immunological findings in serum: Secondary | ICD-10-CM

## 2023-08-20 LAB — C3 AND C4
C3 Complement: 164 mg/dL (ref 82–167)
Complement C4, Body Fluid: 30 mg/dL (ref 12–38)

## 2023-08-20 NOTE — Progress Notes (Signed)
 ANA negative, ENA panel negative.  Complements normal.  Labs do not indicate autoimmune disease.

## 2023-08-31 ENCOUNTER — Telehealth: Payer: Self-pay | Admitting: *Deleted

## 2023-08-31 NOTE — Telephone Encounter (Signed)
 Per Dr. Corliss Skains, all labs were normal. Patient can follow up with Dr. Katrinka Blazing for OA. May cancel appointment for 09/14/2023.  Reached out to patient via interpreter [phone]. Patient advised all labs are normal. Advised patient she may follow up with Dr. Katrinka Blazing for OA. Advised patient we can cancl her follow up in our office. Patient is agreeable.

## 2023-08-31 NOTE — Progress Notes (Deleted)
 Office Visit Note  Patient: Taylor Parker             Date of Birth: Dec 28, 1965           MRN: [id number]             PCP: Marisue Brooklyn Referring: Marisue Brooklyn Visit Date: 09/14/2023 Occupation: @GUAROCC @  Subjective:  No chief complaint on file.   History of Present Illness: Taylor Parker is a 58 y.o. female ***     Activities of Daily Living:  Patient reports morning stiffness for *** {minute/hour:19697}.   Patient {ACTIONS;DENIES/REPORTS:21021675::"Denies"} nocturnal pain.  Difficulty dressing/grooming: {ACTIONS;DENIES/REPORTS:21021675::"Denies"} Difficulty climbing stairs: {ACTIONS;DENIES/REPORTS:21021675::"Denies"} Difficulty getting out of chair: {ACTIONS;DENIES/REPORTS:21021675::"Denies"} Difficulty using hands for taps, buttons, cutlery, and/or writing: {ACTIONS;DENIES/REPORTS:21021675::"Denies"}  No Rheumatology ROS completed.   PMFS History:  Patient Active Problem List   Diagnosis Date Noted   AC (acromioclavicular) arthritis 06/16/2023   Bursitis of left foot 03/10/2023   Patellofemoral arthritis of right knee 12/11/2022   Lumbar radiculopathy 12/11/2022   Postmenopausal vaginal bleeding 06/16/2022   NAFLD (nonalcoholic fatty liver disease) 69/62/9528   Osteopenia 12/21/2021   Colon polyps 12/21/2021   Prediabetes 12/04/2021   Positive screening for depression on 9-item Patient Health Questionnaire (PHQ-9) 12/04/2021   Encounter to establish care 12/04/2021   Osteoarthritis 05/11/2017   Personal history of DVT (deep vein thrombosis) 05/19/2016   Varicose veins with pain 04/25/2014   Chronic venous insufficiency 04/25/2014   Hyperlipidemia 09/03/2011    Past Medical History:  Diagnosis Date   Anxiety    Bilateral low back pain with bilateral sciatica    Bursitis of hip    Headache(784.0)    otc med prn   History of abnormal cervical Pap smear    History of DVT of lower extremity 10/2002   4 months preg. , LLE  nonocclusive popliteal vein,   treated with coumadin,   History of kidney stones    Hyperlipidemia    Melasma 12/22/2022   OA (osteoarthritis)    hands,  knees   PMB (postmenopausal bleeding)    PMDD (premenstrual dysphoric disorder)    PONV (postoperative nausea and vomiting)    Pre-diabetes    Primary osteoarthritis of right knee 12/28/2019   Rosacea 12/22/2022   Varicose vein of leg    w/  vensous insuff;   hx ablation left GSV 2013 in [place];   left small SV laser ablation @[facility]  06-09-2017   Wears glasses     Family History  Problem Relation Age of Onset   Diabetes Mother    Heart disease Mother        pacemaker   Kidney failure Mother    High Cholesterol Brother        high TG   Healthy Daughter    Healthy Son    High Cholesterol Son    Healthy Son    Diabetes Other        gm   Colon cancer Neg Hx    Breast cancer Neg Hx    Esophageal cancer Neg Hx    Stomach cancer Neg Hx    Rectal cancer Neg Hx    Past Surgical History:  Procedure Laterality Date   BLADDER SURGERY  12/09/2016   per pt sling   BREAST BIOPSY  06/19/2011   Procedure: BREAST BIOPSY;  Surgeon: Emelia Loron, MD;  Location: [facility];  Service: General;  Laterality: Right;  Right breast chronic abscess drainage and debridement   BREAST  DUCTAL SYSTEM EXCISION Left 01/24/2014   Procedure:  LEFT BREAST DUCT EXCISION;  Surgeon: Emelia Loron, MD;  Location: [facility];  Service: General;  Laterality: Left;   CESAREAN SECTION Bilateral 03/15/2003   x3  first two in [place];  last one @[facility]  ;   WITH BILATERAL TUBAL LIGATION   COLONOSCOPY  08/15/2021   dr Gwendalyn Ege   DILATATION & CURETTAGE/HYSTEROSCOPY WITH MYOSURE N/A 05/10/2014   Procedure: DILATATION & CURETTAGE/HYSTEROSCOPY WITH MYOSURE ABLATION,RESECTOSCOPIC POLYPECTOMY;  Surgeon: Ok Edwards, MD;  Location: [facility];  Service: Gynecology;  Laterality: N/A;  Confirmed with [employer] rep to be present.   DILITATION & CURRETTAGE/HYSTROSCOPY  WITH HYDROTHERMAL ABLATION N/A 06/16/2022   Procedure: DILATATION & CURETTAGE/HYSTEROSCOPY WITH HYDROTHERMAL ABLATION;  Surgeon: Catalina Antigua, MD;  Location: [facility];  Service: Gynecology;  Laterality: N/A;   INCISE AND DRAIN ABCESS Right 03/05/2011   @[facility]   by dr Dwain Sarna;   I&D right breast abscess and incisional bx   IR EMBO VENOUS NOT HEMORR HEMANG  INC GUIDE ROADMAPPING  06/09/2017   IR RADIOLOGIST EVAL & MGMT  05/04/2017   IR RADIOLOGIST EVAL & MGMT  06/29/2017   uterine biopsy  2023   VARICOSE VEIN SURGERY Bilateral 2013   in [place]   Social History   Social History Narrative   Original from [place]   [place]: pt, husband, son and G-son       Immunization History  Administered Date(s) Administered   Influenza, Seasonal, Injecte, Preservative Fre 04/21/2023   Influenza,inj,Quad PF,6+ Mos 05/08/2013, 03/26/2014, 05/02/2015, 06/18/2017, 08/09/2020   PFIZER(Purple Top)SARS-COV-2 Vaccination 12/12/2019, 02/08/2020   Tdap 06/18/2017     Objective: Vital Signs: LMP 08/31/2020 (Exact Date)    Physical Exam   Musculoskeletal Exam: ***  CDAI Exam: CDAI Score: -- Patient Global: --; Provider Global: -- Swollen: --; Tender: -- Joint Exam 09/14/2023   No joint exam has been documented for this visit   There is currently no information documented on the homunculus. Go to the Rheumatology activity and complete the homunculus joint exam.  Investigation: No additional findings.  Imaging: XR Hand 2 View Left Result Date: 08/18/2023 [facility], PIP and DIP narrowing was noted.  No MCP, intercarpal or radiocarpal joint space narrowing was noted.  No erosive changes were noted. Impression: These findings are suggestive of osteoarthritis of the hand.  XR Hand 2 View Right Result Date: 08/18/2023 [facility], PIP and DIP narrowing was noted.  No MCP, intercarpal or radiocarpal joint space narrowing was noted.  No erosive changes were noted. Impression: These findings are  suggestive of osteoarthritis of the hand.   Recent Labs: Lab Results  Component Value Date   WBC 5.5 07/05/2023   HGB 14.4 07/05/2023   PLT 245 07/05/2023   NA 142 07/09/2023   K 4.3 07/09/2023   CL 109 07/09/2023   CO2 27 07/09/2023   GLUCOSE 104 (H) 07/09/2023   BUN 13 07/09/2023   CREATININE 0.93 07/09/2023   BILITOT 0.3 07/09/2023   ALKPHOS 75 07/21/2022   AST 14 07/09/2023   ALT 14 07/09/2023   PROT 6.4 07/09/2023   ALBUMIN 3.9 07/21/2022   CALCIUM 8.8 07/09/2023   GFRAA >60 04/04/2017   QFTBGOLD NEGATIVE 04/12/2017   August 18, 2023 ANA negative, dsDNA negative, SSA negative, SSB negative, Smith negative, SCL 70 negative, C3-C4 normal  March 04, 2023 ANA 1: 1280 nuclear, nucleolar, RF negative, ESR 10, CRP<1.0   01/19/2017 ANA positive with Nucleolar pattern titer 1:640 ,CRP High sensitivity elevated 4.9, and  Sed Rate normal, 12    04/12/2017 UA 1+ leukocytes, bacteria negative SPEP low albumin, immunoglobulins normal, TB gold negative, hepatitis panel negative G6PD normal CK 63, TSH normal, C3-C4 normal, ENA negative, RF negative, anti-CCP negative, HLA-B27 negative, uric acid 5.4  Speciality Comments: OK to work in appt w/Dr. Corliss Skains if patient has flare.  Procedures:  No procedures performed Allergies: Patient has no known allergies.   Assessment / Plan:     Visit Diagnoses: No diagnosis found.  Orders: No orders of the defined types were placed in this encounter.  No orders of the defined types were placed in this encounter.   Face-to-face time spent with patient was *** minutes. Greater than 50% of time was spent in counseling and coordination of care.  Follow-Up Instructions: No follow-ups on file.   Pollyann Savoy, MD  Note - This record has been created using Animal nutritionist.  Chart creation errors have been sought, but may not always  have been located. Such creation errors do not reflect on  the standard of medical care.

## 2023-09-14 ENCOUNTER — Ambulatory Visit: Payer: Self-pay | Admitting: Rheumatology

## 2023-10-08 ENCOUNTER — Ambulatory Visit: Payer: Self-pay | Admitting: Medical

## 2023-11-09 ENCOUNTER — Other Ambulatory Visit: Payer: Self-pay | Admitting: Obstetrics and Gynecology

## 2023-11-09 DIAGNOSIS — Z1231 Encounter for screening mammogram for malignant neoplasm of breast: Secondary | ICD-10-CM

## 2023-11-12 NOTE — Progress Notes (Unsigned)
 [facility] [address] [place] [address] Phone: [phone] Subjective:   Taylor Parker, am serving as a scribe for Dr. Ronnell Coins.  I'm seeing this patient by the request  of:  Saguier, Slater Duncan  CC: Bilateral shoulder and low back pain  [id number]  06/16/2023 Bilateral injections given today.  Could be some of the underlying autoimmune disease and patient is going to be seeing rheumatology at some point.  Discussed icing regimen and home exercises otherwise.  Discussed strengthening exercises of the upper back.  Gabapentin  prescribed as well to help.  Follow-up again in 6 to 8 weeks     Chronic problem with exacerbation noted.  Discussed icing regimen and home exercises.  Discussed which activities to do things to avoid.  Patient has failed all conservative therapy including gabapentin  100 mg to 200 mg at night, worsening daily activities at the moment as well.  Feel we should consider advanced imaging with this also waking her up at night.  Will order MRI and see if patient is a candidate for potential epidurals or other types of injections.  Follow-up with me after imaging to discuss further.  Total time with patient today 32 minutes secondary to the use of translator and this does not include the procedures of patient's shoulder at the moment.     Update 11/15/2023 Taylor Parker is a 58 y.o. female coming in with complaint of B shoulder and lower back pain. Patient states that she is having pain everywhere. Pain in feet, wrists, back, shoulders. Saw Rheumatology and was told that she did not have any issues that they could identify. Gabapentin  is somewhat helpful.   Pain in L heel and metatarsals.    At last follow-up given injections in both acromioclavicular joints in December.   Patient did have an MRI of the lumbar spine that did not show any significant bony abnormality noted.  Medications include gabapentin .  Past  Medical History:  Diagnosis Date   Anxiety    Bilateral low back pain with bilateral sciatica    Bursitis of hip    Headache(784.0)    otc med prn   History of abnormal cervical Pap smear    History of DVT of lower extremity 10/2002   4 months preg. , LLE nonocclusive popliteal vein,   treated with coumadin,   History of kidney stones    Hyperlipidemia    Melasma 12/22/2022   OA (osteoarthritis)    hands,  knees   PMB (postmenopausal bleeding)    PMDD (premenstrual dysphoric disorder)    PONV (postoperative nausea and vomiting)    Pre-diabetes    Primary osteoarthritis of right knee 12/28/2019   Rosacea 12/22/2022   Varicose vein of leg    w/  vensous insuff;   hx ablation left GSV 2013 in [place];   left small SV laser ablation @[facility]  06-09-2017   Wears glasses    Past Surgical History:  Procedure Laterality Date   BLADDER SURGERY  12/09/2016   per pt sling   BREAST BIOPSY  06/19/2011   Procedure: BREAST BIOPSY;  Surgeon: Enid Harry, MD;  Location: [facility];  Service: General;  Laterality: Right;  Right breast chronic abscess drainage and debridement   BREAST DUCTAL SYSTEM EXCISION Left 01/24/2014   Procedure:  LEFT BREAST DUCT EXCISION;  Surgeon: Enid Harry, MD;  Location: [facility];  Service: General;  Laterality: Left;   CESAREAN SECTION Bilateral 03/15/2003   x3  first two in [place];  last one @[facility]  ;   WITH BILATERAL TUBAL LIGATION   COLONOSCOPY  08/15/2021   dr Ardine Beckwith   DILATATION & CURETTAGE/HYSTEROSCOPY WITH MYOSURE N/A 05/10/2014   Procedure: DILATATION & CURETTAGE/HYSTEROSCOPY WITH MYOSURE ABLATION,RESECTOSCOPIC POLYPECTOMY;  Surgeon: Davia Erps, MD;  Location: [facility];  Service: Gynecology;  Laterality: N/A;  Confirmed with [employer] rep to be present.   DILITATION & CURRETTAGE/HYSTROSCOPY WITH HYDROTHERMAL ABLATION N/A 06/16/2022   Procedure: DILATATION & CURETTAGE/HYSTEROSCOPY WITH HYDROTHERMAL ABLATION;  Surgeon: Verlyn Goad, MD;   Location: [facility];  Service: Gynecology;  Laterality: N/A;   INCISE AND DRAIN ABCESS Right 03/05/2011   @[facility]   by dr Delane Fear;   I&D right breast abscess and incisional bx   IR EMBO VENOUS NOT HEMORR HEMANG  INC GUIDE ROADMAPPING  06/09/2017   IR RADIOLOGIST EVAL & MGMT  05/04/2017   IR RADIOLOGIST EVAL & MGMT  06/29/2017   uterine biopsy  2023   VARICOSE VEIN SURGERY Bilateral 2013   in [place]   Social History   Socioeconomic History   Marital status: Married    Spouse name: Not on file   Number of children: 3   Years of education: Not on file   Highest education level: 6th grade  Occupational History   Occupation: [employer]  7pm-4 am  Tobacco Use   Smoking status: Never    Passive exposure: Never   Smokeless tobacco: Never  Vaping Use   Vaping status: Never Used  Substance and Sexual Activity   Alcohol use: Not Currently   Drug use: Never   Sexual activity: Yes    Partners: Male    Birth control/protection: Surgical    Comment: 1st intercourse- 18, partners- 2, married- 15 yrs   Other Topics Concern   Not on file  Social History Narrative   Original from [place]   [place]: pt, husband, son and G-son       Social Drivers of Health   Financial Resource Strain: Patient Declined (07/08/2023)   Overall Financial Resource Strain (CARDIA)    Difficulty of Paying Living Expenses: Patient declined  Food Insecurity: Patient Declined (07/08/2023)   Hunger Vital Sign    Worried About Running Out of Food in the Last Year: Patient declined    Ran Out of Food in the Last Year: Patient declined  Transportation Needs: Patient Declined (07/08/2023)   PRAPARE - Administrator, Civil Service (Medical): Patient declined    Lack of Transportation (Non-Medical): Patient declined  Physical Activity: Unknown (12/22/2022)   Exercise Vital Sign    Days of Exercise per Week: 0 days    Minutes of Exercise per Session: Not on file  Stress: Stress Concern  Present (12/22/2022)   [employer] of Occupational Health - Occupational Stress Questionnaire    Feeling of Stress : To some extent  Social Connections: Socially Isolated (07/08/2023)   Social Connection and Isolation Panel [NHANES]    Frequency of Communication with Friends and Family: Never    Frequency of Social Gatherings with Friends and Family: Never    Attends Religious Services: Never    Database administrator or Organizations: No    Attends Engineer, structural: Not on file    Marital Status: Married   No Known Allergies Family History  Problem Relation Age of Onset   Diabetes Mother    Heart disease Mother        pacemaker   Kidney failure Mother  High Cholesterol Brother        high TG   Healthy Daughter    Healthy Son    High Cholesterol Son    Healthy Son    Diabetes Other        gm   Colon cancer Neg Hx    Breast cancer Neg Hx    Esophageal cancer Neg Hx    Stomach cancer Neg Hx    Rectal cancer Neg Hx     Current Outpatient Medications (Endocrine & Metabolic):    metFORMIN  (GLUCOPHAGE ) 500 MG tablet, Take 1 tablet (500 mg total) by mouth daily with breakfast.      Current Outpatient Medications (Other):    DULoxetine (CYMBALTA) 20 MG capsule, Take 1 capsule (20 mg total) by mouth daily.   gabapentin  (NEURONTIN ) 100 MG capsule, Take 2 capsules (200 mg total) by mouth at bedtime.   gabapentin  (NEURONTIN ) 300 MG capsule, Take 1 capsule (300 mg total) by mouth at bedtime.   Reviewed prior external information including notes and imaging from  primary care provider As well as notes that were available from care everywhere and other healthcare systems.  Past medical history, social, surgical and family history all reviewed in electronic medical record.  No pertanent information unless stated regarding to the chief complaint.   Review of Systems:  No headache, visual changes, nausea, vomiting, diarrhea, constipation, dizziness, abdominal  pain, skin rash, fevers, chills, night sweats, weight loss, swollen lymph nodes, chest pain, shortness of breath, mood changes. POSITIVE muscle aches, body aches, joint swelling  Objective  Blood pressure 110/72, pulse 66, height 5' 1.5" (1.562 m), weight 185 lb (83.9 kg), last menstrual period 08/31/2020, SpO2 98%.   General: No apparent distress alert and oriented x3 mood and affect normal, dressed appropriately.  HEENT: Pupils equal, extraocular movements intact  Respiratory: Patient's speak in full sentences and does not appear short of breath  Cardiovascular: No lower extremity edema, non tender, no erythema  We are accompanied with a Engineer, structural. Patient is tender to palpation in multiple different areas.  This includes even light palpation to the thigh.  Patient also has severe tenderness in the soft tissues of the lower back noted.  Some difficulty going from a seated to standing position.    Impression and Recommendations:     The above documentation has been reviewed and is accurate and complete Jayelyn Barno M Rozalynn Buege, DO

## 2023-11-15 ENCOUNTER — Ambulatory Visit (INDEPENDENT_AMBULATORY_CARE_PROVIDER_SITE_OTHER): Payer: Self-pay | Admitting: Family Medicine

## 2023-11-15 ENCOUNTER — Other Ambulatory Visit: Payer: Self-pay

## 2023-11-15 VITALS — BP 110/72 | HR 66 | Ht 61.5 in | Wt 185.0 lb

## 2023-11-15 DIAGNOSIS — M25511 Pain in right shoulder: Secondary | ICD-10-CM

## 2023-11-15 DIAGNOSIS — M2550 Pain in unspecified joint: Secondary | ICD-10-CM | POA: Insufficient documentation

## 2023-11-15 MED ORDER — KETOROLAC TROMETHAMINE 60 MG/2ML IM SOLN
60.0000 mg | Freq: Once | INTRAMUSCULAR | Status: AC
  Administered 2023-11-15: 60 mg via INTRAMUSCULAR

## 2023-11-15 MED ORDER — METHYLPREDNISOLONE ACETATE 80 MG/ML IJ SUSP
80.0000 mg | Freq: Once | INTRAMUSCULAR | Status: AC
  Administered 2023-11-15: 80 mg via INTRAMUSCULAR

## 2023-11-15 MED ORDER — DULOXETINE HCL 20 MG PO CPEP: 20.0000 mg | ORAL_CAPSULE | Freq: Every day | ORAL | 0 refills | Status: DC

## 2023-11-15 MED ORDER — GABAPENTIN 300 MG PO CAPS: 300.0000 mg | ORAL_CAPSULE | Freq: Every day | ORAL | 0 refills | Status: DC

## 2023-11-15 NOTE — Assessment & Plan Note (Signed)
 Had workup that did show positive ANA but oncology did not find any other findings of any autoimmune disease.  At this point do need to consider the possibility of a chronic pain syndrome, myofascial pain syndrome, or fibromyalgia.  Discussed with patient to the translator again today for long amount of time.  MRI of the lumbar spine was independently visualized by me that did not show any type of nerve impingement.  I do feel at this point medications such as could be beneficial.  Started on 20 mg.  Increase gabapentin  to 300 at night.  Will have patient follow-up in 1 to 2 months for further evaluation.

## 2023-11-15 NOTE — Patient Instructions (Addendum)
 Cymbalta 20mg   Gabapentin  300mg  at night Full cocktail today. See me in 6-8 weeks

## 2023-12-15 ENCOUNTER — Ambulatory Visit: Payer: Self-pay | Admitting: Family Medicine

## 2023-12-23 ENCOUNTER — Other Ambulatory Visit: Payer: Self-pay

## 2023-12-23 ENCOUNTER — Telehealth: Payer: Self-pay | Admitting: Family Medicine

## 2023-12-23 MED ORDER — DULOXETINE HCL 20 MG PO CPEP: 20.0000 mg | ORAL_CAPSULE | Freq: Every day | ORAL | 0 refills | Status: DC

## 2023-12-23 NOTE — Telephone Encounter (Signed)
 Patient's daughter called requesting a refill on DULoxetine  (CYMBALTA ) 20 MG capsule to the [facility] at [facility].  She said that this was very helpful with her pain and since she has been off of it for the past 4 days, the pain has started to return.

## 2023-12-27 NOTE — Progress Notes (Unsigned)
 [facility] [address] [place] [address] Phone: [phone] Subjective:   IBryan Caprio, am serving as a scribe for Dr. Ronnell Coins.  I'm seeing this patient by the request  of:  Saguier, Slater Duncan  CC: Multiple joint complaint follow-up  BJY:[id number]  11/15/2023 Had workup that did show positive ANA but oncology did not find any other findings of any autoimmune disease.  At this point do need to consider the possibility of a chronic pain syndrome, myofascial pain syndrome, or fibromyalgia.  Discussed with patient to the translator again today for long amount of time.  MRI of the lumbar spine was independently visualized by me that did not show any type of nerve impingement.  I do feel at this point medications such as could be beneficial.  Started on 20 mg.  Increase gabapentin  to 300 at night.  Will have patient follow-up in 1 to 2 months for further evaluation.     Update 12/28/2023 Jenalee Trevizo is a 58 y.o. female coming in with complaint of R shoulder pain. New medication and follow up. The meds are working. Pain in R rib when bending over. Feels like a knot.  Patient states that does not remember any type of weight loss, any recent injury or any recent illness.  Denies any shortness of breath.       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 , LLE nonocclusive popliteal vein,   treated with coumadin,   History of kidney stones    Hyperlipidemia    Melasma 12/22/2022   OA (osteoarthritis)    hands,  knees   PMB (postmenopausal bleeding)    PMDD (premenstrual dysphoric disorder)    PONV (postoperative nausea and vomiting)    Pre-diabetes    Primary osteoarthritis of right knee 12/28/2019   Rosacea  12/22/2022   Varicose vein of leg    w/  vensous insuff;   hx ablation left GSV 2013 in [place];   left small SV laser ablation @[facility]  06-09-2017   Wears glasses    Past Surgical History:  Procedure Laterality Date   BLADDER SURGERY  12/09/2016   per pt sling   BREAST BIOPSY  06/19/2011   Procedure: BREAST BIOPSY;  Surgeon: Enid Harry, MD;  Location: [facility];  Service: General;  Laterality: Right;  Right breast chronic abscess drainage and debridement   BREAST DUCTAL SYSTEM EXCISION Left 01/24/2014   Procedure:  LEFT BREAST DUCT EXCISION;  Surgeon: Enid Harry, MD;  Location: [facility];  Service: General;  Laterality: Left;   CESAREAN SECTION Bilateral 03/15/2003   x3  first two in [place];  last one @[facility]  ;   WITH BILATERAL TUBAL LIGATION   COLONOSCOPY  08/15/2021   dr Ardine Beckwith   DILATATION & CURETTAGE/HYSTEROSCOPY WITH MYOSURE N/A 05/10/2014   Procedure: DILATATION & CURETTAGE/HYSTEROSCOPY WITH MYOSURE ABLATION,RESECTOSCOPIC POLYPECTOMY;  Surgeon: Davia Erps, MD;  Location: [facility];  Service: Gynecology;  Laterality: N/A;  Confirmed with [employer] rep to be present.   DILITATION & CURRETTAGE/HYSTROSCOPY WITH HYDROTHERMAL ABLATION N/A 06/16/2022   Procedure: DILATATION & CURETTAGE/HYSTEROSCOPY WITH HYDROTHERMAL ABLATION;  Surgeon: Verlyn Goad, MD;  Location:  SURGERY CENTER;  Service: Gynecology;  Laterality: N/A;   INCISE AND DRAIN ABCESS Right 03/05/2011   @[facility]   by dr Delane Fear;   I&D right breast abscess and incisional bx   IR EMBO VENOUS NOT HEMORR HEMANG  INC GUIDE ROADMAPPING  06/09/2017   IR RADIOLOGIST EVAL & MGMT  05/04/2017   IR RADIOLOGIST EVAL & MGMT  06/29/2017   uterine biopsy  2023   VARICOSE VEIN SURGERY Bilateral 2013   in [place]   Social History   Socioeconomic History   Marital status: Married    Spouse name: Not on file   Number of children: 3   Years of education: Not on file   Highest education level: 6th grade   Occupational History   Occupation: [employer]  7pm-4 am  Tobacco Use   Smoking status: Never    Passive exposure: Never   Smokeless tobacco: Never  Vaping Use   Vaping status: Never Used  Substance and Sexual Activity   Alcohol use: Not Currently   Drug use: Never   Sexual activity: Yes    Partners: Male    Birth control/protection: Surgical    Comment: 1st intercourse- 18, partners- 2, married- 15 yrs   Other Topics Concern   Not on file  Social History Narrative   Original from [place]   [place]hold: pt, husband, son and G-son       Social Drivers of Health   Financial Resource Strain: Patient Declined (07/08/2023)   Overall Financial Resource Strain (CARDIA)    Difficulty of Paying Living Expenses: Patient declined  Food Insecurity: Patient Declined (07/08/2023)   Hunger Vital Sign    Worried About Running Out of Food in the Last Year: Patient declined    Ran Out of Food in the Last Year: Patient declined  Transportation Needs: Patient Declined (07/08/2023)   PRAPARE - Administrator, Civil Service (Medical): Patient declined    Lack of Transportation (Non-Medical): Patient declined  Physical Activity: Unknown (12/22/2022)   Exercise Vital Sign    Days of Exercise per Week: 0 days    Minutes of Exercise per Session: Not on file  Stress: Stress Concern Present (12/22/2022)   [employer] of Occupational Health - Occupational Stress Questionnaire    Feeling of Stress : To some extent  Social Connections: Socially Isolated (07/08/2023)   Social Connection and Isolation Panel    Frequency of Communication with Friends and Family: Never    Frequency of Social Gatherings with Friends and Family: Never    Attends Religious Services: Never    Diplomatic Services operational officer: No    Attends Engineer, structural: Not on file    Marital Status: Married   No Known Allergies Family History  Problem Relation Age of Onset   Diabetes Mother     Heart disease Mother        pacemaker   Kidney failure Mother    High Cholesterol Brother        high TG   Healthy Daughter    Healthy Son    High Cholesterol Son    Healthy Son    Diabetes Other        gm   Colon cancer Neg Hx    Breast cancer Neg Hx    Esophageal cancer Neg Hx    Stomach cancer Neg Hx    Rectal cancer Neg Hx     Current Outpatient Medications (Endocrine & Metabolic):    metFORMIN  (GLUCOPHAGE ) 500 MG tablet, Take  1 tablet (500 mg total) by mouth daily with breakfast.      Current Outpatient Medications (Other):    DULoxetine  (CYMBALTA ) 30 MG capsule, Take 1 capsule (30 mg total) by mouth daily.   DULoxetine  (CYMBALTA ) 20 MG capsule, Take 1 capsule (20 mg total) by mouth daily.   gabapentin  (NEURONTIN ) 100 MG capsule, Take 2 capsules (200 mg total) by mouth at bedtime.   gabapentin  (NEURONTIN ) 300 MG capsule, Take 1 capsule (300 mg total) by mouth at bedtime.   Reviewed prior external information including notes and imaging from  primary care provider As well as notes that were available from care everywhere and other healthcare systems.  Past medical history, social, surgical and family history all reviewed in electronic medical record.  No pertanent information unless stated regarding to the chief complaint.   Review of Systems:  No headache, visual changes, nausea, vomiting, diarrhea, constipation, dizziness, abdominal pain, skin rash, fevers, chills, night sweats, weight loss, swollen lymph nodes, body aches, joint swelling, chest pain, shortness of breath, mood changes. POSITIVE muscle aches  Objective  Blood pressure 102/70, pulse 71, height 5' 1 (1.549 m), weight 181 lb (82.1 kg), last menstrual period 08/31/2020, SpO2 98%.   General: No apparent distress alert and oriented x3 mood and affect normal, dressed appropriately.  HEENT: Pupils equal, extraocular movements intact  Respiratory: Patient's speak in full sentences and does not appear short  of breath  Cardiovascular: No lower extremity edema, non tender, no erythema  On exam today patient does have tenderness more in the intercostal area around the T8-T9 area.  Mild upper gastric discomfort noted in the right upper quadrant region.    Impression and Recommendations:     The above documentation has been reviewed and is accurate and complete Willamina Grieshop M Raylan Troiani, DO

## 2023-12-28 ENCOUNTER — Encounter: Payer: Self-pay | Admitting: Family Medicine

## 2023-12-28 ENCOUNTER — Ambulatory Visit (INDEPENDENT_AMBULATORY_CARE_PROVIDER_SITE_OTHER): Payer: Self-pay | Admitting: Family Medicine

## 2023-12-28 VITALS — BP 102/70 | HR 71 | Ht 61.0 in | Wt 181.0 lb

## 2023-12-28 DIAGNOSIS — M2550 Pain in unspecified joint: Secondary | ICD-10-CM

## 2023-12-28 MED ORDER — DULOXETINE HCL 30 MG PO CPEP: 30.0000 mg | ORAL_CAPSULE | Freq: Every day | ORAL | 0 refills | Status: DC

## 2023-12-28 NOTE — Patient Instructions (Signed)
 Cymbalta  30mg  prescribed Watch for pain when associating with eating See you again in 2 months

## 2023-12-28 NOTE — Assessment & Plan Note (Signed)
 Has responded extremely well though to the Cymbalta .  Increase slowly to 30 mg.  I had the help of a interpreter at today.  All questions were answered today.  Discussed with patient that if continuing to have discomfort and pain may need to consider ruling out such things as gallbladder disease.  Total time with patient 31 minutes.

## 2024-02-03 ENCOUNTER — Ambulatory Visit: Payer: Self-pay

## 2024-02-03 ENCOUNTER — Inpatient Hospital Stay: Admission: RE | Admit: 2024-02-03 | Payer: Self-pay | Source: Ambulatory Visit

## 2024-02-18 ENCOUNTER — Telehealth: Payer: Self-pay | Admitting: Family Medicine

## 2024-02-18 ENCOUNTER — Other Ambulatory Visit: Payer: Self-pay | Admitting: Family Medicine

## 2024-02-18 MED ORDER — GABAPENTIN 300 MG PO CAPS: 300.0000 mg | ORAL_CAPSULE | Freq: Every day | ORAL | 0 refills | Status: DC

## 2024-02-18 NOTE — Telephone Encounter (Signed)
 Prescription filled.

## 2024-02-18 NOTE — Telephone Encounter (Signed)
 Pt calling for refill of gabapentin  to [facility].

## 2024-02-29 NOTE — Progress Notes (Unsigned)
 [facility] [address] [place] [address] Phone: [phone] Subjective:    I'm seeing this patient by the request  of:  Saguier, [place], PA-C  CC: multiple joint pain   YEP:Dlagzrupcz  12/28/2023 Has responded extremely well though to the Cymbalta .  Increase slowly to 30 mg.  I had the help of a interpreter at today.  All questions were answered today.  Discussed with patient that if continuing to have discomfort and pain may need to consider ruling out such things as gallbladder disease.  Total time with patient 31 minutes.     Update 03/01/2024 [facility] is a 58 y.o. female coming in with complaint of polyarthralgia. Patient states that she is using Cymbalta  and feels like this medication is making her sleepy and different feeling. Overall better though than last visit.  States that the legs are significantly better at the moment.  Feels like she has been able to do a lot more activity.      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 , LLE nonocclusive popliteal vein,   treated with coumadin,   History of kidney stones    Hyperlipidemia    Melasma 12/22/2022   OA (osteoarthritis)    hands,  knees   PMB (postmenopausal bleeding)    PMDD (premenstrual dysphoric disorder)    PONV (postoperative nausea and vomiting)    Pre-diabetes    Primary osteoarthritis of right knee 12/28/2019   Rosacea 12/22/2022   Varicose vein of leg    w/  vensous insuff;   hx ablation left GSV 2013 in [place];   left small SV laser ablation @[facility]  06-09-2017   Wears glasses    Past Surgical History:  Procedure Laterality Date   BLADDER SURGERY  12/09/2016   per pt sling   BREAST BIOPSY  06/19/2011   Procedure: BREAST BIOPSY;  Surgeon:  Donnice Bury, MD;  Location: [facility];  Service: General;  Laterality: Right;  Right breast chronic abscess drainage and debridement   BREAST DUCTAL SYSTEM EXCISION Left 01/24/2014   Procedure:  LEFT BREAST DUCT EXCISION;  Surgeon: Donnice Bury, MD;  Location: [facility];  Service: General;  Laterality: Left;   CESAREAN SECTION Bilateral 03/15/2003   x3  first two in [place];  last one @[facility]  ;   WITH BILATERAL TUBAL LIGATION   COLONOSCOPY  08/15/2021   dr ernesto   DILATATION & CURETTAGE/HYSTEROSCOPY WITH MYOSURE N/A 05/10/2014   Procedure: DILATATION & CURETTAGE/HYSTEROSCOPY WITH MYOSURE ABLATION,RESECTOSCOPIC POLYPECTOMY;  Surgeon: Curlee VEAR Guan, MD;  Location: [facility];  Service: Gynecology;  Laterality: N/A;  Confirmed with [employer] rep to be present.   DILITATION & CURRETTAGE/HYSTROSCOPY WITH HYDROTHERMAL ABLATION N/A 06/16/2022   Procedure: DILATATION & CURETTAGE/HYSTEROSCOPY WITH HYDROTHERMAL ABLATION;  Surgeon: Alger Gong, MD;  Location: [facility];  Service: Gynecology;  Laterality: N/A;   INCISE AND DRAIN ABCESS Right 03/05/2011   @[facility]   by dr bury;   I&D right breast abscess and incisional bx   IR EMBO VENOUS NOT HEMORR HEMANG  INC GUIDE ROADMAPPING  06/09/2017   IR RADIOLOGIST EVAL & MGMT  05/04/2017   IR RADIOLOGIST EVAL & MGMT  06/29/2017  uterine biopsy  2023   VARICOSE VEIN SURGERY Bilateral 2013   in [place]   Social History   Socioeconomic History   Marital status: Married    Spouse name: Not on file   Number of children: 3   Years of education: Not on file   Highest education level: 6th grade  Occupational History   Occupation: [employer]  7pm-4 am  Tobacco Use   Smoking status: Never    Passive exposure: Never   Smokeless tobacco: Never  Vaping Use   Vaping status: Never Used  Substance and Sexual Activity   Alcohol use: Not Currently   Drug use: Never   Sexual activity: Yes    Partners: Male    Birth  control/protection: Surgical    Comment: 1st intercourse- 18, partners- 2, married- 15 yrs   Other Topics Concern   Not on file  Social History Narrative   Original from [place]   [place]oushold: pt, husband, son and G-son       Social Drivers of Health   Financial Resource Strain: Patient Declined (07/08/2023)   Overall Financial Resource Strain (CARDIA)    Difficulty of Paying Living Expenses: Patient declined  Food Insecurity: Patient Declined (07/08/2023)   Hunger Vital Sign    Worried About Running Out of Food in the Last Year: Patient declined    Ran Out of Food in the Last Year: Patient declined  Transportation Needs: Patient Declined (07/08/2023)   PRAPARE - Administrator, Civil Service (Medical): Patient declined    Lack of Transportation (Non-Medical): Patient declined  Physical Activity: Unknown (12/22/2022)   Exercise Vital Sign    Days of Exercise per Week: 0 days    Minutes of Exercise per Session: Not on file  Stress: Stress Concern Present (12/22/2022)   [employer] of Occupational Health - Occupational Stress Questionnaire    Feeling of Stress : To some extent  Social Connections: Socially Isolated (07/08/2023)   Social Connection and Isolation Panel    Frequency of Communication with Friends and Family: Never    Frequency of Social Gatherings with Friends and Family: Never    Attends Religious Services: Never    Diplomatic Services operational officer: No    Attends Engineer, structural: Not on file    Marital Status: Married   No Known Allergies Family History  Problem Relation Age of Onset   Diabetes Mother    Heart disease Mother        pacemaker   Kidney failure Mother    High Cholesterol Brother        high TG   Healthy Daughter    Healthy Son    High Cholesterol Son    Healthy Son    Diabetes Other        gm   Colon cancer Neg Hx    Breast cancer Neg Hx    Esophageal cancer Neg Hx    Stomach cancer Neg Hx    Rectal  cancer Neg Hx     Current Outpatient Medications (Endocrine & Metabolic):    metFORMIN  (GLUCOPHAGE ) 500 MG tablet, Take 1 tablet (500 mg total) by mouth daily with breakfast.      Current Outpatient Medications (Other):    DULoxetine  (CYMBALTA ) 30 MG capsule, Take 1 capsule (30 mg total) by mouth daily.   gabapentin  (NEURONTIN ) 300 MG capsule, Take 1 capsule (300 mg total) by mouth at bedtime.   DULoxetine  (CYMBALTA ) 20 MG capsule, Take 1 capsule (  20 mg total) by mouth daily.   gabapentin  (NEURONTIN ) 100 MG capsule, Take 2 capsules (200 mg total) by mouth at bedtime.   Reviewed prior external information including notes and imaging from  primary care provider As well as notes that were available from care everywhere and other healthcare systems.  Past medical history, social, surgical and family history all reviewed in electronic medical record.  No pertanent information unless stated regarding to the chief complaint.   Review of Systems:  No headache, visual changes, nausea, vomiting, diarrhea, constipation, dizziness, abdominal pain, skin rash, fevers, chills, night sweats, weight loss, swollen lymph nodes, body aches, joint swelling, chest pain, shortness of breath, mood changes. POSITIVE muscle aches.  Objective  Blood pressure 118/86, pulse 91, height 5' 1 (1.549 m), weight 183 lb (83 kg), last menstrual period 08/31/2020, SpO2 95%.   General: No apparent distress alert and oriented x3 mood and affect normal, dressed appropriately.  Patient is accompanied with a translator HEENT: Pupils equal, extraocular movements intact  Respiratory: Patient's speak in full sentences and does not appear short of breath  Cardiovascular: No lower extremity edema, non tender, no erythema  Patient does have tightness noted in the paraspinal musculature.  Sitting mL comfortably overall.     Impression and Recommendations:    The above documentation has been reviewed and is accurate and  complete Duglas Heier M Anabela Crayton, DO

## 2024-03-01 ENCOUNTER — Ambulatory Visit (INDEPENDENT_AMBULATORY_CARE_PROVIDER_SITE_OTHER): Payer: Self-pay | Admitting: Family Medicine

## 2024-03-01 ENCOUNTER — Other Ambulatory Visit: Payer: Self-pay

## 2024-03-01 ENCOUNTER — Encounter: Payer: Self-pay | Admitting: Family Medicine

## 2024-03-01 VITALS — BP 118/86 | HR 91 | Ht 61.0 in | Wt 183.0 lb

## 2024-03-01 DIAGNOSIS — M2550 Pain in unspecified joint: Secondary | ICD-10-CM

## 2024-03-01 MED ORDER — VENLAFAXINE HCL ER 37.5 MG PO CP24: 37.5000 mg | ORAL_CAPSULE | Freq: Every day | ORAL | 0 refills | Status: DC

## 2024-03-01 NOTE — Patient Instructions (Signed)
 Stop Cymbalta  Start Effexor  37.5 Watch for side effects and if side effects with change to Cymbalta  20mg  Continue gabapentin  See me in 2-3 months

## 2024-03-01 NOTE — Assessment & Plan Note (Signed)
 Patient had been responding extremely well to the Cymbalta  but unfortunately started having increasing side effects.  Discussed potentially going back down on the Cymbalta  but instead we will try to change to the Effexor .  37.5 mg.  Warned of different side effects including palpitations, weight loss or weight gain.  And then we did discuss sometimes the trouble of coming off the medication.  Will transition back to the Cymbalta  20 mg if patient does not respond well.  Was able to discussed with patient through the aid of a translator today.  Total time reviewing chart and discussing with patient 31 minutes

## 2024-03-28 ENCOUNTER — Telehealth: Payer: Self-pay | Admitting: Radiology

## 2024-03-28 NOTE — Telephone Encounter (Signed)
 Copied from CRM [id number]. Topic: General - Other >> Mar 28, 2024  9:21 AM Mercedes MATSU wrote: Reason for CRM: Patient called in requesting a copy of all her immunizations, I informed the patient that she can get that from [facility]. Patient also states that if she can not access her mychart would she be able to pick up a hard copy of her immunization records from the front desk. Patient also is requesting a round of vaccines if she is do. Patient can be reached at [phone].

## 2024-04-03 ENCOUNTER — Ambulatory Visit: Payer: Self-pay | Admitting: Medical

## 2024-04-25 ENCOUNTER — Ambulatory Visit: Payer: Self-pay | Admitting: Medical

## 2024-04-25 ENCOUNTER — Ambulatory Visit (INDEPENDENT_AMBULATORY_CARE_PROVIDER_SITE_OTHER): Payer: Self-pay | Admitting: Medical

## 2024-04-25 VITALS — BP 116/78 | HR 77 | Temp 98.0°F | Resp 15 | Ht 61.0 in | Wt 191.2 lb

## 2024-04-25 DIAGNOSIS — S83279S Complex tear of lateral meniscus, current injury, unspecified knee, sequela: Secondary | ICD-10-CM

## 2024-04-25 DIAGNOSIS — R631 Polydipsia: Secondary | ICD-10-CM

## 2024-04-25 DIAGNOSIS — R7689 Other specified abnormal immunological findings in serum: Secondary | ICD-10-CM

## 2024-04-25 DIAGNOSIS — M2550 Pain in unspecified joint: Secondary | ICD-10-CM

## 2024-04-25 DIAGNOSIS — R739 Hyperglycemia, unspecified: Secondary | ICD-10-CM

## 2024-04-25 DIAGNOSIS — Z23 Encounter for immunization: Secondary | ICD-10-CM

## 2024-04-25 DIAGNOSIS — R350 Frequency of micturition: Secondary | ICD-10-CM

## 2024-04-25 LAB — POC URINALSYSI DIPSTICK (AUTOMATED)
Bilirubin, UA: NEGATIVE
Blood, UA: NEGATIVE
Glucose, UA: NEGATIVE
Ketones, UA: NEGATIVE
Nitrite, UA: NEGATIVE
Protein, UA: NEGATIVE
Spec Grav, UA: 1.01 (ref 1.010–1.025)
Urobilinogen, UA: 0.2 U/dL
pH, UA: 6.5 (ref 5.0–8.0)

## 2024-04-25 NOTE — Patient Instructions (Signed)
 Polyuria and polydipsia Possible hyperglycemia with slightly elevated glucose levels. - Order metabolic panel to assess renal function, hepatic function, sodium levels, and HbA1c. -urine showed 1+ leukocytes. Culture pending  Chronic joint pain, multiple sites Chronic arthralgia in multiple joints with inconsistent ANA tests. Potential rheumatologic disorder considered. - Order ANA, rheumatoid factor, and C-reactive protein tests. - Consider referral to another rheumatologist if test results are elevated. -  Bilateral shoulder pain with prior inflammation Mild returning pain after initial improvement with medication and injection. - Continue management with sports medicine specialist. - Discuss potential increase in Cymbalta  dosage at upcoming appointment. -continue gabapentin  and cymbalta   Right-sided back pain, possible nerve compression Intermittent positional pain possibly due to nerve compression. Previous MRI negative. - Discussed potential referral to physical therapy, but she prefers to wait for her upcoming appointment with the sports medicine specialist.  Follow up date to be determined after lab review

## 2024-04-25 NOTE — Progress Notes (Signed)
 Subjective:    Patient ID: Taylor Parker, female    DOB: 1966-01-29, 58 y.o.   MRN: [id number]  HPI Taylor Parker is a 58 year old female who presents with polyuria and polydipsia.  She experiences excessive thirst and frequent urination, waking up three to four times per night to urinate. She consumes three to four regular bottles of water daily. No pain during urination and her hunger is normal. On review mild sugar elevation in the past.  She has a history of joint pain affecting her shoulders, wrists, and fingers. Two years ago, she experienced a severe episode that required hospitalization due to an inability to lift herself. Currently, she has intermittent sharp pain in her right side, especially when climbing stairs or making certain movements. An MRI performed on July 10, 2023, was negative.  She is on gabapentin  and Cymbalta  for pain management, which were prescribed by her sports medicine doctor. Initially, her pain level was a ten, but it decreased significantly with medication. However, the pain is gradually returning. She is unsure of the exact dosage of Cymbalta  but believes it might be thirty milligrams.  Her ANA tests have shown positive results multiple times, although the most recent test was negative. Pt indicates she want to repeat labs and is interested in getting second opinion.  She experiences pain when sitting in her car, localized to the side rather than the center of her back. She has tried hanging from a bar in a park to relieve the sensation of compression, which she feels is not related to her kidneys as she does not experience pain during urination.   Review of Systems  Constitutional:  Negative for chills, fatigue and fever.  Respiratory:  Negative for cough, chest tightness and wheezing.   Cardiovascular:  Negative for chest pain and palpitations.  Gastrointestinal:  Negative for abdominal distention, anal bleeding, blood in stool and  constipation.  Musculoskeletal:  Positive for arthralgias and back pain.       Symmetric, shoulders and wrists.  Skin:  Negative for rash.  Neurological:  Negative for dizziness and headaches.  Hematological:  Negative for adenopathy.  Psychiatric/Behavioral:  Negative for behavioral problems and decreased concentration.       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 , LLE nonocclusive popliteal vein,   treated with coumadin,   History of kidney stones    Hyperlipidemia    Melasma 12/22/2022   OA (osteoarthritis)    hands,  knees   PMB (postmenopausal bleeding)    PMDD (premenstrual dysphoric disorder)    PONV (postoperative nausea and vomiting)    Pre-diabetes    Primary osteoarthritis of right knee 12/28/2019   Rosacea 12/22/2022   Varicose vein of leg    w/  vensous insuff;   hx ablation left GSV 2013 in [place];   left small SV laser ablation @[facility]  06-09-2017   Wears glasses      Social History   Socioeconomic History   Marital status: Married    Spouse name: Not on file   Number of children: 3   Years of education: Not on file   Highest education level: 6th grade  Occupational History   Occupation:  [employer]  7pm-4 am  Tobacco Use   Smoking status: Never    Passive exposure: Never   Smokeless tobacco: Never  Vaping Use   Vaping status: Never Used  Substance and Sexual Activity   Alcohol use: Not Currently   Drug use: Never   Sexual activity: Yes    Partners: Male    Birth control/protection: Surgical    Comment: 1st intercourse- 59, partners- 2, married- 15 yrs   Other Topics Concern   Not on file  Social History Narrative   Original from [place]   Houshold: pt, husband, son and G-son       Social Drivers of Health   Financial Resource  Strain: Patient Declined (04/22/2024)   Overall Financial Resource Strain (CARDIA)    Difficulty of Paying Living Expenses: Patient declined  Food Insecurity: Patient Declined (04/22/2024)   Hunger Vital Sign    Worried About Running Out of Food in the Last Year: Patient declined    Ran Out of Food in the Last Year: Patient declined  Transportation Needs: Patient Declined (04/22/2024)   PRAPARE - Administrator, Civil Service (Medical): Patient declined    Lack of Transportation (Non-Medical): Patient declined  Physical Activity: Unknown (04/22/2024)   Exercise Vital Sign    Days of Exercise per Week: Patient declined    Minutes of Exercise per Session: Not on file  Stress: Patient Declined (04/22/2024)   [employer] of Occupational Health - Occupational Stress Questionnaire    Feeling of Stress: Patient declined  Social Connections: Unknown (04/22/2024)   Social Connection and Isolation Panel    Frequency of Communication with Friends and Family: Patient declined    Frequency of Social Gatherings with Friends and Family: Patient declined    Attends Religious Services: Patient declined    Active Member of Clubs or Organizations: Patient declined    Attends Banker Meetings: Not on file    Marital Status: Married  Intimate Partner Violence: Not on file    Past Surgical History:  Procedure Laterality Date   BLADDER SURGERY  12/09/2016   per pt sling   BREAST BIOPSY  06/19/2011   Procedure: BREAST BIOPSY;  Surgeon: Donnice Bury, MD;  Location: [facility];  Service: General;  Laterality: Right;  Right breast chronic abscess drainage and debridement   BREAST DUCTAL SYSTEM EXCISION Left 01/24/2014   Procedure:  LEFT BREAST DUCT EXCISION;  Surgeon: Donnice Bury, MD;  Location: [facility];  Service: General;  Laterality: Left;   CESAREAN SECTION Bilateral 03/15/2003   x3  first two in [place];  last one @[facility]  ;   WITH BILATERAL TUBAL  LIGATION   COLONOSCOPY  08/15/2021   dr ernesto   DILATATION & CURETTAGE/HYSTEROSCOPY WITH MYOSURE N/A 05/10/2014   Procedure: DILATATION & CURETTAGE/HYSTEROSCOPY WITH MYOSURE ABLATION,RESECTOSCOPIC POLYPECTOMY;  Surgeon: Curlee VEAR Guan, MD;  Location: [facility];  Service: Gynecology;  Laterality: N/A;  Confirmed with Myosure rep to be present.   DILITATION & CURRETTAGE/HYSTROSCOPY WITH HYDROTHERMAL ABLATION N/A 06/16/2022   Procedure: DILATATION & CURETTAGE/HYSTEROSCOPY WITH HYDROTHERMAL ABLATION;  Surgeon: Alger Gong, MD;  Location: [facility];  Service: Gynecology;  Laterality: N/A;   INCISE AND DRAIN ABCESS Right 03/05/2011   @[facility]   by dr bury;   I&D right breast abscess and incisional bx   IR EMBO VENOUS NOT HEMORR HEMANG  INC GUIDE ROADMAPPING  06/09/2017   IR RADIOLOGIST EVAL & MGMT  05/04/2017   IR RADIOLOGIST EVAL & MGMT  06/29/2017   uterine biopsy  2023   VARICOSE VEIN SURGERY Bilateral 2013   in [place]    Family History  Problem Relation Age of Onset   Diabetes Mother    Heart disease Mother        pacemaker   Kidney failure Mother    High Cholesterol Brother        high TG   Healthy Daughter    Healthy Son    High Cholesterol Son    Healthy Son    Diabetes Other        gm   Colon cancer Neg Hx    Breast cancer Neg Hx    Esophageal cancer Neg Hx    Stomach cancer Neg Hx    Rectal cancer Neg Hx     No Known Allergies  Current Outpatient Medications on File Prior to Visit  Medication Sig Dispense Refill   DULoxetine  (CYMBALTA ) 20 MG capsule Take 1 capsule (20 mg total) by mouth daily. 30 capsule 0   gabapentin  (NEURONTIN ) 300 MG capsule Take 1 capsule (300 mg total) by mouth at bedtime. 90 capsule 0   No current facility-administered medications on file prior to visit.    BP 116/78   Pulse 77   Temp 98 F (36.7 C) (Oral)   Resp 15   Ht 5' 1 (1.549 m)   Wt 191 lb 3.2 oz (86.7 kg)   LMP 08/31/2020 (Exact Date)   SpO2 96%   BMI  36.13 kg/m           Objective:   Physical Exam  General Mental Status- Alert. General Appearance- Not in acute distress.   Skin General: Color- Normal Color. Moisture- Normal Moisture.  Neck Carotid Arteries- Normal color. Moisture- Normal Moisture. No carotid bruits. No JVD.  Chest and Lung Exam Auscultation: Breath Sounds:-CTA  Cardiovascular Auscultation:Rythm- RRR Murmurs & Other Heart Sounds:Auscultation of the heart reveals- No Murmurs.  Abdomen Inspection:-Inspeection Normal. Palpation/Percussion:Note:No mass. Palpation and Percussion of the abdomen reveal- Non Tender, Non Distended + BS, no rebound or guarding.   Neurologic Cranial Nerve exam:- CN III-XII intact(No nystagmus), symmetric smile. Strength:- 5/5 equal and symmetric strength both upper and lower extremities.       Assessment & Plan:   Polyuria and polydipsia Possible hyperglycemia with slightly elevated glucose levels. - Order metabolic panel to assess renal function, hepatic function, sodium levels, and HbA1c. -urine showed 1+ leukocytes. Culture pending  Chronic joint pain, multiple sites Chronic arthralgia in multiple joints with inconsistent ANA tests. Potential rheumatologic disorder considered. - Order ANA, rheumatoid factor, and C-reactive protein tests. - Consider referral to another rheumatologist if test results are elevated. -  Bilateral shoulder pain with prior inflammation Mild returning pain after initial improvement with medication and injection. - Continue management with sports medicine specialist. - Discuss potential increase in Cymbalta  dosage at upcoming appointment. -continue gabapentin  and cymbalta   Right-sided back pain, possible nerve compression Intermittent positional pain possibly due to nerve compression. Previous MRI negative. - Discussed potential referral to physical therapy, but she prefers to wait for her upcoming appointment with the sports medicine  specialist.  Follow up date to be determined after lab review.   Anise Harbin, PA-C

## 2024-04-26 LAB — SEDIMENTATION RATE: Sed Rate: 16 mm/h (ref 0–30)

## 2024-04-26 LAB — C-REACTIVE PROTEIN: CRP: <0.5 mg/dL (ref 0.5–20.0)

## 2024-04-26 LAB — HEMOGLOBIN A1C: Hgb A1c MFr Bld: 6.1 % (ref 4.6–6.5)

## 2024-04-27 LAB — URINE CULTURE
MICRO NUMBER:: 17096896
SPECIMEN QUALITY:: ADEQUATE

## 2024-04-27 LAB — RHEUMATOID FACTOR: Rheumatoid fact SerPl-aCnc: <10 [IU]/mL (ref <14)

## 2024-04-27 LAB — ANA: Anti Nuclear Antibody (ANA): POSITIVE — AB

## 2024-04-27 LAB — ANTI-NUCLEAR AB-TITER (ANA TITER): ANA Titer 1: 1:640 {titer} — ABNORMAL HIGH

## 2024-04-27 NOTE — Addendum Note (Signed)
 Addended by: DORINA DALLAS HERO on: 04/27/2024 06:03 PM   Modules accepted: Orders

## 2024-04-28 NOTE — Progress Notes (Unsigned)
 [facility] [address] [place] [address] Phone: [phone] Subjective:   [name], am serving as a scribe for Dr. Arthea Claudene.  I'm seeing this patient by the request  of:  S[place]ier, [place] RIGGERS  CC: Low back pain, upper back pain  YEP:Dlagzrupcz  03/01/2024 Patient had been responding extremely well to the Cymbalta  but unfortunately started having increasing side effects.  Discussed potentially going back down on the Cymbalta  but instead we will try to change to the Effexor .  37.5 mg.  Warned of different side effects including palpitations, weight loss or weight gain.  And then we did discuss sometimes the trouble of coming off the medication.  Will transition back to the Cymbalta  20 mg if patient does not respond well.  Was able to discussed with patient through the aid of a translator today.  Total time reviewing chart and discussing with patient 31 minutes      Update 05/01/2024 Thaila Bottoms is a 58 y.o. female coming in with complaint of polyarthralgia. Patient states that she has pain throughout the entire body. Does feel like Cymbalta  is helpful.   Patient states that she has shooting pain into the lower back. Pain in the R arm and neck. Pain will run from C spine to lower back.   Has been doing stretches and tried hanging on bar at park and this helped to alleviate her pain.       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;   hx ablation left GSV 2013 in [place];   left small SV laser ablation @[facility]  06-09-2017   Wears glasses    Past Surgical History:  Procedure Laterality Date   BLADDER SURGERY  12/09/2016   per pt sling   BREAST BIOPSY  06/19/2011   Procedure: BREAST BIOPSY;  Surgeon: Donnice Bury, MD;  Location: [facility];  Service: General;  Laterality: Right;  Right breast chronic abscess drainage and debridement   BREAST DUCTAL SYSTEM EXCISION Left 01/24/2014   Procedure:  LEFT BREAST DUCT EXCISION;  Surgeon: Donnice Bury, MD;  Location: [facility];  Service: General;  Laterality: Left;   CESAREAN SECTION Bilateral 03/15/2003   x3  first two in [place];  last one @[facility]  ;   WITH BILATERAL TUBAL LIGATION   COLONOSCOPY  08/15/2021   dr ernesto   DILATATION & CURETTAGE/HYSTEROSCOPY WITH MYOSURE N/A 05/10/2014   Procedure: DILATATION & CURETTAGE/HYSTEROSCOPY WITH MYOSURE ABLATION,RESECTOSCOPIC POLYPECTOMY;  Surgeon: Curlee VEAR Guan, MD;  Location: [facility];  Service: Gynecology;  Laterality: N/A;  Confirmed with My[employer] rep to be present.   DILITATION & CURRETTAGE/HYSTROSCOPY WITH HYDROTHERMAL ABLATION N/A 06/16/2022   Procedure: DILATATION & CURETTAGE/HYSTEROSCOPY  WITH HYDROTHERMAL ABLATION;  Surgeon: Alger Gong, MD;  Location: [facility];  Service: Gynecology;  Laterality: N/A;   INCISE AND DRAIN ABCESS Right 03/05/2011   @[facility]   by dr ebbie;   I&D right breast abscess and incisional bx   IR EMBO VENOUS NOT HEMORR HEMANG  INC GUIDE ROADMAPPING  06/09/2017   IR RADIOLOGIST EVAL & MGMT  05/04/2017   IR RADIOLOGIST EVAL & MGMT  06/29/2017   uterine biopsy  2023   VARICOSE VEIN SURGERY Bilateral 2013   in [place]   Social History   Socioeconomic History   Marital status: Married    Spouse name: Not on file   Number of  children: 3   Years of education: Not on file   Highest education level: 6th grade  Occupational History   Occupation: [employer]  7pm-4 am  Tobacco Use   Smoking status: Never    Passive exposure: Never   Smokeless tobacco: Never  Vaping Use   Vaping status: Never Used  Substance and Sexual Activity   Alcohol use: Not Currently   Drug use: Never   Sexual activity: Yes    Partners: Male    Birth control/protection: Surgical    Comment: 1st intercourse- 18, partners- 2, married- 15 yrs   Other Topics Concern   Not on file  Social History Narrative   Original from [place]   Houshold: pt, husband, son and G-son       Social Drivers of Health   Financial Resource Strain: Patient Declined (04/22/2024)   Overall Financial Resource Strain (CARDIA)    Difficulty of Paying Living Expenses: Patient declined  Food Insecurity: Patient Declined (04/22/2024)   Hunger Vital Sign    Worried About Running Out of Food in the Last Year: Patient declined    Ran Out of Food in the Last Year: Patient declined  Transportation Needs: Patient Declined (04/22/2024)   PRAPARE - Administrator, Civil Service (Medical): Patient declined    Lack of Transportation (Non-Medical): Patient declined  Physical Activity: Unknown (04/22/2024)   Exercise Vital Sign    Days of Exercise per Week: Patient declined    Minutes of Exercise per Session: Not on file  Stress: Patient Declined (04/22/2024)   [employer] of Occupational Health - Occupational Stress Questionnaire    Feeling of Stress: Patient declined  Social Connections: Unknown (04/22/2024)   Social Connection and Isolation Panel    Frequency of Communication with Friends and Family: Patient declined    Frequency of Social Gatherings with Friends and Family: Patient declined    Attends Religious Services: Patient declined    Database administrator or Organizations: Patient declined    Attends Engineer, structural: Not on  file    Marital Status: Married   No Known Allergies Family History  Problem Relation Age of Onset   Diabetes Mother    Heart disease Mother        pacemaker   Kidney failure Mother    High Cholesterol Brother        high TG   Healthy Daughter    Healthy Son    High Cholesterol Son    Healthy Son    Diabetes Other        gm   Colon cancer Neg Hx    Breast cancer Neg Hx    Esophageal cancer Neg Hx    Stomach cancer Neg Hx    Rectal cancer Neg Hx  Current Outpatient Medications (Analgesics):    celecoxib (CELEBREX) 100 MG capsule, Take 1 capsule (100 mg total) by mouth 2 (two) times daily.   Current Outpatient Medications (Other):    DULoxetine  (CYMBALTA ) 20 MG capsule, Take 1 capsule (20 mg total) by mouth daily.   gabapentin  (NEURONTIN ) 300 MG capsule, Take 1 capsule (300 mg total) by mouth at bedtime.   Reviewed prior external information including notes and imaging from  primary care provider As well as notes that were available from care everywhere and other healthcare systems.  Past medical history, social, surgical and family history all reviewed in electronic medical record.  No pertanent information unless stated regarding to the chief complaint.   Review of Systems:  No headache, visual changes, nausea, vomiting, diarrhea, constipation, dizziness, abdominal pain, skin rash, fevers, chills, night sweats, weight loss, swollen lymph nodes, body aches, joint swelling, chest pain, shortness of breath, mood changes. POSITIVE muscle aches  Objective  Blood pressure 100/68, pulse 81, height 5' 1 (1.549 m), weight 188 lb (85.3 kg), last menstrual period 08/31/2020, SpO2 98%.   General: No apparent distress alert and oriented x3 mood and affect normal, dressed appropriately.  HEENT: Pupils equal, extraocular movements intact  Respiratory: Patient's speak in full sentences and does not appear short of breath  Cardiovascular: No lower extremity edema, non  tender, no erythema  Significant trigger points noted in the right shoulder blade including the rhomboid, trapezius, latissimus dorsi.  After verbal consent patient was prepped with alcohol swab and with a 25-gauge half inch needle injected in 4 distinct trigger points in the rhomboid, latissimus dorsi, trapezius muscle.  A total of 3 cc of 0.5% Marcaine  and 1 cc of Kenalog  40 mg/mL used.  No blood loss.  Band-Aid placed.  Postinjection instructions given.   Impression and Recommendations:    The above documentation has been reviewed and is accurate and complete Marvens Hollars M Krishon Adkison, DO

## 2024-05-01 ENCOUNTER — Ambulatory Visit (INDEPENDENT_AMBULATORY_CARE_PROVIDER_SITE_OTHER): Payer: Self-pay | Admitting: Family Medicine

## 2024-05-01 ENCOUNTER — Encounter: Payer: Self-pay | Admitting: Family Medicine

## 2024-05-01 VITALS — BP 100/68 | HR 81 | Ht 61.0 in | Wt 188.0 lb

## 2024-05-01 DIAGNOSIS — M25511 Pain in right shoulder: Secondary | ICD-10-CM

## 2024-05-01 DIAGNOSIS — M5416 Radiculopathy, lumbar region: Secondary | ICD-10-CM

## 2024-05-01 MED ORDER — GABAPENTIN 300 MG PO CAPS: 300.0000 mg | ORAL_CAPSULE | Freq: Every day | ORAL | 0 refills | Status: AC

## 2024-05-01 MED ORDER — CELECOXIB 100 MG PO CAPS: 100.0000 mg | ORAL_CAPSULE | Freq: Two times a day (BID) | ORAL | 0 refills | Status: DC

## 2024-05-01 NOTE — Assessment & Plan Note (Signed)
 Continues to have some difficulty.  Not responding as well to the Cymbalta  and given some Celebrex as well of 100 mg twice a day.  Warned of potential reflux and discussed short-term using Prilosec over-the-counter.  Did have a translator with us  today.  Attempted some trigger point injections as well.  Hopeful that this will make some improvement.  Discussed icing regimen and home exercises and follow-up again in 6 to 8 weeks otherwise.

## 2024-05-01 NOTE — Assessment & Plan Note (Signed)
 Patient given injections and tolerated the procedure well, discussed icing regimen and home exercises, which activities to do and which ones to avoid.  Increase activity slowly.  Discussed avoiding certain activities.  Follow-up again in 6 to 8 weeks

## 2024-05-01 NOTE — Patient Instructions (Addendum)
 Celebrex 2x a day Trigger point injections Prilosec daily for 2 weeks for stomach B12 1000mcg daily See me again in 2-3 months

## 2024-05-17 NOTE — Progress Notes (Unsigned)
 Ms. Lidya Mccalister is a 58 y.o. [phone] female who presents to [facility] clinic today with {Blank single:[id number]::no complaints,complaint of} ***.    Pap Smear: Pap smear completed today. Last Pap smear was 05/11/2023 at the free cervical cancer screening clinic and was normal with negative HPV. Per patient has history of an abnormal Pap smear 03/24/2022 that was LSIL with negative HPV that a colposcopy was completed 04/16/2022 that showed CIN I. Last Pap smear result is available in Epic.   Physical exam: Breasts Breasts symmetrical. Scars bilateral breasts due to history of benign breast surgeries. Right breast scar observed at 9 o'clock next to areola and left breast scar observed next to areola inner breast. No nipple retraction bilateral breasts. No nipple discharge bilateral breasts. No lymphadenopathy. No lumps palpated bilateral breasts. No complaints of pain or tenderness on exam.    Pelvic/Bimanual Ext Genitalia No lesions, no swelling and no discharge observed on external genitalia.        Vagina Vagina pink and normal texture. No lesions or discharge observed in vagina.        Cervix Cervix is present. Cervix pink and of normal texture. No discharge observed.    Uterus Uterus is present and palpable. Uterus in normal position and normal size.        Adnexae Bilateral ovaries present and palpable. No tenderness on palpation.         Rectovaginal No rectal exam completed today since patient had no rectal complaints. No skin abnormalities observed on exam.     Smoking History: Patient has never smoked.   Patient Navigation: Patient education provided. Access to services provided for patient through [facility] program. *** interpreter provided. *** transportation provided   Colorectal Cancer Screening: Patient has had colonoscopy completed on 07/03/2016. No complaints today.     Breast and Cervical Cancer Risk Assessment: Patient does not have family history of breast  cancer, known genetic mutations, or radiation treatment to the chest before age 23. Patient has history of cervical dysplasia. Patient has no history of being immunocompromised or DES exposure in-utero.  Risk Scores as of Encounter on 05/18/2024     Alisa as of 11/12/2022           5-year 1.3%   Lifetime 8.49%   This patient is [place]/Lat[place] but has no documented birth country, so the [place] model used data from [place] patients to calculate their risk score. Document a birth country in the Demographics activity for a more accurate score.         Last calculated by Silas, Ansyi K, CMA on 11/12/2022 at  9:16 AM        A: [facility] exam with pap smear Complaint of ***  P: Referred patient to the [facility] for a screening mammogram on mobile unit. Appointment scheduled Thursday, May 18, 2024 at 0930.  Driscilla Wanda SQUIBB, RN 05/17/2024 11:52 AM

## 2024-05-18 ENCOUNTER — Ambulatory Visit
Admission: RE | Admit: 2024-05-18 | Discharge: 2024-05-18 | Disposition: A | Discharge status: Home / Self Care | Payer: Self-pay | Source: Ambulatory Visit | Attending: Obstetrics and Gynecology | Admitting: Obstetrics and Gynecology

## 2024-05-18 ENCOUNTER — Ambulatory Visit: Payer: Self-pay | Admitting: *Deleted

## 2024-05-18 VITALS — BP 118/65 | Ht 61.0 in | Wt 183.0 lb

## 2024-05-18 DIAGNOSIS — Z1231 Encounter for screening mammogram for malignant neoplasm of breast: Secondary | ICD-10-CM

## 2024-05-18 DIAGNOSIS — Z01419 Encounter for gynecological examination (general) (routine) without abnormal findings: Secondary | ICD-10-CM

## 2024-05-18 NOTE — Patient Instructions (Addendum)
 Explained breast self awareness with Taylor Parker. Patient did not need a Pap smear today due to last Pap smear and HPV was 05/11/2023. Let her know [facility] will cover Pap smears and HPV typing every 5 years unless has a history of abnormal Pap smears. Referred patient to the [facility] for a screening mammogram on mobile unit. Appointment scheduled Thursday, May 18, 2024 at 0930. Patient aware of appointment and will be there. Let patient know the Breast Center will follow up with her within the next couple weeks with results of mammogram by letter or phone. [name] verbalized understanding.  Dudley Cooley, Wanda Ship, RN 8:51 AM

## 2024-05-23 ENCOUNTER — Ambulatory Visit: Payer: Self-pay | Admitting: Obstetrics & Gynecology

## 2024-05-23 ENCOUNTER — Ambulatory Visit: Payer: Self-pay

## 2024-05-23 LAB — CYTOLOGY - PAP
Comment: NEGATIVE
Diagnosis: NEGATIVE
High risk HPV: NEGATIVE

## 2024-07-03 NOTE — Progress Notes (Unsigned)
 " [facility] [address] [place] [address] Phone: [phone] Subjective:   Taylor Parker, am serving as a scribe for Dr. Arthea Claudene.  I'm seeing this patient by the request  of:  [place], [place] RIGGERS  CC: Low back and right shoulder pain  YEP:Dlagzrupcz  05/01/2024 Patient given injections and tolerated the procedure well, discussed icing regimen and home exercises, which activities to do and which ones to avoid.  Increase activity slowly.  Discussed avoiding certain activities.  Follow-up again in 6 to 8 weeks     Continues to have some difficulty.  Not responding as well to the Cymbalta  and given some Celebrex  as well of 100 mg twice a day.  Warned of potential reflux and discussed short-term using Prilosec over-the-counter.  Did have a translator with us  today.  Attempted some trigger point injections as well.  Hopeful that this will make some improvement.  Discussed icing regimen and home exercises and follow-up again in 6 to 8 weeks otherwise.      Update 07/04/2024 Taylor Parker is a 58 y.o. female coming in with complaint of lumbar spine and R shoulder pain.  At last exam patient was given trigger point injections in the right shoulder area.  Patient states that her shoulder is better after trigger points.   B CMC joint pain. Hard to open jars. Celebrex  is working well.        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 , LLE nonocclusive popliteal vein,   treated with coumadin,   History of kidney stones    Hyperlipidemia    Melasma 12/22/2022   OA (osteoarthritis)    hands,  knees   PMB (postmenopausal bleeding)    PMDD (premenstrual dysphoric disorder)    PONV (postoperative nausea and vomiting)     Pre-diabetes    Primary osteoarthritis of right knee 12/28/2019   Rosacea 12/22/2022   Varicose vein of leg    w/  vensous insuff;   hx ablation left GSV 2013 in [place];   left small SV laser ablation @[facility]  06-09-2017   Wears glasses    Past Surgical History:  Procedure Laterality Date   BLADDER SURGERY  12/09/2016   per pt sling   BREAST BIOPSY  06/19/2011   Procedure: BREAST BIOPSY;  Surgeon: Donnice Bury, MD;  Location: [facility];  Service: General;  Laterality: Right;  Right breast chronic abscess drainage and debridement   BREAST DUCTAL SYSTEM EXCISION Left 01/24/2014   Procedure:  LEFT BREAST DUCT EXCISION;  Surgeon: Donnice Bury, MD;  Location: [facility];  Service: General;  Laterality: Left;   CESAREAN SECTION Bilateral 03/15/2003   x3  first two in [place];  last one @[facility]  ;   WITH BILATERAL TUBAL LIGATION   COLONOSCOPY  08/15/2021   dr ernesto   DILATATION & CURETTAGE/HYSTEROSCOPY WITH MYOSURE N/A 05/10/2014   Procedure: DILATATION & CURETTAGE/HYSTEROSCOPY WITH MYOSURE ABLATION,RESECTOSCOPIC POLYPECTOMY;  Surgeon: Curlee VEAR Guan, MD;  Location: [facility];  Service: Gynecology;  Laterality: N/A;  Confirmed with Myosure rep to be present.   DILITATION & CURRETTAGE/HYSTROSCOPY WITH HYDROTHERMAL ABLATION N/A 06/16/2022   Procedure: DILATATION & CURETTAGE/HYSTEROSCOPY WITH HYDROTHERMAL ABLATION;  Surgeon: Alger Gong, MD;  Location: [facility];  Service: Gynecology;  Laterality: N/A;   INCISE AND DRAIN ABCESS Right 03/05/2011   @[facility]   by dr ebbie;   I&D right breast abscess and incisional bx   IR EMBO VENOUS NOT HEMORR HEMANG  INC GUIDE ROADMAPPING  06/09/2017   IR RADIOLOGIST EVAL & MGMT  05/04/2017   IR RADIOLOGIST EVAL & MGMT  06/29/2017   uterine biopsy  2023   VARICOSE VEIN SURGERY Bilateral 2013   in [place]   Social History   Socioeconomic History   Marital status: Married    Spouse name: Not on file   Number of children: 3   Years  of education: Not on file   Highest education level: 6th grade  Occupational History   Occupation: [employer]  7pm-4 am  Tobacco Use   Smoking status: Never    Passive exposure: Never   Smokeless tobacco: Never  Vaping Use   Vaping status: Never Used  Substance and Sexual Activity   Alcohol use: Not Currently   Drug use: Never   Sexual activity: Yes    Partners: Male    Birth control/protection: Surgical, Post-menopausal    Comment: 1st intercourse- 18, partners- 2, married- 15 yrs   Other Topics Concern   Not on file  Social History Narrative   Original from [place]   Houshold: pt, husband, son and G-son       Social Drivers of Health   Tobacco Use: Low Risk (05/18/2024)   Patient History    Smoking Tobacco Use: Never    Smokeless Tobacco Use: Never    Passive Exposure: Never  Financial Resource Strain: Patient Declined (04/22/2024)   Overall Financial Resource Strain (CARDIA)    Difficulty of Paying Living Expenses: Patient declined  Food Insecurity: No Food Insecurity (05/18/2024)   Epic    Worried About Programme Researcher, Broadcasting/film/video in the Last Year: Never true    Ran Out of Food in the Last Year: Never true  Transportation Needs: No Transportation Needs (05/18/2024)   Epic    Lack of Transportation (Medical): No    Lack of Transportation (Non-Medical): No  Physical Activity: Unknown (04/22/2024)   Exercise Vital Sign    Days of Exercise per Week: Patient declined    Minutes of Exercise per Session: Not on file  Stress: Patient Declined (04/22/2024)   [employer] of Occupational Health - Occupational Stress Questionnaire    Feeling of Stress: Patient declined  Social Connections: Unknown (04/22/2024)   Social Connection and Isolation Panel    Frequency of Communication with Friends and Family: Patient declined    Frequency of Social Gatherings with Friends and Family: Patient declined    Attends Religious Services: Patient declined    Active Member of Clubs or  Organizations: Patient declined    Attends Banker Meetings: Not on file    Marital Status: Married  Depression (PHQ2-9): Low Risk (06/29/2023)   Depression (PHQ2-9)    PHQ-2 Score: 3  Recent Concern: Depression (PHQ2-9) - Medium Risk (05/13/2023)   Depression (PHQ2-9)    PHQ-2 Score: 10  Alcohol Screen: Low Risk (04/22/2024)   Alcohol Screen    Last Alcohol Screening Score (AUDIT): 0  Housing: Patient Declined (04/22/2024)   Epic    Unable to Pay for Housing in the Last Year: Patient declined    Number of Times Moved in the Last Year: Not on file    Homeless in the Last Year: Patient  declined  Utilities: Not on file  Health Literacy: Not on file   Allergies[1] Family History  Problem Relation Age of Onset   Diabetes Mother    Heart disease Mother        pacemaker   Kidney failure Mother    High Cholesterol Brother        high TG   Healthy Daughter    Healthy Son    High Cholesterol Son    Healthy Son    Diabetes Other        gm   Colon cancer Neg Hx    Breast cancer Neg Hx    Esophageal cancer Neg Hx    Stomach cancer Neg Hx    Rectal cancer Neg Hx     Current Outpatient Medications (Analgesics):    celecoxib  (CELEBREX ) 100 MG capsule, Take 1 capsule (100 mg total) by mouth 2 (two) times daily.  Current Outpatient Medications (Hematological):    vitamin B-12 (CYANOCOBALAMIN) 100 MCG tablet, Take 100 mcg by mouth daily.  Current Outpatient Medications (Other):    ASHWAGANDHA PO, Take by mouth.   Cholecalciferol (D3 PO), Take by mouth.   gabapentin  (NEURONTIN ) 300 MG capsule, Take 1 capsule (300 mg total) by mouth at bedtime.   Reviewed prior external information including notes and imaging from  primary care provider As well as notes that were available from care everywhere and other healthcare systems.  Past medical history, social, surgical and family history all reviewed in electronic medical record.  No pertanent information unless stated  regarding to the chief complaint.   Review of Systems:  No headache, visual changes, nausea, vomiting, diarrhea, constipation, dizziness, abdominal pain, skin rash, fevers, chills, night sweats, weight loss, swollen lymph nodes, body aches, joint swelling, chest pain, shortness of breath, mood changes. POSITIVE muscle aches  Objective  Blood pressure 110/78, height 5' 1 (1.549 m), last menstrual period 08/31/2020.   General: No apparent distress alert and oriented x3 mood and affect normal, dressed appropriately.  HEENT: Pupils equal, extraocular movements intact  Respiratory: Patient's speak in full sentences and does not appear short of breath  Cardiovascular: No lower extremity edema, non tender, no erythema  Shoulder exam shows mild tenderness.  Patient having more pain in the wrist.  Positive Finkelstein bilaterally.   Procedure: Real-time Ultrasound Guided Injection of right Abductor pollicis longs tendon sheath Device: GE Logiq E  Ultrasound guided injection is preferred based studies that show increased duration, increased effect, greater accuracy, decreased procedural pain, increased response rate with ultrasound guided versus blind injection.  Verbal informed consent obtained.  Time-out conducted.  Noted no overlying erythema, induration, or other signs of local infection.  Skin prepped in a sterile fashion.  Local anesthesia: Topical Ethyl chloride.  With sterile technique and under real time ultrasound guidance:  tendon visualized.  23g 5/8 inch needle inserted distal to proximal approach into tendon sheath. Pictures taken  for needle placement. Patient did have injection of 0.5 cc of 0.5% Marcaine , and 0.5 cc of Kenalog  40 mg/dL. Completed without difficulty  Pain immediately resolved suggesting accurate placement of the medication.  Advised to call if fevers/chills, erythema, induration, drainage, or persistent bleeding.  Images permanently stored  Impression: Technically  successful ultrasound guided injection.  Procedure: Real-time Ultrasound Guided Injection of left Abductor pollicis longs tendon sheath Device: GE Logiq Q7  Ultrasound guided injection is preferred based studies that show increased duration, increased effect, greater accuracy, decreased procedural pain, increased response rate with ultrasound guided versus  blind injection.  Verbal informed consent obtained.  Time-out conducted.  Noted no overlying erythema, induration, or other signs of local infection.  Skin prepped in a sterile fashion.  Local anesthesia: Topical Ethyl chloride.  With sterile technique and under real time ultrasound guidance:  tendon visualized.  23g 5/8 inch needle inserted distal to proximal approach into tendon sheath. Pictures taken  for needle placement. Patient did have injection of 0.5 cc of of 0.5% Marcaine , and 0.5 cc of Kenalog  40 mg/dL. Completed without difficulty  Pain immediately resolved suggesting accurate placement of the medication.  Advised to call if fevers/chills, erythema, induration, drainage, or persistent bleeding.  Images permanently stored  Impression: Technically successful ultrasound guided injection.   Impression and Recommendations:     The above documentation has been reviewed and is accurate and complete Freeman Borba M Norbert Malkin, DO       [1] No Known Allergies  "

## 2024-07-04 ENCOUNTER — Encounter: Payer: Self-pay | Admitting: Family Medicine

## 2024-07-04 ENCOUNTER — Other Ambulatory Visit: Payer: Self-pay

## 2024-07-04 ENCOUNTER — Ambulatory Visit (INDEPENDENT_AMBULATORY_CARE_PROVIDER_SITE_OTHER): Payer: Self-pay | Admitting: Family Medicine

## 2024-07-04 VITALS — BP 110/78 | Ht 61.0 in

## 2024-07-04 DIAGNOSIS — M19012 Primary osteoarthritis, left shoulder: Secondary | ICD-10-CM

## 2024-07-04 DIAGNOSIS — M19011 Primary osteoarthritis, right shoulder: Secondary | ICD-10-CM

## 2024-07-04 DIAGNOSIS — M79644 Pain in right finger(s): Secondary | ICD-10-CM

## 2024-07-04 DIAGNOSIS — M654 Radial styloid tenosynovitis [de Quervain]: Secondary | ICD-10-CM

## 2024-07-04 DIAGNOSIS — M79645 Pain in left finger(s): Secondary | ICD-10-CM

## 2024-07-04 MED ORDER — CELECOXIB 100 MG PO CAPS: 100.0000 mg | ORAL_CAPSULE | Freq: Two times a day (BID) | ORAL | 0 refills | Status: AC

## 2024-07-04 NOTE — Assessment & Plan Note (Signed)
 Refilled Celebrex  that has been helping her shoulder pain and her knee pain

## 2024-07-04 NOTE — Patient Instructions (Addendum)
 Injected B thumb tendons today Wear brace at night 2 weeks Exercises Refill Celebrex  See me again 10-12 weeks

## 2024-07-04 NOTE — Assessment & Plan Note (Signed)
 Bilateral injections given today.  Patient had a big family event coming up and wanted to be a little more aggressive.  Discussed icing regimen, home exercises, and given a brace.  Discussed certain repetitive activity that could potentially cause more aggravation.  Follow-up again in 6 to 8 weeks

## 2024-07-27 ENCOUNTER — Ambulatory Visit: Payer: Self-pay | Admitting: Medical

## 2024-07-27 VITALS — BP 108/68 | HR 60 | Temp 98.0°F | Resp 15 | Ht 61.0 in | Wt 182.0 lb

## 2024-07-27 DIAGNOSIS — F419 Anxiety disorder, unspecified: Secondary | ICD-10-CM

## 2024-07-27 DIAGNOSIS — R5383 Other fatigue: Secondary | ICD-10-CM

## 2024-07-27 DIAGNOSIS — F32A Depression, unspecified: Secondary | ICD-10-CM

## 2024-07-27 DIAGNOSIS — K219 Gastro-esophageal reflux disease without esophagitis: Secondary | ICD-10-CM

## 2024-07-27 LAB — CBC WITH DIFFERENTIAL/PLATELET
Basophils Absolute: 0 K/uL (ref 0.0–0.1)
Basophils Relative: 0.9 % (ref 0.0–3.0)
Eosinophils Absolute: 0 K/uL (ref 0.0–0.7)
Eosinophils Relative: 1.2 % (ref 0.0–5.0)
HCT: 42.1 % (ref 36.0–46.0)
Hemoglobin: 14.1 g/dL (ref 12.0–15.0)
Lymphocytes Relative: 43.8 % (ref 12.0–46.0)
Lymphs Abs: 1.6 K/uL (ref 0.7–4.0)
MCHC: 33.6 g/dL (ref 30.0–36.0)
MCV: 93.5 fl (ref 78.0–100.0)
Monocytes Absolute: 0.3 K/uL (ref 0.1–1.0)
Monocytes Relative: 7 % (ref 3.0–12.0)
Neutro Abs: 1.7 K/uL (ref 1.4–7.7)
Neutrophils Relative %: 47.1 % (ref 43.0–77.0)
Platelets: 227 K/uL (ref 150.0–400.0)
RBC: 4.5 Mil/uL (ref 3.87–5.11)
RDW: 14 % (ref 11.5–15.5)
WBC: 3.6 K/uL — ABNORMAL LOW (ref 4.0–10.5)

## 2024-07-27 LAB — COMPREHENSIVE METABOLIC PANEL WITH GFR
ALT: 18 U/L (ref 3–35)
AST: 15 U/L (ref 5–37)
Albumin: 4 g/dL (ref 3.5–5.2)
Alkaline Phosphatase: 68 U/L (ref 39–117)
BUN: 10 mg/dL (ref 6–23)
CO2: 28 meq/L (ref 19–32)
Calcium: 8.9 mg/dL (ref 8.4–10.5)
Chloride: 108 meq/L (ref 96–112)
Creatinine, Ser: 0.67 mg/dL (ref 0.40–1.20)
GFR: 96.22 mL/min
Glucose, Bld: 95 mg/dL (ref 70–99)
Potassium: 4.6 meq/L (ref 3.5–5.1)
Sodium: 141 meq/L (ref 135–145)
Total Bilirubin: 0.3 mg/dL (ref 0.2–1.2)
Total Protein: 6.5 g/dL (ref 6.0–8.3)

## 2024-07-27 LAB — TSH: TSH: 0.81 u[IU]/mL (ref 0.35–5.50)

## 2024-07-27 LAB — VITAMIN B12: Vitamin B-12: >1500 pg/mL — ABNORMAL HIGH (ref 211–911)

## 2024-07-27 LAB — T4, FREE: Free T4: 1.06 ng/dL (ref 0.60–1.60)

## 2024-07-27 MED ORDER — DULOXETINE HCL 30 MG PO CPEP: 30.0000 mg | ORAL_CAPSULE | Freq: Every day | ORAL | 0 refills | Status: AC

## 2024-07-27 MED ORDER — OMEPRAZOLE 20 MG PO CPDR: 20.0000 mg | DELAYED_RELEASE_CAPSULE | Freq: Every day | ORAL | 3 refills | Status: AC

## 2024-07-27 MED ORDER — CLONAZEPAM 0.5 MG PO TABS: 0.5000 mg | ORAL_TABLET | Freq: Two times a day (BID) | ORAL | 0 refills | Status: AC | PRN

## 2024-07-27 NOTE — Patient Instructions (Addendum)
 Depression Symptoms include hopelessness and fatigue. Previous Cymbalta  use was effective. - Restart Cymbalta  30 mg daily, consider increase to 60 mg. - Follow-up in one month to assess response. - Advised emergency care for any recurrent suicidal thoughts or planning. Referral to ED today not indicated on discussion.  Generalized anxiety disorder High anxiety with GAD-7 score of 20. Cymbalta  previously effective. - Prescribed clonazepam  0.5 mg, six tablets, PRN for acute anxiety/panic attack, max two doses/day.  - Advised against driving or operating machinery post-clonazepam . Rx advisement.  Gastroesophageal reflux disease Symptoms possibly worsened by Celebrex . - Prescribed omeprazole  daily, especially with Celebrex . - Advised dietary modifications.  Fatigue Significant fatigue, possibly related to depression and medication. - Ordered CBC, metabolic panel, TSH, T4, B12 levels.  History of hepatic steatosis Last liver ultrasound four years ago. - Ordered liver function tests. - Consider repeat liver ultrasound if enzymes elevated.  Follow up one month or sooner if needed  Call rheumatologist office below to follow up on prior referral.    Referring To Provider Information Aldona Ziolkowska [address] [place] [address] [phone]  Referral Start Date: 04/27/2024

## 2024-07-27 NOTE — Progress Notes (Signed)
 "  Subjective:    Patient ID: Taylor Parker, female    DOB: 01-17-1966, 59 y.o.   MRN: [id number]  HPI  Taylor Parker is a 59 year old female who presents with symptoms of depression and anxiety.  Since early December she has had worsening depression and anxiety with poor sleep and concentration. Her PHQ-9 is 22 and GAD-7 is 20. She has intermittent thoughts of self-harm without plan or prior attempts. No current thoughts.  She previously used Cymbalta  for fibromyalgia pain and mood benefit. She now takes Celebrex  for joint pain, which controls pain but for 2 months has caused burning esophageal discomfort with eating and intermittent reflux.  She has significant fatigue and feels exhausted by midday. When she cannot complete tasks she becomes very frustrated and distressed.  She has fatty liver diagnosed about 4 years ago and is worried about liver health in the context of her current symptoms and medications.         Review of Systems  Constitutional:  Negative for chills and fatigue.  Respiratory:  Negative for cough, chest tightness and wheezing.   Cardiovascular:  Negative for chest pain and palpitations.  Gastrointestinal:  Negative for abdominal pain.  Genitourinary:  Negative for dysuria and frequency.  Musculoskeletal:  Negative for back pain.  Skin:  Negative for rash.  Neurological:  Negative for dizziness, weakness, light-headedness and headaches.  Psychiatric/Behavioral:  Positive for dysphoric mood. Negative for behavioral problems, decreased concentration and suicidal ideas. The patient is nervous/anxious.        No current thoughts of harm to self.    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 , LLE nonocclusive popliteal vein,    treated with coumadin,   History of kidney stones    Hyperlipidemia    Melasma 12/22/2022   OA (osteoarthritis)    hands,  knees   PMB (postmenopausal bleeding)    PMDD (premenstrual dysphoric disorder)    PONV (postoperative nausea and vomiting)    Pre-diabetes    Primary osteoarthritis of right knee 12/28/2019   Rosacea 12/22/2022   Varicose vein of leg    w/  vensous insuff;   hx ablation left GSV 2013 in [place];   left small SV laser ablation @[facility]  06-09-2017   Wears glasses      Social History   Socioeconomic History   Marital status: Married    Spouse name: Not on file   Number of children: 3   Years of education: Not on file   Highest education level: 6th grade  Occupational History   Occupation: [employer]  7pm-4 am  Tobacco Use   Smoking status: Never    Passive exposure: Never   Smokeless tobacco: Never  Vaping Use   Vaping status: Never Used  Substance and Sexual Activity   Alcohol use: Not Currently   Drug use: Never   Sexual activity: Yes    Partners: Male    Birth control/protection: Surgical, Post-menopausal    Comment: 1st intercourse- 18, partners- 2, married- 15 yrs   Other Topics Concern   Not on file  Social History Narrative   Original from [place]   Houshold: pt, husband, son and G-son  Social Drivers of Health   Tobacco Use: Low Risk (07/04/2024)   Patient History    Smoking Tobacco Use: Never    Smokeless Tobacco Use: Never    Passive Exposure: Never  Financial Resource Strain: Patient Declined (04/22/2024)   Overall Financial Resource Strain (CARDIA)    Difficulty of Paying Living Expenses: Patient declined  Food Insecurity: No Food Insecurity (05/18/2024)   Epic    Worried About Programme Researcher, Broadcasting/film/video in the Last Year: Never true    Ran Out of Food in the Last Year: Never true  Transportation Needs: No Transportation Needs (05/18/2024)   Epic    Lack of Transportation (Medical): No    Lack of Transportation (Non-Medical): No   Physical Activity: Unknown (04/22/2024)   Exercise Vital Sign    Days of Exercise per Week: Patient declined    Minutes of Exercise per Session: Not on file  Stress: Patient Declined (04/22/2024)   [employer] of Occupational Health - Occupational Stress Questionnaire    Feeling of Stress: Patient declined  Social Connections: Unknown (04/22/2024)   Social Connection and Isolation Panel    Frequency of Communication with Friends and Family: Patient declined    Frequency of Social Gatherings with Friends and Family: Patient declined    Attends Religious Services: Patient declined    Active Member of Clubs or Organizations: Patient declined    Attends Banker Meetings: Not on file    Marital Status: Married  Intimate Partner Violence: Not on file  Depression (PHQ2-9): High Risk (07/27/2024)   Depression (PHQ2-9)    PHQ-2 Score: 22  Alcohol Screen: Low Risk (04/22/2024)   Alcohol Screen    Last Alcohol Screening Score (AUDIT): 0  Housing: Patient Declined (04/22/2024)   Epic    Unable to Pay for Housing in the Last Year: Patient declined    Number of Times Moved in the Last Year: Not on file    Homeless in the Last Year: Patient declined  Utilities: Not on file  Health Literacy: Not on file    Past Surgical History:  Procedure Laterality Date   BLADDER SURGERY  12/09/2016   per pt sling   BREAST BIOPSY  06/19/2011   Procedure: BREAST BIOPSY;  Surgeon: Donnice Bury, MD;  Location: [facility];  Service: General;  Laterality: Right;  Right breast chronic abscess drainage and debridement   BREAST DUCTAL SYSTEM EXCISION Left 01/24/2014   Procedure:  LEFT BREAST DUCT EXCISION;  Surgeon: Donnice Bury, MD;  Location: [facility];  Service: General;  Laterality: Left;   CESAREAN SECTION Bilateral 03/15/2003   x3  first two in [place];  last one @[facility]  ;   WITH BILATERAL TUBAL LIGATION   COLONOSCOPY  08/15/2021   dr ernesto   DILATATION &  CURETTAGE/HYSTEROSCOPY WITH MYOSURE N/A 05/10/2014   Procedure: DILATATION & CURETTAGE/HYSTEROSCOPY WITH MYOSURE ABLATION,RESECTOSCOPIC POLYPECTOMY;  Surgeon: Curlee VEAR Guan, MD;  Location: [facility];  Service: Gynecology;  Laterality: N/A;  Confirmed with Myosure rep to be present.   DILITATION & CURRETTAGE/HYSTROSCOPY WITH HYDROTHERMAL ABLATION N/A 06/16/2022   Procedure: DILATATION & CURETTAGE/HYSTEROSCOPY WITH HYDROTHERMAL ABLATION;  Surgeon: Alger Gong, MD;  Location: [facility];  Service: Gynecology;  Laterality: N/A;   INCISE AND DRAIN ABCESS Right 03/05/2011   @[facility]   by dr bury;   I&D right breast abscess and incisional bx   IR EMBO VENOUS NOT HEMORR HEMANG  INC GUIDE ROADMAPPING  06/09/2017   IR RADIOLOGIST EVAL & MGMT  05/04/2017  IR RADIOLOGIST EVAL & MGMT  06/29/2017   uterine biopsy  2023   VARICOSE VEIN SURGERY Bilateral 2013   in [place]    Family History  Problem Relation Age of Onset   Diabetes Mother    Heart disease Mother        pacemaker   Kidney failure Mother    High Cholesterol Brother        high TG   Healthy Daughter    Healthy Son    High Cholesterol Son    Healthy Son    Diabetes Other        gm   Colon cancer Neg Hx    Breast cancer Neg Hx    Esophageal cancer Neg Hx    Stomach cancer Neg Hx    Rectal cancer Neg Hx     Allergies[1]  Medications Ordered Prior to Encounter[2]  BP 108/68   Pulse 60   Temp 98 F (36.7 C) (Oral)   Resp 15   Ht 5' 1 (1.549 m)   Wt 182 lb (82.6 kg)   LMP 08/31/2020   SpO2 97%   BMI 34.39 kg/m        Objective:   Physical Exam  General Mental Status- Alert. General Appearance- Not in acute distress.   Skin General: Color- Normal Color. Moisture- Normal Moisture.  Neck No JVD.  Chest and Lung Exam Auscultation: Breath Sounds:-CTA  Cardiovascular Auscultation:Rythm- RRR Murmurs & Other Heart Sounds:Auscultation of the heart reveals- No  Murmurs.  Abdomen Inspection:-Inspeection Normal. Palpation/Percussion:Note:No mass. Palpation and Percussion of the abdomen reveal- Non Tender, Non Distended + BS, no rebound or guarding.  Neurologic Cranial Nerve exam:- CN III-XII intact(No nystagmus), symmetric smile. Strength:- 5/5 equal and symmetric strength both upper and lower extremities.       Assessment & Plan:   Patient Instructions  Depression Symptoms include hopelessness and fatigue. Previous Cymbalta  use was effective. - Restart Cymbalta  30 mg daily, consider increase to 60 mg. - Follow-up in one month to assess response. - Advised emergency care for any recurrent suicidal thoughts or planning. Referral to ED today not indicated on discussion.  Generalized anxiety disorder High anxiety with GAD-7 score of 20. Cymbalta  previously effective. - Prescribed clonazepam  0.5 mg, six tablets, PRN for acute anxiety/panic attack, max two doses/day.  - Advised against driving or operating machinery post-clonazepam . Rx advisement.  Gastroesophageal reflux disease Symptoms possibly worsened by Celebrex . - Prescribed omeprazole  daily, especially with Celebrex . - Advised dietary modifications.  Fatigue Significant fatigue, possibly related to depression and medication. - Ordered CBC, metabolic panel, TSH, T4, B12 levels.  History of hepatic steatosis Last liver ultrasound four years ago. - Ordered liver function tests. - Consider repeat liver ultrasound if enzymes elevated.  Follow up one month or sooner if needed  Call rheumatologist office below to follow up on prior referral.    Referring To Provider Information Aldona Ziolkowska [address] [place] [address] [phone]  Referral Start Date: 04/27/2024     Dameian Crisman, PA-C     [1] No Known Allergies [2]  Current Outpatient Medications on File Prior to Visit  Medication Sig Dispense Refill   ASHWAGANDHA PO Take by mouth.     celecoxib   (CELEBREX ) 100 MG capsule Take 1 capsule (100 mg total) by mouth 2 (two) times daily. 180 capsule 0   Cholecalciferol (D3 PO) Take by mouth.     gabapentin  (NEURONTIN ) 300 MG capsule Take 1 capsule (300 mg total) by mouth at bedtime.  90 capsule 0   vitamin B-12 (CYANOCOBALAMIN) 100 MCG tablet Take 100 mcg by mouth daily.     No current facility-administered medications on file prior to visit.   "

## 2024-07-28 NOTE — Progress Notes (Signed)
 Called but no answer. Lvm in spanish

## 2024-08-03 LAB — VITAMIN B1: Vitamin B1 (Thiamine): 19 nmol/L (ref 8–30)

## 2024-08-28 ENCOUNTER — Ambulatory Visit: Payer: Self-pay | Admitting: Medical

## 2024-09-12 ENCOUNTER — Ambulatory Visit: Admitting: Family Medicine
